# Patient Record
Sex: Female | Born: 1970 | Race: Black or African American | Hispanic: No | Marital: Single | Smoking: Never smoker
Health system: Southern US, Community
[De-identification: ages and names within clinical notes are randomized; demographics above are authoritative.]

## PROBLEM LIST (undated history)

## (undated) ENCOUNTER — Inpatient Hospital Stay: Admission: EM | Payer: Self-pay | Source: Home / Self Care

## (undated) DIAGNOSIS — K219 Gastro-esophageal reflux disease without esophagitis: Secondary | ICD-10-CM

## (undated) DIAGNOSIS — K8020 Calculus of gallbladder without cholecystitis without obstruction: Secondary | ICD-10-CM

## (undated) DIAGNOSIS — F419 Anxiety disorder, unspecified: Secondary | ICD-10-CM

## (undated) DIAGNOSIS — E119 Type 2 diabetes mellitus without complications: Secondary | ICD-10-CM

## (undated) DIAGNOSIS — R42 Dizziness and giddiness: Secondary | ICD-10-CM

## (undated) DIAGNOSIS — I10 Essential (primary) hypertension: Secondary | ICD-10-CM

## (undated) DIAGNOSIS — D649 Anemia, unspecified: Secondary | ICD-10-CM

## (undated) DIAGNOSIS — Z9289 Personal history of other medical treatment: Secondary | ICD-10-CM

## (undated) DIAGNOSIS — T8859XA Other complications of anesthesia, initial encounter: Secondary | ICD-10-CM

## (undated) DIAGNOSIS — K529 Noninfective gastroenteritis and colitis, unspecified: Secondary | ICD-10-CM

## (undated) DIAGNOSIS — E785 Hyperlipidemia, unspecified: Secondary | ICD-10-CM

## (undated) DIAGNOSIS — E079 Disorder of thyroid, unspecified: Secondary | ICD-10-CM

## (undated) HISTORY — DX: Type 2 diabetes mellitus without complications: E11.9

## (undated) HISTORY — DX: Noninfective gastroenteritis and colitis, unspecified: K52.9

## (undated) HISTORY — PX: MYOMECTOMY: SHX85

## (undated) HISTORY — DX: Hyperlipidemia, unspecified: E78.5

## (undated) HISTORY — DX: Anxiety disorder, unspecified: F41.9

## (undated) HISTORY — DX: Dizziness and giddiness: R42

## (undated) HISTORY — DX: Gastro-esophageal reflux disease without esophagitis: K21.9

## (undated) HISTORY — DX: Calculus of gallbladder without cholecystitis without obstruction: K80.20

## (undated) HISTORY — DX: Personal history of other medical treatment: Z92.89

---

## 1998-09-19 ENCOUNTER — Ambulatory Visit (HOSPITAL_COMMUNITY): Admission: RE | Admit: 1998-09-19 | Discharge: 1998-09-19 | Payer: Self-pay | Admitting: *Deleted

## 2001-07-18 ENCOUNTER — Other Ambulatory Visit: Admission: RE | Admit: 2001-07-18 | Discharge: 2001-07-18 | Payer: Self-pay | Admitting: Obstetrics and Gynecology

## 2001-08-14 ENCOUNTER — Ambulatory Visit (HOSPITAL_COMMUNITY): Admission: RE | Admit: 2001-08-14 | Discharge: 2001-08-14 | Payer: Self-pay | Admitting: Internal Medicine

## 2001-09-15 ENCOUNTER — Encounter (INDEPENDENT_AMBULATORY_CARE_PROVIDER_SITE_OTHER): Payer: Self-pay | Admitting: Specialist

## 2001-09-15 ENCOUNTER — Inpatient Hospital Stay (HOSPITAL_COMMUNITY): Admission: RE | Admit: 2001-09-15 | Discharge: 2001-09-17 | Payer: Self-pay | Admitting: Obstetrics and Gynecology

## 2001-12-16 ENCOUNTER — Ambulatory Visit (HOSPITAL_COMMUNITY): Admission: RE | Admit: 2001-12-16 | Discharge: 2001-12-16 | Payer: Self-pay | Admitting: Obstetrics and Gynecology

## 2001-12-16 ENCOUNTER — Encounter: Payer: Self-pay | Admitting: Obstetrics and Gynecology

## 2002-07-21 ENCOUNTER — Other Ambulatory Visit: Admission: RE | Admit: 2002-07-21 | Discharge: 2002-07-21 | Payer: Self-pay | Admitting: Obstetrics and Gynecology

## 2003-01-11 ENCOUNTER — Emergency Department (HOSPITAL_COMMUNITY): Admission: EM | Admit: 2003-01-11 | Discharge: 2003-01-11 | Payer: Self-pay | Admitting: Emergency Medicine

## 2003-02-25 ENCOUNTER — Ambulatory Visit (HOSPITAL_COMMUNITY): Admission: RE | Admit: 2003-02-25 | Discharge: 2003-02-25 | Payer: Self-pay | Admitting: Chiropractor

## 2003-02-25 ENCOUNTER — Encounter: Payer: Self-pay | Admitting: Chiropractor

## 2003-07-28 ENCOUNTER — Other Ambulatory Visit: Admission: RE | Admit: 2003-07-28 | Discharge: 2003-07-28 | Payer: Self-pay | Admitting: Obstetrics and Gynecology

## 2004-09-22 ENCOUNTER — Ambulatory Visit (HOSPITAL_COMMUNITY): Admission: RE | Admit: 2004-09-22 | Discharge: 2004-09-22 | Payer: Self-pay | Admitting: Obstetrics and Gynecology

## 2004-10-31 ENCOUNTER — Encounter: Admission: RE | Admit: 2004-10-31 | Discharge: 2004-10-31 | Payer: Self-pay | Admitting: Obstetrics and Gynecology

## 2004-12-05 ENCOUNTER — Inpatient Hospital Stay (HOSPITAL_COMMUNITY): Admission: AD | Admit: 2004-12-05 | Discharge: 2004-12-05 | Payer: Self-pay | Admitting: Obstetrics and Gynecology

## 2004-12-26 ENCOUNTER — Inpatient Hospital Stay (HOSPITAL_COMMUNITY): Admission: AD | Admit: 2004-12-26 | Discharge: 2004-12-26 | Payer: Self-pay | Admitting: Obstetrics and Gynecology

## 2005-01-14 ENCOUNTER — Inpatient Hospital Stay (HOSPITAL_COMMUNITY): Admission: AD | Admit: 2005-01-14 | Discharge: 2005-01-17 | Payer: Self-pay | Admitting: Obstetrics & Gynecology

## 2005-01-14 ENCOUNTER — Encounter (INDEPENDENT_AMBULATORY_CARE_PROVIDER_SITE_OTHER): Payer: Self-pay | Admitting: Specialist

## 2005-01-21 ENCOUNTER — Emergency Department (HOSPITAL_COMMUNITY): Admission: EM | Admit: 2005-01-21 | Discharge: 2005-01-21 | Payer: Self-pay | Admitting: Emergency Medicine

## 2005-01-22 ENCOUNTER — Inpatient Hospital Stay (HOSPITAL_COMMUNITY): Admission: AD | Admit: 2005-01-22 | Discharge: 2005-01-24 | Payer: Self-pay | Admitting: Obstetrics and Gynecology

## 2006-03-20 ENCOUNTER — Emergency Department (HOSPITAL_COMMUNITY): Admission: EM | Admit: 2006-03-20 | Discharge: 2006-03-20 | Payer: Self-pay | Admitting: Emergency Medicine

## 2009-01-26 ENCOUNTER — Encounter: Admission: RE | Admit: 2009-01-26 | Discharge: 2009-01-26 | Payer: Self-pay | Admitting: Sports Medicine

## 2009-02-09 ENCOUNTER — Encounter: Admission: RE | Admit: 2009-02-09 | Discharge: 2009-02-09 | Payer: Self-pay | Admitting: Family Medicine

## 2009-12-14 ENCOUNTER — Ambulatory Visit: Payer: Self-pay | Admitting: Internal Medicine

## 2009-12-14 DIAGNOSIS — K219 Gastro-esophageal reflux disease without esophagitis: Secondary | ICD-10-CM

## 2009-12-14 DIAGNOSIS — I10 Essential (primary) hypertension: Secondary | ICD-10-CM | POA: Insufficient documentation

## 2009-12-14 DIAGNOSIS — E1165 Type 2 diabetes mellitus with hyperglycemia: Secondary | ICD-10-CM | POA: Insufficient documentation

## 2009-12-14 DIAGNOSIS — E039 Hypothyroidism, unspecified: Secondary | ICD-10-CM

## 2009-12-14 DIAGNOSIS — D649 Anemia, unspecified: Secondary | ICD-10-CM

## 2009-12-14 DIAGNOSIS — E119 Type 2 diabetes mellitus without complications: Secondary | ICD-10-CM

## 2009-12-14 LAB — CONVERTED CEMR LAB
BUN: 5 mg/dL — ABNORMAL LOW (ref 6–23)
Basophils Absolute: 0 10*3/uL (ref 0.0–0.1)
Bilirubin, Direct: 0.1 mg/dL (ref 0.0–0.3)
CO2: 30 meq/L (ref 19–32)
Chloride: 105 meq/L (ref 96–112)
Creatinine, Ser: 0.8 mg/dL (ref 0.4–1.2)
Eosinophils Absolute: 0 10*3/uL (ref 0.0–0.7)
Folate: 7 ng/mL
Hemoglobin, Urine: NEGATIVE
Ketones, ur: NEGATIVE mg/dL
MCHC: 31.9 g/dL (ref 30.0–36.0)
MCV: 75 fL — ABNORMAL LOW (ref 78.0–100.0)
Monocytes Absolute: 0.4 10*3/uL (ref 0.1–1.0)
Neutrophils Relative %: 72.9 % (ref 43.0–77.0)
Platelets: 409 10*3/uL — ABNORMAL HIGH (ref 150.0–400.0)
Saturation Ratios: 5.3 % — ABNORMAL LOW (ref 20.0–50.0)
Total Bilirubin: 0.3 mg/dL (ref 0.3–1.2)
Transferrin: 281.6 mg/dL (ref 212.0–360.0)
Urine Glucose: NEGATIVE mg/dL
Urobilinogen, UA: 0.2 (ref 0.0–1.0)
Vitamin B-12: 296 pg/mL (ref 211–911)
WBC: 8.5 10*3/uL (ref 4.5–10.5)

## 2009-12-16 ENCOUNTER — Telehealth: Payer: Self-pay | Admitting: Internal Medicine

## 2010-02-10 IMAGING — MG MM DIAGNOSTIC BILATERAL
7 series · 7 of 7 positions shown · non-contrast
Comparison: None

CLINICAL DATA: The patient feels a lump in the right axilla.  The
patient's physician felt a lump in the left axilla.

DIGITAL DIAGNOSTIC  BILATERAL  MAMMOGRAM  WITH CAD AND BILATERAL
BREAST ULTRASOUND:

[R CC]
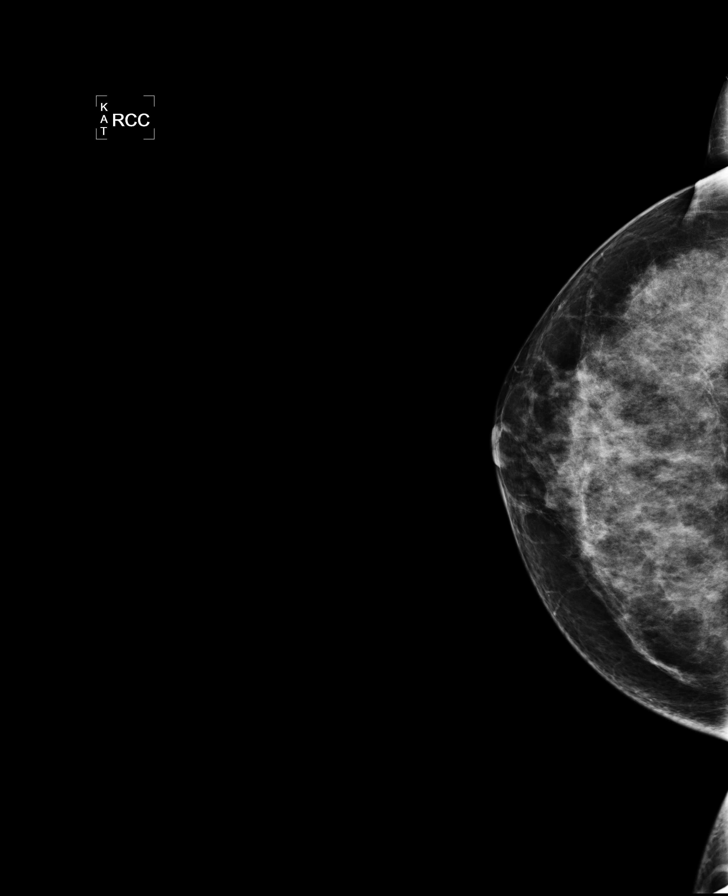

[L CC]
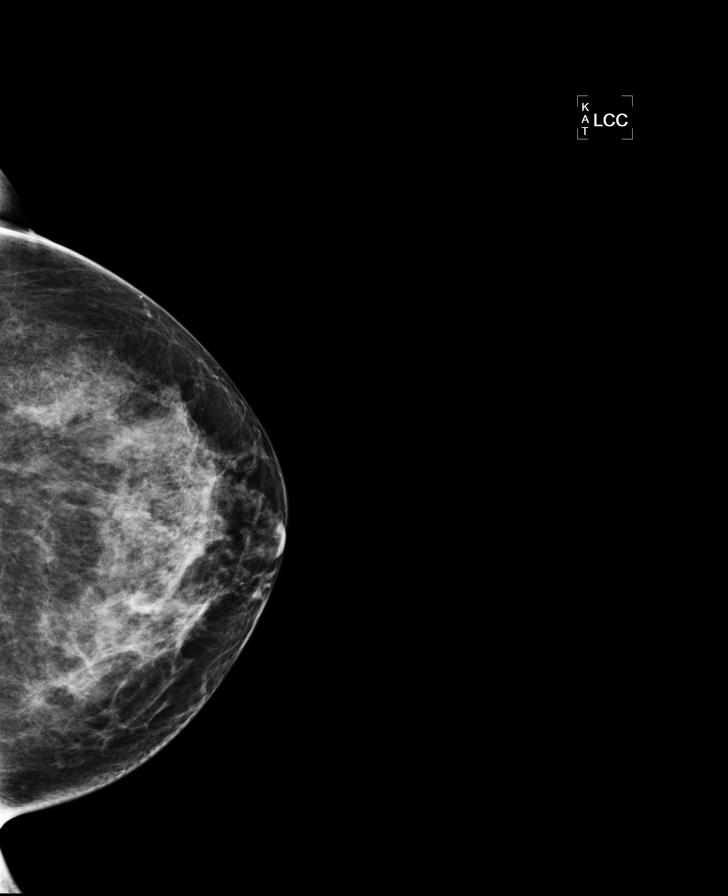

[L MLO (1 of 2)]
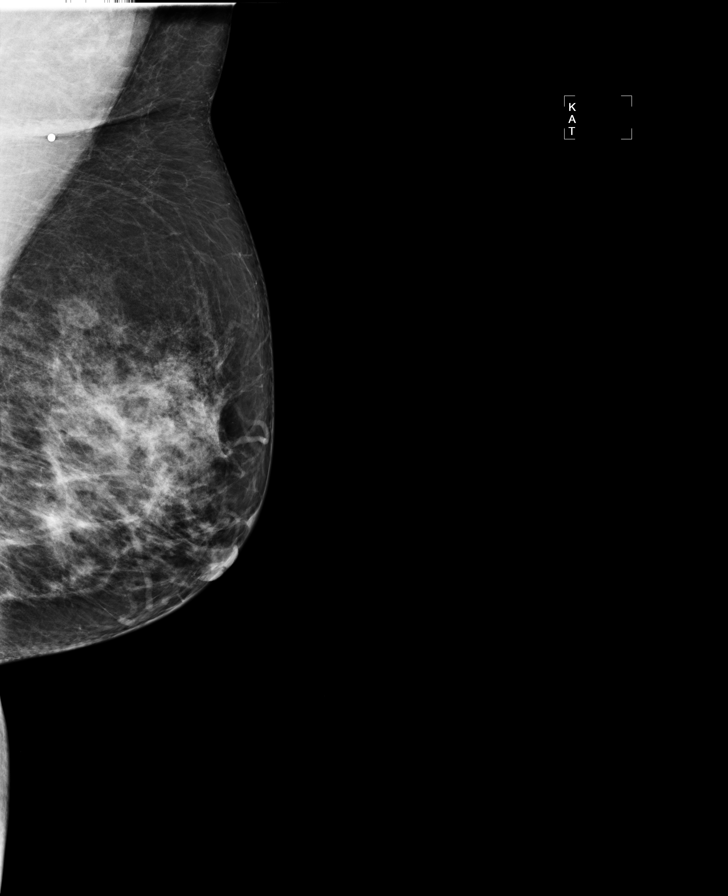

[R MLO]
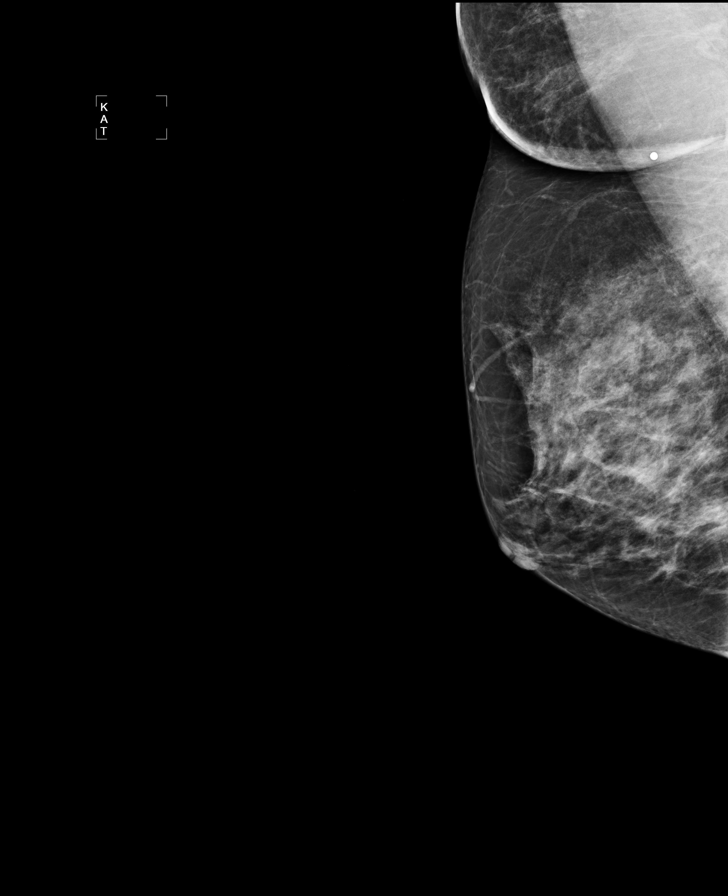

[L TAN]
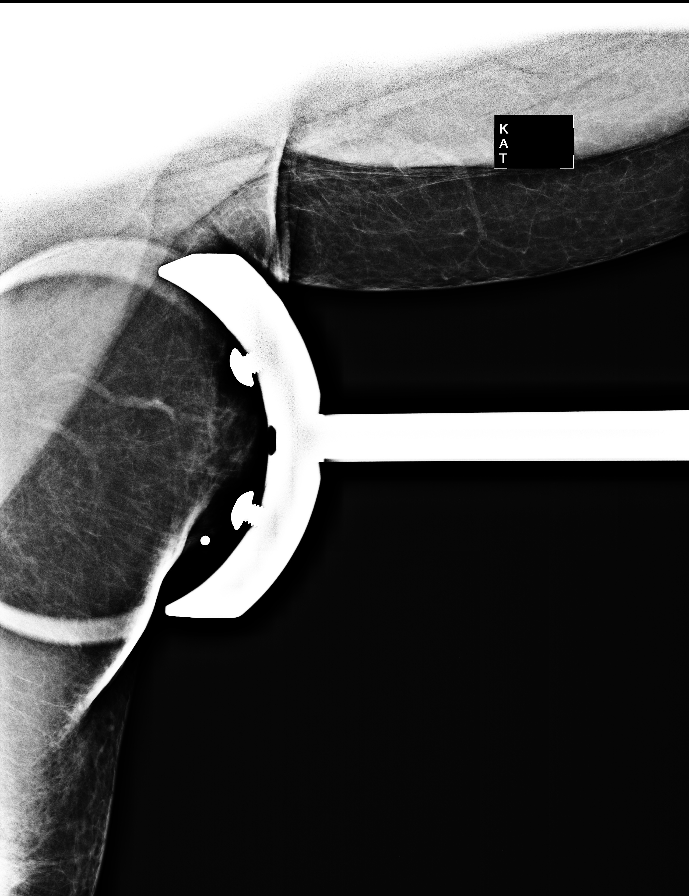

[R TAN]
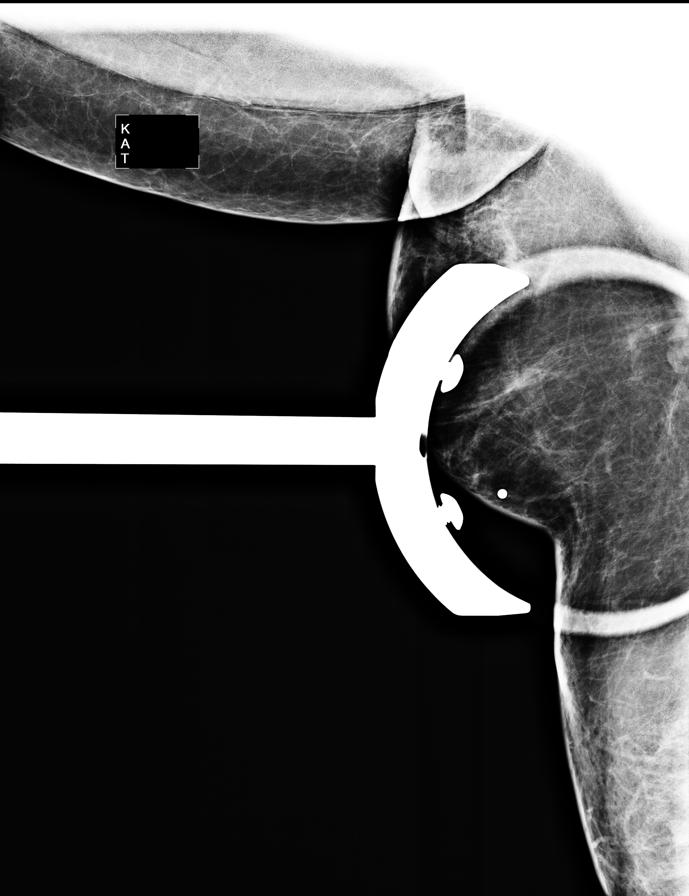

[L MLO (2 of 2)]
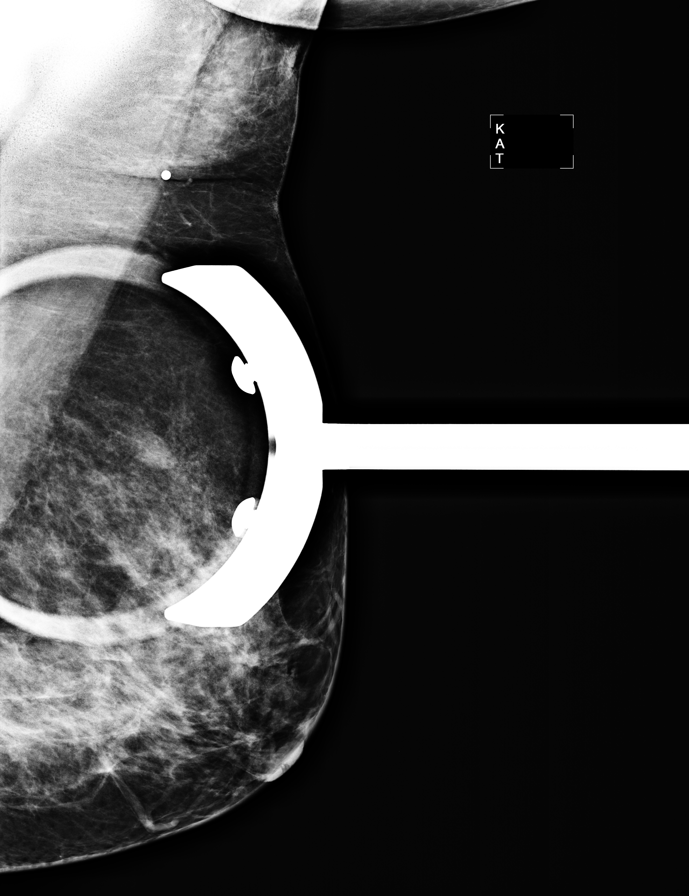

[7 of 7 positions shown; findings below may reference images not displayed]

FINDINGS: There are scattered fibroglandular densities.  There is
an oval nodule in the left upper outer quadrant.  No other
abnormality is noted.  In particular, spot compression views of the
areas of concern in each axilla demonstrates fatty tissue.

On physical exam, no mass is palpated in either axilla or in the
left upper outer quadrant.

Ultrasound is performed, showing fatty tissue in each axilla with
no mass, distortion or shadowing to suggest malignancy.  There is a
small cyst at 2 o'clock 7 cm from the left nipple measuring 1.2 x
0.6 x 1.1 cm.
IMPRESSION: No mammographic or sonographic evidence malignancy.  Yearly
screening mammography should begin at age 40 unless clinically
indicated earlier.

BI-RADS CATEGORY 2:  Benign finding(s).

## 2010-06-13 ENCOUNTER — Ambulatory Visit (HOSPITAL_COMMUNITY): Admission: RE | Admit: 2010-06-13 | Discharge: 2010-06-13 | Payer: Self-pay | Admitting: Diagnostic Radiology

## 2010-11-14 NOTE — Assessment & Plan Note (Signed)
Summary: NEW [employer] PT--PKG/OFF--STC   Vital Signs:  Patient profile:   40 year old female Menstrual status:  regular LMP:     11/30/2009 Height:      66 inches Weight:      190 pounds BMI:     30.78 O2 Sat:      99 % on Room air Temp:     98.7 degrees F oral Pulse rate:   88 / minute Pulse rhythm:   regular Resp:     16 per minute BP sitting:   122 / 88  (left arm) Cuff size:   large  Vitals Entered By: Rock Nephew CMA (December 14, 2009 10:05 AM)  Nutrition Counseling: Patient's BMI is greater than 25 and therefore counseled on weight management options.  O2 Flow:  Room air  Primary Care Provider:  Etta Grandchild MD  CC:  CPX w/no PAP, Hypertension Management, Abdominal Pain, and Preventive Care.  History of Present Illness: New to me she complains of several month hx. of dry mouth, inc. thirst, frequent urination,  blurry vision and numbness in fingers and toes. She ran out of thyroid medication several months ago.  Dyspepsia History:      She has no alarm features of dyspepsia including no history of melena, hematochezia, dysphagia, persistent vomiting, or involuntary weight loss > 5%.  There is a prior history of GERD.  The patient does not have a prior history of documented ulcer disease.  The dominant symptom is heartburn or acid reflux.  An H-2 blocker medication is not currently being taken.    Hypertension History:      She denies headache, chest pain, palpitations, dyspnea with exertion, orthopnea, peripheral edema, visual symptoms, and syncope.        Positive major cardiovascular risk factors include diabetes and hypertension.  Negative major cardiovascular risk factors include female age less than 49 years old, no history of hyperlipidemia, negative family history for ischemic heart disease, and non-tobacco-user status.        Further assessment for target organ damage reveals no history of ASHD, cardiac end-organ damage (CHF/LVH), stroke/TIA, peripheral vascular  disease, renal insufficiency, or hypertensive retinopathy.      Preventive Screening-Counseling & Management  Alcohol-Tobacco     Alcohol drinks/day: <1     Alcohol type: spirits     >5/day in last 3 mos: no     Alcohol Counseling: not indicated; use of alcohol is not excessive or problematic     Feels need to cut down: no     Feels annoyed by complaints: no     Feels guilty re: drinking: no     Needs 'eye opener' in am: no     Smoking Status: never  Caffeine-Diet-Exercise     Does Patient Exercise: yes      Sexual History:  not active.        Drug Use:  no.        Blood Transfusions:  no.    Allergies (verified): 1)  ! Biaxin  Past History:  Past Medical History: Anemia-NOS Diabetes mellitus, type II GERD Hypertension Hypothyroidism Palpitations with negative workup at [facility] in 2010  Past Surgical History: Caesarean section Myomectomy  Family History: Family History of Arthritis Family History Hypertension  Social History: Occupation: Neurosurgeon Single Never Smoked Alcohol use-yes Drug use-no Regular exercise-yes Smoking Status:  never Sexual History:  not active Blood Transfusions:  no Drug Use:  no Does Patient Exercise:  yes  Review  of Systems       The patient complains of weight gain.  The patient denies anorexia, fever, weight loss, chest pain, syncope, dyspnea on exertion, peripheral edema, prolonged cough, headaches, hemoptysis, abdominal pain, melena, hematochezia, severe indigestion/heartburn, hematuria, difficulty walking, depression, and angioedema.   CV:  Denies chest pain or discomfort, fainting, fatigue, near fainting, palpitations, shortness of breath with exertion, and swelling of feet. MS:  Denies joint pain, joint redness, joint swelling, low back pain, mid back pain, muscle aches, cramps, and muscle weakness. Neuro:  Complains of numbness; denies brief paralysis, difficulty with concentration,  disturbances in coordination, falling down, headaches, memory loss, poor balance, seizures, sensation of room spinning, tingling, tremors, and visual disturbances. Endo:  Complains of excessive hunger, excessive thirst, excessive urination, polyuria, and weight change; denies cold intolerance and heat intolerance. Heme:  Denies abnormal bruising, bleeding, enlarge lymph nodes, fevers, pallor, and skin discoloration.  Physical Exam  General:  alert, well-developed, well-nourished, well-hydrated, appropriate dress, normal appearance, healthy-appearing, cooperative to examination, and good hygiene.   Head:  normocephalic, atraumatic, no abnormalities observed, and no abnormalities palpated.   Eyes:  vision grossly intact, pupils equal, pupils round, pupils reactive to light, pupils react to accomodation, no retinal abnormalitiies, and no nystagmus.   Ears:  R ear normal and L ear normal.   Mouth:  Oral mucosa and oropharynx without lesions or exudates.  Teeth in good repair. Neck:  supple, full ROM, no masses, no thyromegaly, no thyroid nodules or tenderness, no JVD, no carotid bruits, no cervical lymphadenopathy, and no neck tenderness.   Lungs:  normal respiratory effort, no intercostal retractions, no accessory muscle use, normal breath sounds, no dullness, no fremitus, no crackles, and no wheezes.   Heart:  normal rate, regular rhythm, no murmur, no gallop, no rub, and no JVD.   Abdomen:  soft, non-tender, normal bowel sounds, no distention, no masses, no guarding, no rigidity, no rebound tenderness, no hepatomegaly, and no splenomegaly.   Msk:  No deformity or scoliosis noted of thoracic or lumbar spine.   Pulses:  R and L carotid,radial,femoral,dorsalis pedis and posterior tibial pulses are full and equal bilaterally Extremities:  No clubbing, cyanosis, edema, or deformity noted with normal full range of motion of all joints.   Neurologic:  No cranial nerve deficits noted. Station and gait are  normal. Plantar reflexes are down-going bilaterally. DTRs are symmetrical throughout. Sensory, motor and coordinative functions appear intact. Skin:  turgor normal, color normal, no rashes, no suspicious lesions, no ecchymoses, no petechiae, no purpura, no ulcerations, no edema, and tattoo(s).   Cervical Nodes:  no anterior cervical adenopathy and no posterior cervical adenopathy.   Axillary Nodes:  no R axillary adenopathy and no L axillary adenopathy.   Inguinal Nodes:  no R inguinal adenopathy and no L inguinal adenopathy.   Psych:  Cognition and judgment appear intact. Alert and cooperative with normal attention span and concentration. No apparent delusions, illusions, hallucinations  Diabetes Management Exam:    Foot Exam (with socks and/or shoes not present):       Sensory-Pinprick/Light touch:          Left medial foot (L-4): normal          Left dorsal foot (L-5): normal          Left lateral foot (S-1): normal          Right medial foot (L-4): normal          Right dorsal foot (L-5): normal  Right lateral foot (S-1): normal       Sensory-Monofilament:          Left foot: normal          Right foot: normal       Inspection:          Left foot: normal          Right foot: normal       Nails:          Left foot: normal          Right foot: normal   Impression & Recommendations:  Problem # 1:  HYPOTHYROIDISM (ICD-244.9) Assessment Unchanged  Orders: Venipuncture (40347) TLB-BMP (Basic Metabolic Panel-BMET) (80048-METABOL) TLB-CBC Platelet - w/Differential (85025-CBCD) TLB-Hepatic/Liver Function Pnl (80076-HEPATIC) TLB-TSH (Thyroid Stimulating Hormone) (84443-TSH) TLB-B12 + Folate Pnl (42595_63875-I43/PIR) TLB-IBC Pnl (Iron/FE;Transferrin) (83550-IBC) TLB-A1C / Hgb A1C (Glycohemoglobin) (83036-A1C) TLB-Udip w/ Micro (81001-URINE)  Problem # 2:  HYPERTENSION (ICD-401.9) Assessment: Improved  Orders: Venipuncture (51884) TLB-BMP (Basic Metabolic Panel-BMET)  (80048-METABOL) TLB-CBC Platelet - w/Differential (85025-CBCD) TLB-Hepatic/Liver Function Pnl (80076-HEPATIC) TLB-TSH (Thyroid Stimulating Hormone) (84443-TSH) TLB-B12 + Folate Pnl (16606_30160-F09/NAT) TLB-IBC Pnl (Iron/FE;Transferrin) (83550-IBC) TLB-A1C / Hgb A1C (Glycohemoglobin) (83036-A1C) TLB-Udip w/ Micro (81001-URINE)  BP today: 122/88  10 Yr Risk Heart Disease: Not enough information  Problem # 3:  DIABETES MELLITUS, TYPE II (ICD-250.00) Assessment: Unchanged  Orders: Venipuncture (55732) TLB-BMP (Basic Metabolic Panel-BMET) (80048-METABOL) TLB-CBC Platelet - w/Differential (85025-CBCD) TLB-Hepatic/Liver Function Pnl (80076-HEPATIC) TLB-TSH (Thyroid Stimulating Hormone) (84443-TSH) TLB-B12 + Folate Pnl (20254_27062-B76/EGB) TLB-IBC Pnl (Iron/FE;Transferrin) (83550-IBC) TLB-A1C / Hgb A1C (Glycohemoglobin) (83036-A1C) TLB-Udip w/ Micro (81001-URINE)  Problem # 4:  ANEMIA-NOS (ICD-285.9) Assessment: Unchanged  Orders: Venipuncture (15176) TLB-BMP (Basic Metabolic Panel-BMET) (80048-METABOL) TLB-CBC Platelet - w/Differential (85025-CBCD) TLB-Hepatic/Liver Function Pnl (80076-HEPATIC) TLB-TSH (Thyroid Stimulating Hormone) (84443-TSH) TLB-B12 + Folate Pnl (16073_71062-I94/WNI) TLB-IBC Pnl (Iron/FE;Transferrin) (83550-IBC) TLB-A1C / Hgb A1C (Glycohemoglobin) (83036-A1C) TLB-Udip w/ Micro (81001-URINE)  Hypertension Assessment/Plan:      The patient's hypertensive risk group is category C: Target organ damage and/or diabetes.  Today's blood pressure is 122/88.  Her blood pressure goal is < 130/80.  Colorectal Screening:  Colonoscopy Results:    Date of Exam: 02/19/2007    Results: Normal  PAP Screening:    Hx Cervical Dysplasia in last 5 yrs? No    3 normal PAP smears in last 5 yrs? Yes    Last PAP smear:  05/08/2009  Osteoporosis Risk Assessment:  Risk Factors for Fracture or Low Bone Density:   Smoking status:       never   Thyroid disease:      yes  Immunization & Chemoprophylaxis:    Tetanus vaccine: Historical  (10/16/1999)  Patient Instructions: 1)  Please schedule a follow-up appointment in 2 weeks. 2)  It is important that you exercise regularly at least 20 minutes 5 times a week. If you develop chest pain, have severe difficulty breathing, or feel very tired , stop exercising immediately and seek medical attention. 3)  You need to lose weight. Consider a lower calorie diet and regular exercise.   Preventive Care Screening  Pap Smear:    Date:  05/08/2009    Results:  normal   Last Tetanus Booster:    Date:  10/16/1999    Results:  Historical     Not Administered:    Influenza Vaccine not given due to: declined CC: CPX w/no PAP, Hypertension Management, Abdominal Pain, Preventive Care Is Patient Diabetic? No Pain Assessment Patient in pain? no  LMP (date): 11/30/2009     Menstrual Status regular Enter LMP: 11/30/2009 Last PAP Result normal    Prevention & Chronic Care Immunizations   Influenza vaccine: Not documented    Tetanus booster: 10/16/1999: Historical    Pneumococcal vaccine: Not documented  Other Screening   Pap smear: normal  (05/08/2009)   Smoking status: never  (12/14/2009)  Diabetes Mellitus   HgbA1C: Not documented    Eye exam: Not documented    Foot exam: yes  (12/14/2009)   High risk foot: Not documented   Foot care education: Not documented    Urine microalbumin/creatinine ratio: Not documented   Hypertension   Last Blood Pressure: 122 / 88  (12/14/2009)   Serum creatinine: Not documented   Serum potassium Not documented  Self-Management Support :    Diabetes self-management support: Not documented    Hypertension self-management support: Not documented

## 2010-11-14 NOTE — Letter (Signed)
Summary: Results Follow-up Letter  [facility]  [address] [place], [place] [address]   Phone: [phone]  Fax: [phone]    12/14/2009  [address] [place], [place]  [address]  Dear Ms. Schram,   The following are the results of your recent test(s):  Test     Result     Blood sugars   normal B12 level     normal Thyroid     normal Iron level     low CBC       anemia Liver/kidney   normal   _________________________________________________________  Please call for an appointment as directed _________________________________________________________ _________________________________________________________ _________________________________________________________  Sincerely,  Sanda Linger MD [facility]

## 2010-11-14 NOTE — Progress Notes (Signed)
Summary: Next apt  Phone Note Call from Patient   Summary of Call: Pt called req lab results. Informed her of results in letter. She wants to know why the Dr needs to see her again in 2 weeks. Pt says she can not afford a copay every 2 wks for f/u. Please advise, when does pt need next office visit?  Initial call taken by: Lamar Sprinkles, CMA,  December 16, 2009 10:35 AM  Follow-up for Phone Call        the f/up was to prescribe iron tabs. she can avoid the visit by seeing a pharmacist for iron recommendations Follow-up by: Etta Grandchild MD,  December 16, 2009 10:39 AM  Additional Follow-up for Phone Call Additional follow up Details #1::        Pt informed  Additional Follow-up by: Lamar Sprinkles, CMA,  December 16, 2009 10:44 AM

## 2011-02-07 ENCOUNTER — Encounter: Payer: BC Managed Care – PPO | Attending: Emergency Medicine

## 2011-02-07 DIAGNOSIS — Z713 Dietary counseling and surveillance: Secondary | ICD-10-CM | POA: Insufficient documentation

## 2011-02-07 DIAGNOSIS — E119 Type 2 diabetes mellitus without complications: Secondary | ICD-10-CM | POA: Insufficient documentation

## 2011-03-02 NOTE — Discharge Summary (Signed)
Janet Marquez, Janet Marquez             ACCOUNT NO.:  [id number]   MEDICAL RECORD NO.:  [id number]          PATIENT TYPE:  INP   LOCATION:  9107                          FACILITY:  [facility]   PHYSICIAN:  Genia Del, M.D.DATE OF BIRTH:  November 01, 1970   DATE OF ADMISSION:  01/14/2005  DATE OF DISCHARGE:  01/17/2005                                 DISCHARGE SUMMARY   ADMISSION DIAGNOSES:  1.  A 38-plus weeks' gestation.  2.  Spontaneous rupture of membranes.  3.  History of myomectomy.   DISCHARGE DIAGNOSES:  1.  A 38-plus weeks' gestation.  2.  Spontaneous rupture of membranes.  3.  History of myomectomy.  4.  Birth of a baby boy by cesarean section.   INTERVENTION:  Urgent primary low transverse cesarean section on January 14, 2005.   HOSPITAL COURSE:  The patient was admitted with spontaneous rupture of  membranes, having had a previous myomectomy.  A cesarean section was  planned.  The patient had an urgent primary low transverse cesarean section  with birth of a baby boy.  No complications.  The postop evolution was  unremarkable.  Her blood pressures remained stable and she remained  afebrile.  Postop hemoglobin was 7.1 with a hematocrit of 21.5.  the patient  was discharged home with iron supplement, Tylox p.r.n. for pain.  Postop  advice was given.  She will follow up at [facility] in 4 weeks.       ML/MEDQ  D:  03/12/2005  T:  03/12/2005  Job:  [id number]

## 2011-03-02 NOTE — Op Note (Signed)
Janet Marquez, Janet Marquez             ACCOUNT NO.:  [id number]   MEDICAL RECORD NO.:  [id number]          PATIENT TYPE:  INP   LOCATION:  9107                          FACILITY:  [facility]   PHYSICIAN:  Genia Del, M.D.DATE OF BIRTH:  [birth year]   DATE OF PROCEDURE:  01/14/2005  DATE OF DISCHARGE:                                 OPERATIVE REPORT   PREOPERATIVE DIAGNOSIS:  38+ weeks gestation, spontaneous rupture of  membranes, history of myomectomy.   POSTOPERATIVE DIAGNOSIS:  38+ weeks gestation, spontaneous rupture of  membranes, history of myomectomy.   PROCEDURE:  Urgent primary low transverse cesarean section.   SURGEON:  Genia Del, M.D.   ASSISTANT:  Maxie Better, M.D.   ANESTHESIA:  Dorinda Hill T. Pamalee Leyden, M.D.   DESCRIPTION OF PROCEDURE:  Under spinal anesthesia, the patient is in 15  degree left decubitus position.  She is prepped with Hibiclens with Betadine  on the abdominal, suprapubic, vulvar, and vaginal areas.  The bladder  catheter was inserted and the patient is draped as usual.  The level of  anesthesia is verified and is adequate.  We infiltrated the subcutaneous  tissue with Marcaine 0.25% plain 20 mL at the level of the previous scar  from the myomectomy.  We then resected the old scar with the scalpel and  opened the adipose tissue and the aponeurosis with the electrocautery  cutting mode.  We then extended that incision on the aponeurosis laterally  with Mayo scissors.  We separated the rectus muscles from the aponeurosis in  the midline superiorly and inferiorly.  We then opened the parietoperitoneum  longitudinally with Metzenbaum scissors.  We then opened the  visceroperitoneum transversely with Metzenbaum scissors over the lower  uterine segment and reclined the bladder downward.  The bladder retractor is  used at that time.  We then opened a hysterotomy over the lower uterine  segment transversely with the scalpel and this is extended on each  side with  the dressing scissors.  We have clear amniotic fluid.  The fetus is in  cephalic presentation, occipitoposterior.  Birth of a baby boy at 76.  The  cord is clamped and cut.  The baby is given to the neonatal team after  suctioning.  Apgars are 9 and 9.  Weight is 10 pounds 13 ounces.  We then  delivered the placenta spontaneously and sent it to pathology after taking  the cord blood.  We did a uterine revision.  The uterus contracts well with  Pitocin IV.  A dose of penicillin-G was given before the surgery because of  Group B Strep positive and rupture of membranes.  We now gave a dose of  Ancef 1 gram IV.  We closed the hysterotomy in first a full locked running  plain of Vicryl 0, a second plain in a mattress fashion is done with Vicryl  0.  Hemostasis is completed with a Vicryl 3-0 over a small vein in the  middle inferior border of the hysterotomy.  We then verified both ovaries  and both tubes which are normal in size and appearance.  The uterus is well  contracted, a little larger than average, but no distinct myoma is felt.  We  then irrigated the abdominopelvic cavity and removed any blood clots.  The  irrigation is suctioned.  Hemostasis is adequate on the aponeurosis and  rectus muscles as well as at the level of the bladder flap.  We closed the  aponeurosis with two half-running sutures of Vicryl 0.  The adipose tissue  is  verified and hemostasis is controlled with the electrocautery.  We then  reapproximated the skin with staples.  A dry dressing is applied.  The count  of sponges and instruments was complete x2.  The estimated blood loss was  800 mL.  No complications occurred and the patient was brought to the  recovery room in good status.      ML/MEDQ  D:  01/14/2005  T:  01/15/2005  Job:  [id number]

## 2011-03-02 NOTE — Op Note (Signed)
[facility]  Patient:    Janet Marquez, Janet Marquez Visit Number: [id number] MRN: [id number]          Service Type: GYN Location: 9300 [id number] Attending Physician:  Maxie Better Dictated by:   Sheria Lang. Cherly Hensen, M.D. Proc. Date: 09/15/01 Admit Date:  09/15/2001                             Operative Report  PREOPERATIVE DIAGNOSIS:       Degenerating uterine fibroid.  POSTOPERATIVE DIAGNOSIS:      Degenerating uterine fibroid.  OPERATION:                    Examination under anesthesia, exploratory                               laparotomy, myomectomy.  SURGEON:                      Sheronette A. Cherly Hensen, M.D.  ASSISTANT:                    Marina Gravel, M.D.  ANESTHESIA:                   General.  DESCRIPTION OF PROCEDURE:     Under adequate general anesthesia, the patient was placed in the supine position.  Examination under anesthesia revealed a uterus that was about 14 weeks size with a prominent anterior fibroid.  The patient was then thoroughly prepped including her vagina.  A bivalve speculum was placed in the vagina.  A single tooth tenaculum was placed on the anterior lip of the cervix.  An acorn cannula was introduced into the cervical os and attached to the tenaculum.  Through the acorn, a dilute solution of Indigo Carmine was attached for injection during this surgery.  The bivalve speculum was removed.  An indwelling Foley catheter had been sterilely placed. Antiembolic stockings were in place.  Antibiotic prophylaxis had been given. The patient was then sterilely draped in the usual fashion.  A marking pen was used to then outline the Pfannenstiel skin incision.  Quarter percent Marcaine was injected along this demarcated area, and a Pfannenstiel skin incision was then made, carried down to the rectus fascia using Bovie cautery.  The rectus fascia was incised in the midline and extended transversely.  The rectus fascia was then  bluntly and with cautery, dissected off the rectus muscles in a superior and inferior fashion.  The rectus muscle was split in the midline. Preperitoneal fat was then noted.  Subsequently, the parietal peritoneum was entered, and extended superiorly and inferiorly.  Exploration of the upper abdomen was notable for a normal liver edge, normal palpable kidney.  Normal appendix.  In the pelvis, there was a large, about 8 cm anterior fibroid noted.  Upon palpation of the ovaries, they were both noted to be normal.  The tubes appeared normal.  There was no evidence of endometriosis in the anterior or posterior cul-de-sac.  The bowels were then packed upwardly.  The uterus was exteriorized.  A dilute solution of Pitressin was injected vertically along the fibroid anteriorly.  Using Bovie cautery, a vertical incision was then made overlying the fibroid.  The fibroid was then enucleated from its base using Metzenbaum scissors.  At that point, the Indigo Carmine was injected. However, no evidence of  _________ of the tube or discoloration of the defect from which the fibroid had been removed.  There was however suggestion of the fibroid having a submucosal component.  The palpation of the uterus was then notable for a posterior fundal fibroid about 2.5 cm, but that fibroid could not be reached through the anterior incision.  Therefore, the anterior incision on the uterus was then closed with figure-of-eight 0 Vicryl sutures until the serosal surface was reached, at which time, 2-0 Vicryl was then used to close the incision in a baseball stitch.  Dilute Pitressin was then injected posteriorly overlying the fibroid.  An incision was then made.  The fibroid was enucleated from its base.  A figure-of-eight suture was used to close the dead space posteriorly, and 2-0 Monocryl suture was then used to close the serosa.  Good hemostasis was then noted.  The abdomen was then irrigated and suctioned of  debris.  The packings were removed.  Interceed was placed overlying the incision.  The parietal peritoneum was not closed.  The fascia was closed with 0 Vicryl x 2.  The subcutaneous area was irrigated.  Small bleeders cauterized.  The skin was approximated using Ethicon staples.  The specimen was myoma x 2 sent to pathology.  Estimated blood loss was 50 cc. Intraoperative fluid was 2600 cc of crystalloid.  Urine output was 100 cc, very cloudy urine with particulate matter.  A urine culture sample was sent. Sponge and instrument counts x 2 was correct.  Complications none.  The patient tolerated the procedure well and was transferred to the recovery room in stable condition.  It was noted as with the induction of anesthesia, the patient also had tachycardia.  Preanesthesia, her pulse had been 110, and this will be followed and evaluated postoperatively as well. Dictated by:   Sheria Lang. Cherly Hensen, M.D. Attending Physician:  Maxie Better DD:  09/15/01 TD:  09/16/01 Job: [id number]

## 2011-03-02 NOTE — H&P (Signed)
[facility]  Patient:    Janet Marquez, Janet Marquez Visit Number: [id number] MRN: [id number]          Service Type: Attending:  Sheronette A. Cherly Hensen, M.D. Dictated by:   Sheria Lang. Cherly Hensen, M.D. Adm. Date:  09/15/01                           History and Physical  DATE OF BIRTH:                July 07, 1971  CHIEF COMPLAINT:              Degenerating fibroids.  HISTORY OF PRESENT ILLNESS:   This is a 40 year old, gravida 0, single black female whose last menstrual period is August 31, 2001, who is now being admitted for myomectomy secondary to degenerating fibroids.  The patient underwent an ultrasound on July 18, 2001 secondary to metrorrhagia.  At that time, there was a finding of a 7.8 cm anterior fibroid and questions of a thickened endometrium with possible endometrial polyps.  There was a small left ovarian cyst and a right ovary that was normal.  Sonohysterogram done on August 08, 2001 showed a thickened anterior and posterior wall without any concrete evidence of endometrial polyp.  The patient was first seen on July 07, 2001.  At that time, she was complaining of irregular cycles. The patient reported regular cycles until 1999 to around year 2000.  Then, her cycles began to occur every two weeks.  She was placed on birth control pills for six months and then her cycles became regular after that until June of 2002, at which time, she started to have two menstrual cycles per month.  She also noted that each of these vaginal bleeding had lasted seven days.  There were associated cramps and some dime-sized clots.  The patient has not used any contraception for greater than a year, with the same partner for three years, who also has a child outside of the relationship.  She has no history of thyroid disease and denied any history of pelvic inflammatory disease or sexually transmitted diseases.  The patient subsequently presented on  August 05, 2001 with right lower quadrant pain, which was described as severe, constant, radiating to her lower back.  At the time that she presented, her exam was more consistent with a degenerating fibroid where her pain was localized over the fibroid itself.  The patient was treated with analgesics. She continues to have some degree of right lower quadrant pain overlying the fibroid.  She now presents for surgical management.  ALLERGIES:                    No known drug allergies.  MEDICATIONS:                  None.  PAST MEDICAL HISTORY:         None.  SURGICAL HISTORY:             Wisdom tooth extraction.  FAMILY HISTORY:               Maternal grandfather with diabetes.  Aunt with postmenopausal breast cancer.  Mother has hypertension.  SOCIAL HISTORY:               Single.  She is an eligibility case worker and her significant other is in the [employer].  REVIEW OF SYSTEMS:  Negative, except for history of present illness.  PHYSICAL EXAMINATION:  GENERAL:                      She is a well-developed, well-nourished black female in no acute distress.  VITAL SIGNS:                  Blood pressure 118/84, weight of 184 pounds.  SKIN:                         No lesions.  HEENT:                        Anicteric sclerae, pink conjunctivae. Oropharynx negative.  HEART:                        Regular rate and rhythm, without murmur.  LUNGS:                        Clear to auscultation.  No axillary or supraclavicular nodes palpable.  No inguinal nodes palpable.  BREASTS:                      Soft, nontender.  No palpable mass.  Upward nipples without discharge.  ABDOMEN:                      Soft, nondistended.  Tender to deep palpation in right lower quadrant.  BACK:                         No CVA tenderness.  PELVIC:                       Vulva showed no lesions. Vagina had some white discharge for which wet prep was performed.  Cervix was closed.  Uterus  was anteverted with a palpable 6 cm anterior fundal mass, consistent with a fibroid.  Adnexa was without any palpable mass.  RECTAL:                       Not indicated.  LABORATORY/ACCESSORY DATA:    CBC done on August 05, 2001 showed a hemoglobin of 10.7, hematocrit of 33.1.  Thyroid test on July 18, 2001 showed was normal.  GC and Chlamydia cultures were negative on July 18, 2001.  Pap was normal on July 18, 2001.  Wet prep showed yeast and clue cells.  IMPRESSION:                   Degenerating fibroid.  PLAN:                         1. Admission.                               2. Myomectomy.                               3. Possible chromotubation.                               4. Antibiotics prophylaxis.  5. Anti-embolic stockings.  Risks of the procedure have been explained to the patient, including, but not limited to infection, bleeding which may require blood transfusion, acute reaction.  Hepatitis transmission, HIV transmission all discussed.  Possible need for cesarean section in the future.  Up to 30% chance of her needing a myomectomy or hysterectomy in the future.  Internal scar tissue which may result in secondary infertility, as well as bowel obstruction, possible pelvic pain.  Injuries to surrounding organ structure, including bowel, bladder, intestines, ureter.  The patient was treated for bacterial vaginosis, using Cleocin vaginal ovules and Diflucan for the Candida vaginitis.  Postoperative care and criteria for discharge were all discussed.  All questions answered. Dictated by:   Sheria Lang. Cherly Hensen, M.D. Attending:  Sheronette A. Cherly Hensen, M.D. DD:  09/14/01 TD:  09/14/01 Job: [id number]

## 2011-03-02 NOTE — Discharge Summary (Signed)
[facility]  Patient:    JUDI, JAFFE Visit Number: [id number] MRN: [id number]          Service Type: GYN Location: 9300 [id number] Attending Physician:  Maxie Better Dictated by:   Sheria Lang. Cherly Hensen, M.D. Admit Date:  09/15/2001 Discharge Date: 09/17/2001                             Discharge Summary  ADMISSION DIAGNOSES: 1. Degenerating fibroid. 2. Menorrhagia.  DISCHARGE DIAGNOSES: 1. Degenerating fibroid. 2. Menorrhagia.  PROCEDURE:  Examination under anesthesia, exploratory laparotomy, and myomectomy.  HOSPITAL COURSE:  The patient was admitted as a 40 year old G0, single black female with a large fibroid uterus and abdominal pain related to her fibroids for surgical management.  The patient was taken to the operating room where she underwent an examination under anesthesia, exploratory laparotomy, and myomectomy.  Her procedure was unremarkable.  During her surgery, the patient was noted to have sinus tachycardia.  Evaluation, including thyroid studies postoperatively were normal.  The patient had a normal TSH on July 18, 2001. Urine culture had been sent due to a cloudy urine noted postoperatively, however, the urine culture was subsequently negative.  Her CBC on postoperative day #1, showed a hemoglobin of 9.0, hematocrit of 27.1, white count of 13.  The patient remained afebrile throughout her course.  She tolerated a regular diet, and she was deemed well to be discharged home on postoperative day #2, having passed flatus.  Her incision was without any erythema, induration, or exudate.  It did have staples.  DISPOSITION:  Home.  DISCHARGE INSTRUCTIONS: 1. Call for temperature greater than or equal to 100.4. 2. Nothing per vagina for 4 to 6 weeks. 3. No heavy lifting or driving for two weeks. 4. Call if increased incisional pain, redness, or drainage from the incision    site.  FOLLOWUP:  Staple removal in the  office on 12/6 or 12/9, depending on whether or not there is snow.  DISCHARGE MEDICATIONS: 1. Tylox one or two tablets q.3-4h. p.r.n. pain #25. 2. Motrin 800 mg one p.o. q.6h. p.r.n. pain #30 with two refills. 3. Take an iron supplementation one p.o. q.d.  CONDITION ON DISCHARGE:  Stable. Dictated by:   Sheria Lang. Cherly Hensen, M.D. Attending Physician:  Maxie Better DD:  09/17/01 TD:  09/17/01 Job: [id number]

## 2013-01-07 ENCOUNTER — Other Ambulatory Visit: Payer: Self-pay

## 2013-01-07 DIAGNOSIS — Z1231 Encounter for screening mammogram for malignant neoplasm of breast: Secondary | ICD-10-CM

## 2013-02-02 ENCOUNTER — Ambulatory Visit
Admission: RE | Admit: 2013-02-02 | Discharge: 2013-02-02 | Disposition: A | Discharge status: Home / Self Care | Payer: BC Managed Care – PPO | Source: Ambulatory Visit

## 2013-02-02 DIAGNOSIS — Z1231 Encounter for screening mammogram for malignant neoplasm of breast: Secondary | ICD-10-CM

## 2013-02-03 ENCOUNTER — Other Ambulatory Visit: Payer: Self-pay | Admitting: Internal Medicine

## 2013-02-03 DIAGNOSIS — R928 Other abnormal and inconclusive findings on diagnostic imaging of breast: Secondary | ICD-10-CM

## 2013-02-19 ENCOUNTER — Ambulatory Visit
Admission: RE | Admit: 2013-02-19 | Discharge: 2013-02-19 | Disposition: A | Discharge status: Home / Self Care | Payer: BC Managed Care – PPO | Source: Ambulatory Visit | Attending: Internal Medicine | Admitting: Internal Medicine

## 2013-02-19 DIAGNOSIS — R928 Other abnormal and inconclusive findings on diagnostic imaging of breast: Secondary | ICD-10-CM

## 2013-09-30 ENCOUNTER — Other Ambulatory Visit: Payer: Self-pay | Admitting: Internal Medicine

## 2013-09-30 DIAGNOSIS — N6320 Unspecified lump in the left breast, unspecified quadrant: Secondary | ICD-10-CM

## 2013-10-09 ENCOUNTER — Ambulatory Visit
Admission: RE | Admit: 2013-10-09 | Discharge: 2013-10-09 | Disposition: A | Discharge status: Home / Self Care | Payer: BC Managed Care – PPO | Source: Ambulatory Visit | Attending: Internal Medicine | Admitting: Internal Medicine

## 2013-10-09 DIAGNOSIS — N6320 Unspecified lump in the left breast, unspecified quadrant: Secondary | ICD-10-CM

## 2014-05-11 ENCOUNTER — Other Ambulatory Visit: Payer: Self-pay | Admitting: Internal Medicine

## 2014-05-11 DIAGNOSIS — N6320 Unspecified lump in the left breast, unspecified quadrant: Secondary | ICD-10-CM

## 2014-05-21 ENCOUNTER — Ambulatory Visit
Admission: RE | Admit: 2014-05-21 | Discharge: 2014-05-21 | Disposition: A | Discharge status: Home / Self Care | Payer: BC Managed Care – PPO | Source: Ambulatory Visit | Attending: Internal Medicine | Admitting: Internal Medicine

## 2014-05-21 ENCOUNTER — Encounter (INDEPENDENT_AMBULATORY_CARE_PROVIDER_SITE_OTHER): Payer: Self-pay

## 2014-05-21 DIAGNOSIS — N6320 Unspecified lump in the left breast, unspecified quadrant: Secondary | ICD-10-CM

## 2014-08-07 ENCOUNTER — Telehealth: Payer: Self-pay

## 2014-08-07 ENCOUNTER — Ambulatory Visit (INDEPENDENT_AMBULATORY_CARE_PROVIDER_SITE_OTHER): Payer: BC Managed Care – PPO | Admitting: Family Medicine

## 2014-08-07 VITALS — BP 132/86 | HR 107 | Temp 98.7°F | Resp 18 | Ht 67.0 in | Wt 199.8 lb

## 2014-08-07 DIAGNOSIS — R05 Cough: Secondary | ICD-10-CM

## 2014-08-07 DIAGNOSIS — J0101 Acute recurrent maxillary sinusitis: Secondary | ICD-10-CM

## 2014-08-07 DIAGNOSIS — R058 Other specified cough: Secondary | ICD-10-CM

## 2014-08-07 MED ORDER — RANITIDINE HCL 150 MG PO CAPS: 150.0000 mg | ORAL_CAPSULE | Freq: Every evening | ORAL | Status: DC

## 2014-08-07 MED ORDER — MOMETASONE FUROATE 50 MCG/ACT NA SUSP: 2.0000 | Freq: Every day | NASAL | Status: DC

## 2014-08-07 MED ORDER — LEVOFLOXACIN 500 MG PO TABS: 500.0000 mg | ORAL_TABLET | Freq: Every day | ORAL | Status: DC

## 2014-08-07 NOTE — Telephone Encounter (Signed)
Patient states that her Nasonax is $230.00. Cannot afford. Please advise. [place] [facility] [place]  [phone]

## 2014-08-07 NOTE — Progress Notes (Signed)
This chart was scribed for Tami Lin, MD by Erling Conte, Medical Scribe. This patient was seen in Room 6 and the patient's care was started at 10:01 AM.   Patient ID: Janet Marquez MRN: [id number], DOB: 02-04-1971, 43 y.o. Date of Encounter: 08/07/2014, 9:58 AM  Primary Physician: Scarlette Calico, MD  Chief Complaint:  Chief Complaint  Patient presents with   Headache    facial pressure, x 2-3 weeks   Cough    chest pain with cough, yellow gray cloudy phlegm   Nasal Congestion   Otalgia    more on right side    HPI: 42 y.o. year old female presents with 3 week history of nasal congestion. She has an associated post nasal drip, HA, sinus pressure, otalgia, rhinorrhea, and cough productive of yellow, gray, cloudy mucus. Afebrile. No chills. Nasal congestion thick and green/yellow. Sinus pressure is the worst symptom. Cough is productive secondary to post nasal drip and is greater at night. Ears feel full, right side greater than left. Has tried OTC cold preps without much success.Has tried Flonase in the past with moderate relief. No GI complaints. She has a h/o seasonal allergies. No h/o asthma. She has frequent sinus infections. She denies any sore throat, rash, nausea, or diarrhea.   No recent antibiotics, recent travels, vomiting, or sick contacts   No leg trauma, sedentary periods, h/o cancer, or tobacco use.  Pt works for [employer]  No past medical history on file.   Home Meds: Prior to Admission medications   Not on File    Allergies:  Allergies  Allergen Reactions   Clarithromycin     REACTION: Vomiting Upset GI    History   Social History   Marital Status: Single    Spouse Name: N/A    Number of Children: N/A   Years of Education: N/A   Occupational History   Not on file.   Social History Main Topics   Smoking status: Not on file   Smokeless tobacco: Not on file   Alcohol Use: Not on file   Drug Use: Not on file    Sexual Activity: Not on file   Other Topics Concern   Not on file   Social History Narrative   No narrative on file     Review of Systems: Constitutional: negative for chills, fever, night sweats or weight changes Cardiovascular: negative for chest pain or palpitations HENT: Positive for congestion, sinus pressure, post nasal drip, otalgia (R > L). Negative for sore throat Respiratory: positive for cough, negative for hemoptysis, wheezing, or shortness of breath Abdominal: negative for abdominal pain, nausea, vomiting or diarrhea Dermatological: negative for rash Neurologic: Positive for HA (due to sinus pressure)   Physical Exam: Blood pressure 132/86, pulse 107, temperature 98.7 F (37.1 C), temperature source Oral, resp. rate 18, height 5\' 7"  (1.702 m), weight 199 lb 12.8 oz (90.629 kg), SpO2 100.00%., Body mass index is 31.29 kg/(m^2). General: Well developed, well nourished, in no acute distress. Head: Normocephalic, atraumatic, eyes without discharge, sclera non-icteric, nares are congested. Swollen nasal passages with mucoperulent discharge. Bilateral auditory canals clear, TM's are without perforation, pearly grey with reflective cone of light bilaterally. Serous effusion bilaterally behind TM's. Maxillary sinus TTP. Oral cavity moist, dentition normal. Posterior pharynx with post nasal drip and mild erythema. No peritonsillar abscess or tonsillar exudate.  Neck: Supple. No thyromegaly. Full ROM. No lymphadenopathy. Lungs: Clear bilaterally to auscultation without wheezes, rales, or rhonchi. Breathing is unlabored.  Heart: RRR with  S1 S2. No murmurs, rubs, or gallops appreciated. Msk:  Strength and tone normal for age. Extremities: No clubbing or cyanosis. No edema. Neuro: Alert and oriented X 3. Moves all extremities spontaneously. CNII-XII grossly in tact. Psych:  Responds to questions appropriately with a normal affect.   Labs:   ASSESSMENT AND PLAN:  43 y.o. year old  female with sinusitis -Acute recurrent maxillary sinusitis - Plan: mometasone (NASONEX) 50 MCG/ACT nasal spray, levofloxacin (LEVAQUIN) 500 MG tablet  Nocturnal cough - Plan: ranitidine (ZANTAC) 150 MG capsule    -Tylenol/Motrin prn -Rest/fluids -RTC precautions -RTC 3-5 days if no improvement  Signed, Robyn Haber, MD 08/07/2014 9:58 AM  I personally performed the services described in this documentation, which was scribed in my presence. The recorded information has been reviewed and is accurate.

## 2014-08-07 NOTE — Patient Instructions (Signed)
Take probiotic with the Levaquin Call if nocturnal cough persists   Sinusitis Sinusitis is redness, soreness, and inflammation of the paranasal sinuses. Paranasal sinuses are air pockets within the bones of your face (beneath the eyes, the middle of the forehead, or above the eyes). In healthy paranasal sinuses, mucus is able to drain out, and air is able to circulate through them by way of your nose. However, when your paranasal sinuses are inflamed, mucus and air can become trapped. This can allow bacteria and other germs to grow and cause infection. Sinusitis can develop quickly and last only a short time (acute) or continue over a long period (chronic). Sinusitis that lasts for more than 12 weeks is considered chronic.  CAUSES  Causes of sinusitis include:  Allergies.  Structural abnormalities, such as displacement of the cartilage that separates your nostrils (deviated septum), which can decrease the air flow through your nose and sinuses and affect sinus drainage.  Functional abnormalities, such as when the small hairs (cilia) that line your sinuses and help remove mucus do not work properly or are not present. SIGNS AND SYMPTOMS  Symptoms of acute and chronic sinusitis are the same. The primary symptoms are pain and pressure around the affected sinuses. Other symptoms include:  Upper toothache.  Earache.  Headache.  Bad breath.  Decreased sense of smell and taste.  A cough, which worsens when you are lying flat.  Fatigue.  Fever.  Thick drainage from your nose, which often is green and may contain pus (purulent).  Swelling and warmth over the affected sinuses. DIAGNOSIS  Your health care provider will perform a physical exam. During the exam, your health care provider may:  Look in your nose for signs of abnormal growths in your nostrils (nasal polyps).  Tap over the affected sinus to check for signs of infection.  View the inside of your sinuses (endoscopy) using  an imaging device that has a light attached (endoscope). If your health care provider suspects that you have chronic sinusitis, one or more of the following tests may be recommended:  Allergy tests.  Nasal culture. A sample of mucus is taken from your nose, sent to a lab, and screened for bacteria.  Nasal cytology. A sample of mucus is taken from your nose and examined by your health care provider to determine if your sinusitis is related to an allergy. TREATMENT  Most cases of acute sinusitis are related to a viral infection and will resolve on their own within 10 days. Sometimes medicines are prescribed to help relieve symptoms (pain medicine, decongestants, nasal steroid sprays, or saline sprays).  However, for sinusitis related to a bacterial infection, your health care provider will prescribe antibiotic medicines. These are medicines that will help kill the bacteria causing the infection.  Rarely, sinusitis is caused by a fungal infection. In theses cases, your health care provider will prescribe antifungal medicine. For some cases of chronic sinusitis, surgery is needed. Generally, these are cases in which sinusitis recurs more than 3 times per year, despite other treatments. HOME CARE INSTRUCTIONS   Drink plenty of water. Water helps thin the mucus so your sinuses can drain more easily.  Use a humidifier.  Inhale steam 3 to 4 times a day (for example, sit in the bathroom with the shower running).  Apply a warm, moist washcloth to your face 3 to 4 times a day, or as directed by your health care provider.  Use saline nasal sprays to help moisten and clean your sinuses.  Take medicines only as directed by your health care provider.  If you were prescribed either an antibiotic or antifungal medicine, finish it all even if you start to feel better. SEEK IMMEDIATE MEDICAL CARE IF:  You have increasing pain or severe headaches.  You have nausea, vomiting, or drowsiness.  You have  swelling around your face.  You have vision problems.  You have a stiff neck.  You have difficulty breathing. MAKE SURE YOU:   Understand these instructions.  Will watch your condition.  Will get help right away if you are not doing well or get worse. Document Released: 10/01/2005 Document Revised: 02/15/2014 Document Reviewed: 10/16/2011 [facility] Patient Information 2015 [place], [place]. This information is not intended to replace advice given to you by your health care provider. Make sure you discuss any questions you have with your health care provider.

## 2014-08-08 MED ORDER — BECLOMETHASONE DIPROPIONATE 80 MCG/ACT NA AERS: 1.0000 | INHALATION_SPRAY | Freq: Every day | NASAL | Status: DC

## 2014-08-08 NOTE — Telephone Encounter (Signed)
Pt called stating she saw dr L yesterday and the nasonex rx is too expensive. Asks for alternative sent to [facility] [address]  Pt best: [phone]  Bf

## 2014-08-08 NOTE — Telephone Encounter (Signed)
I am calling in different nasal spray.  Come by to pick up the coupon.

## 2014-08-09 NOTE — Telephone Encounter (Signed)
Spoke to pt- advised her to go to the website to print off coupon since she is not able to come into the office to pick up coupon.

## 2014-08-09 NOTE — Telephone Encounter (Signed)
Called patient to notify and to also see if she would like to come by and p/u coupon. No answer and mail box full. Will try again later

## 2014-11-22 ENCOUNTER — Ambulatory Visit: Payer: Self-pay

## 2014-11-22 ENCOUNTER — Observation Stay (HOSPITAL_COMMUNITY)
Admission: EM | Admit: 2014-11-22 | Discharge: 2014-11-23 | Disposition: A | Discharge status: Home / Self Care | Payer: BLUE CROSS/BLUE SHIELD | Attending: Internal Medicine | Admitting: Internal Medicine

## 2014-11-22 ENCOUNTER — Ambulatory Visit (INDEPENDENT_AMBULATORY_CARE_PROVIDER_SITE_OTHER): Payer: BLUE CROSS/BLUE SHIELD | Admitting: Emergency Medicine

## 2014-11-22 ENCOUNTER — Emergency Department (HOSPITAL_COMMUNITY): Payer: BLUE CROSS/BLUE SHIELD

## 2014-11-22 ENCOUNTER — Encounter (HOSPITAL_COMMUNITY): Payer: Self-pay

## 2014-11-22 VITALS — BP 124/76 | HR 139 | Temp 98.3°F | Resp 17 | Ht 66.5 in | Wt 197.0 lb

## 2014-11-22 DIAGNOSIS — D649 Anemia, unspecified: Secondary | ICD-10-CM

## 2014-11-22 DIAGNOSIS — R079 Chest pain, unspecified: Principal | ICD-10-CM | POA: Diagnosis present

## 2014-11-22 DIAGNOSIS — R000 Tachycardia, unspecified: Secondary | ICD-10-CM | POA: Diagnosis not present

## 2014-11-22 DIAGNOSIS — E079 Disorder of thyroid, unspecified: Secondary | ICD-10-CM | POA: Insufficient documentation

## 2014-11-22 DIAGNOSIS — R55 Syncope and collapse: Secondary | ICD-10-CM

## 2014-11-22 DIAGNOSIS — E039 Hypothyroidism, unspecified: Secondary | ICD-10-CM | POA: Diagnosis present

## 2014-11-22 DIAGNOSIS — I10 Essential (primary) hypertension: Secondary | ICD-10-CM | POA: Diagnosis not present

## 2014-11-22 HISTORY — DX: Essential (primary) hypertension: I10

## 2014-11-22 HISTORY — DX: Disorder of thyroid, unspecified: E07.9

## 2014-11-22 LAB — CBC WITH DIFFERENTIAL/PLATELET
Basophils Absolute: 0 K/uL (ref 0.0–0.1)
Basophils Relative: 0 % (ref 0–1)
Eosinophils Absolute: 0 K/uL (ref 0.0–0.7)
Eosinophils Relative: 0 % (ref 0–5)
HCT: 28.5 % — ABNORMAL LOW (ref 36.0–46.0)
Hemoglobin: 8.6 g/dL — ABNORMAL LOW (ref 12.0–15.0)
Lymphocytes Relative: 23 % (ref 12–46)
Lymphs Abs: 2.2 K/uL (ref 0.7–4.0)
MCH: 19.3 pg — ABNORMAL LOW (ref 26.0–34.0)
MCHC: 30.2 g/dL (ref 30.0–36.0)
MCV: 63.9 fL — ABNORMAL LOW (ref 78.0–100.0)
Monocytes Absolute: 0.6 K/uL (ref 0.1–1.0)
Monocytes Relative: 6 % (ref 3–12)
Neutro Abs: 6.7 K/uL (ref 1.7–7.7)
Neutrophils Relative %: 71 % (ref 43–77)
Platelets: 501 K/uL — ABNORMAL HIGH (ref 150–400)
RBC: 4.46 MIL/uL (ref 3.87–5.11)
RDW: 16.7 % — ABNORMAL HIGH (ref 11.5–15.5)
WBC: 9.5 K/uL (ref 4.0–10.5)

## 2014-11-22 LAB — D-DIMER, QUANTITATIVE: D-Dimer, Quant: <0.27 ug{FEU}/mL (ref 0.00–0.48)

## 2014-11-22 LAB — COMPREHENSIVE METABOLIC PANEL WITH GFR
ALT: 12 U/L (ref 0–35)
AST: 18 U/L (ref 0–37)
Albumin: 3.9 g/dL (ref 3.5–5.2)
Alkaline Phosphatase: 80 U/L (ref 39–117)
Anion gap: 8 (ref 5–15)
BUN: 8 mg/dL (ref 6–23)
CO2: 28 mmol/L (ref 19–32)
Calcium: 9.1 mg/dL (ref 8.4–10.5)
Chloride: 101 mmol/L (ref 96–112)
Creatinine, Ser: 0.96 mg/dL (ref 0.50–1.10)
GFR calc Af Amer: 82 mL/min — ABNORMAL LOW (ref >90)
GFR calc non Af Amer: 71 mL/min — ABNORMAL LOW (ref >90)
Glucose, Bld: 105 mg/dL — ABNORMAL HIGH (ref 70–99)
Potassium: 3.5 mmol/L (ref 3.5–5.1)
Sodium: 137 mmol/L (ref 135–145)
Total Bilirubin: 0.6 mg/dL (ref 0.3–1.2)
Total Protein: 7.5 g/dL (ref 6.0–8.3)

## 2014-11-22 LAB — TROPONIN I: Troponin I: <0.03 ng/mL (ref <0.031)

## 2014-11-22 LAB — PREGNANCY, URINE: PREG TEST UR: NEGATIVE

## 2014-11-22 LAB — RETICULOCYTES
RBC.: 4.33 MIL/uL (ref 3.87–5.11)
RETIC COUNT ABSOLUTE: 56.3 10*3/uL (ref 19.0–186.0)
Retic Ct Pct: 1.3 % (ref 0.4–3.1)

## 2014-11-22 LAB — GLUCOSE, POCT (MANUAL RESULT ENTRY): POC Glucose: 131 mg/dL — AB (ref 70–99)

## 2014-11-22 MED ORDER — MORPHINE SULFATE 2 MG/ML IJ SOLN
2.0000 mg | INTRAMUSCULAR | Status: DC | PRN
  Administered 2014-11-22: 2 mg via INTRAVENOUS
  Filled 2014-11-22: qty 1

## 2014-11-22 MED ORDER — INFLUENZA VAC SPLIT QUAD 0.5 ML IM SUSY
0.5000 mL | PREFILLED_SYRINGE | INTRAMUSCULAR | Status: AC
  Administered 2014-11-23: 0.5 mL via INTRAMUSCULAR
  Filled 2014-11-22: qty 0.5

## 2014-11-22 MED ORDER — SODIUM CHLORIDE 0.9 % IV SOLN
INTRAVENOUS | Status: DC
  Administered 2014-11-22: 10:00:00 via INTRAVENOUS

## 2014-11-22 MED ORDER — ONDANSETRON HCL 4 MG/2ML IJ SOLN: 4.0000 mg | Freq: Four times a day (QID) | INTRAMUSCULAR | Status: DC | PRN

## 2014-11-22 MED ORDER — LORAZEPAM 2 MG/ML IJ SOLN
0.5000 mg | Freq: Once | INTRAMUSCULAR | Status: AC
  Administered 2014-11-22: 0.5 mg via INTRAVENOUS
  Filled 2014-11-22: qty 1

## 2014-11-22 MED ORDER — ACETAMINOPHEN 325 MG PO TABS: 650.0000 mg | ORAL_TABLET | ORAL | Status: DC | PRN

## 2014-11-22 MED ORDER — GI COCKTAIL ~~LOC~~: 30.0000 mL | Freq: Four times a day (QID) | ORAL | Status: DC | PRN

## 2014-11-22 MED ORDER — ASPIRIN 81 MG PO CHEW
324.0000 mg | CHEWABLE_TABLET | Freq: Once | ORAL | Status: AC
  Administered 2014-11-22: 324 mg via ORAL

## 2014-11-22 MED ORDER — ASPIRIN EC 325 MG PO TBEC
325.0000 mg | DELAYED_RELEASE_TABLET | Freq: Every day | ORAL | Status: DC
  Administered 2014-11-23: 325 mg via ORAL
  Filled 2014-11-22: qty 1

## 2014-11-22 MED ORDER — ENOXAPARIN SODIUM 40 MG/0.4ML ~~LOC~~ SOLN
40.0000 mg | SUBCUTANEOUS | Status: DC
Start: 2014-11-22 — End: 2014-11-23
  Administered 2014-11-22: 40 mg via SUBCUTANEOUS
  Filled 2014-11-22: qty 0.4

## 2014-11-22 MED ORDER — SODIUM CHLORIDE 0.9 % IV SOLN
INTRAVENOUS | Status: DC
  Administered 2014-11-22: 17:00:00 via INTRAVENOUS

## 2014-11-22 NOTE — ED Notes (Signed)
[facility]- Pt here from urgent care. Transported here after reporting there for chest pain. Pt reports intermittent chest pain with dizziness and nausea. She denies CP on arrival. Pt had 324 of ASA at urgent care. IV in place. ST at urgent care at 116.

## 2014-11-22 NOTE — Progress Notes (Deleted)
   Subjective:    Patient ID: Janet Marquez, female    DOB: 02/23/1971, 44 y.o.   MRN: [id number]  Chest Pain       Review of Systems     Objective:   Physical Exam        Assessment & Plan:

## 2014-11-22 NOTE — H&P (Signed)
PATIENT DETAILS Name: Janet Marquez Age: 44 y.o. Sex: female Date of Birth: [birth year] Admit Date: 11/22/2014 [id number] Ronnald Ramp, MD  CHIEF COMPLAINT:  Chest pain  HPI: Janet Marquez is a 44 y.o. female with a Past Medical History of hypertension noncompliant with atenolol, hypothyroidism, type 2 diabetes currently on diet and exercise regimen who presents today with the above noted complaint. Per patient, when she woke up this morning she started having retrosternal discomfort associated with some nausea, and lightheadedness. She describes chest pain has mostly discomfort, 3/10 at its worse without any radiation. She went and saw her primary care practitioner at the local urgent care and was referred to the emergency room for further evaluation. Initial EKG/enzymes were negative, I was asked to admit this patient for further evaluation and treatment. During my evaluation, patient felt comfortable, with no ongoing chest pain.   ALLERGIES:   Allergies  Allergen Reactions  . Clarithromycin     REACTION: Vomiting Upset GI    PAST MEDICAL HISTORY: Past Medical History  Diagnosis Date  . Hypertension   . Thyroid disease     PAST SURGICAL HISTORY: No past surgical history on file.  MEDICATIONS AT HOME: Prior to Admission medications   Medication Sig Start Date End Date Taking? Authorizing Provider  acetaminophen (TYLENOL) 325 MG tablet Take 650 mg by mouth every 6 (six) hours as needed.   Yes Historical Provider, MD  carboxymethylcellulose 1 % ophthalmic solution 1 drop 3 (three) times daily.   Yes Historical Provider, MD  diphenhydrAMINE (BENADRYL) 25 MG tablet Take 50 mg by mouth every 6 (six) hours as needed.   Yes Historical Provider, MD    FAMILY HISTORY: Maternal grandfather-had CAD  SOCIAL HISTORY:  reports that she has never smoked. She has never used smokeless tobacco. She reports that she does not drink alcohol. Her drug history is not on file.  REVIEW OF  SYSTEMS:  Constitutional:   No  weight loss, night sweats,  Fevers, chills, fatigue.  HEENT:    No headaches, Difficulty swallowing,Tooth/dental problems,Sore throat  Cardio-vascular: No chest pain,  Orthopnea, PND, swelling in lower extremities, anasarca  GI:  No heartburn, indigestion, abdominal pain, nausea, vomiting, diarrhea  Resp: No shortness of breath with exertion or at rest.  No excess mucus, no productive cough, No non-productive cough,  No coughing up of blood.  Skin:  no rash or lesions.  GU:  no dysuria, change in color of urine, no urgency or frequency.  No flank pain.  Musculoskeletal: No joint pain or swelling.  No decreased range of motion.  No back pain.  Psych: No change in mood or affect. No depression or anxiety.  No memory loss.   PHYSICAL EXAM: Blood pressure 130/76, pulse 110, temperature 98.6 F (37 C), temperature source Oral, resp. rate 11, last menstrual period 11/21/2014, SpO2 100 %.  General appearance :Awake, alert, not in any distress. Speech Clear. Not toxic Looking HEENT: Atraumatic and Normocephalic, pupils equally reactive to light and accomodation Neck: supple, no JVD. No cervical lymphadenopathy.  Chest:Good air entry bilaterally, no added sounds  CVS: S1 S2 regular, no murmurs.  Abdomen: Bowel sounds present, Non tender and not distended with no gaurding, rigidity or rebound. Extremities: B/L Lower Ext shows no edema, both legs are warm to touch Neurology: Awake alert, and oriented X 3, CN II-XII intact, Non focal Skin:No Rash Wounds:N/A  LABS ON ADMISSION:   Recent Labs  11/22/14 1143  NA 137  K  3.5  CL 101  CO2 28  GLUCOSE 105*  BUN 8  CREATININE 0.96  CALCIUM 9.1    Recent Labs  11/22/14 1143  AST 18  ALT 12  ALKPHOS 80  BILITOT 0.6  PROT 7.5  ALBUMIN 3.9   No results for input(s): LIPASE, AMYLASE in the last 72 hours.  Recent Labs  11/22/14 1143  WBC 9.5  NEUTROABS 6.7  HGB 8.6*  HCT 28.5*  MCV  63.9*  PLT 501*    Recent Labs  11/22/14 1143  TROPONINI <0.03    Recent Labs  11/22/14 1143  DDIMER <0.27   Invalid input(s): POCBNP   RADIOLOGIC STUDIES ON ADMISSION: Dg Chest 2 View  11/22/2014   CLINICAL DATA:  Intermittent chest pain.  EXAM: CHEST  2 VIEW  COMPARISON:  None.  FINDINGS: Normal heart size and mediastinal contours. No acute infiltrate or edema. No effusion or pneumothorax. No acute osseous findings.  IMPRESSION: Negative chest.   Electronically Signed   By: Jorje Guild M.D.   On: 11/22/2014 10:54     EKG: Independently reviewed. normal sinus rhythm  ASSESSMENT AND PLAN: Present on Admission:  . Chest pain: With mostly atypical features. We'll admit to telemetry, cycle enzymes and start an aspirin. We'll keep nothing by mouth post midnight, will need cardiology evaluation in a.m. to see if patient needs a stress test while inpatient. We'll monitor closely.   Marland Kitchen Hypothyroidism: Awaiting medical reconciliation by pharmacy-start levothyroxine when dosing confirmed.   . Essential hypertension: Noncompliant to atenolol for the past 2-3 weeks-she claims she ran out. Currently blood pressure controlled without the use of any antihypertensives, we'll continue to monitor for now off medications, may need to resume atenolol on discharge.   . Anemia: Gives a significant history of menorrhagia-has a known history of fibroids.Previously she was on iron which she stopped taken. Check anemia panel, suspect she will need GYN referral for further evaluation.   Further plan will depend as patient's clinical course evolves and further radiologic and laboratory data become available. Patient will be monitored closely.  Above noted plan was discussed with patient, she was in in agreement.   DVT Prophylaxis: Prophylactic Lovenox   Code Status: Full Code  Disposition Plan: Home in 1-2 days   Total time spent for admission equals 45 minutes.  [facility] Pager [phone]  If 7PM-7AM, please contact night-coverage [email or web address] Password [facility] 11/22/2014, 4:47 PM

## 2014-11-22 NOTE — ED Provider Notes (Signed)
CSN: [id number]     Arrival date & time 11/22/14  0919 History   First MD Initiated Contact with Patient 11/22/14 [id number]     Chief Complaint  Patient presents with  . Chest Pain     (Consider location/radiation/quality/duration/timing/severity/associated sxs/prior Treatment) HPI Comments: Patient here complaining of chest pain which began this morning which is been intermittent in nature. Since last for minutes, located in the middle portion of her chest and characterized as a dull sensation. Went to urgent care and was sent here for further evaluation of aspirin. Denies any prior history of angina but has had similar symptoms in the past. No recent travel history or leg pain or swelling. No cough or congestion. Nothing makes her symptoms better or worse. Does have a family history of MI.  Patient is a 44 y.o. female presenting with chest pain. The history is provided by the patient.  Chest Pain   Past Medical History  Diagnosis Date  . Hypertension   . Thyroid disease    No past surgical history on file. No family history on file. History  Substance Use Topics  . Smoking status: Never Smoker   . Smokeless tobacco: Never Used  . Alcohol Use: No     Comment: socially   OB History    No data available     Review of Systems  Cardiovascular: Positive for chest pain.  All other systems reviewed and are negative.     Allergies  Clarithromycin  Home Medications   Prior to Admission medications   Not on File   BP 128/93 mmHg  Pulse 106  Temp(Src) 98.6 F (37 C) (Oral)  Resp 17  SpO2 99%  LMP 11/21/2014 Physical Exam  Constitutional: She is oriented to person, place, and time. She appears well-developed and well-nourished.  Non-toxic appearance. No distress.  HENT:  Head: Normocephalic and atraumatic.  Eyes: Conjunctivae, EOM and lids are normal. Pupils are equal, round, and reactive to light.  Neck: Normal range of motion. Neck supple. No tracheal deviation present.  No thyroid mass present.  Cardiovascular: Regular rhythm and normal heart sounds.  Tachycardia present.  Exam reveals no gallop.   No murmur heard. Pulmonary/Chest: Effort normal and breath sounds normal. No stridor. No respiratory distress. She has no decreased breath sounds. She has no wheezes. She has no rhonchi. She has no rales.  Abdominal: Soft. Normal appearance and bowel sounds are normal. She exhibits no distension. There is no tenderness. There is no rebound and no CVA tenderness.  Musculoskeletal: Normal range of motion. She exhibits no edema or tenderness.  Neurological: She is alert and oriented to person, place, and time. She has normal strength. No cranial nerve deficit or sensory deficit. GCS eye subscore is 4. GCS verbal subscore is 5. GCS motor subscore is 6.  Skin: Skin is warm and dry. No abrasion and no rash noted.  Psychiatric: She has a normal mood and affect. Her speech is normal and behavior is normal.  Nursing note and vitals reviewed.   ED Course  Procedures (including critical care time) Labs Review Labs Reviewed  CBC WITH DIFFERENTIAL/PLATELET  TROPONIN I  COMPREHENSIVE METABOLIC PANEL  D-DIMER, QUANTITATIVE    Imaging Review No results found.   EKG Interpretation   Date/Time:  Monday November 22 2014 09:24:03 EST Ventricular Rate:  100 PR Interval:  148 QRS Duration: 77 QT Interval:  355 QTC Calculation: 458 R Axis:   41 Text Interpretation:  Sinus tachycardia No significant change since last  tracing Confirmed by Zenia Resides  MD, Amorette Charrette ([id number]) on 11/22/2014 9:39:24 AM      MDM   Final diagnoses:  Chest pain      Pt to be admitted for evaluation of chest pain  Leota Jacobsen, MD 11/26/14 [phone]

## 2014-11-22 NOTE — Progress Notes (Signed)
   Subjective:    Patient ID: Janet Marquez, female    DOB: 14-Oct-1971, 44 y.o.   MRN: [id number]  HPI patient states she was in her usual state of health until this morning one getting her son ready for school she felt lightheaded as though she might faint. She also has had a 3/10 level of chest discomfort. Her chest discomfort as pressure in nature. It may radiate some to the back but none to the neck or arm. She has had chronic shortness of breath. She did not have any vomiting but felt very nauseated with the episode.    Review of Systems     Objective:   Physical Exam  Constitutional: She appears well-developed and well-nourished.  HENT:  Head: Normocephalic.  Eyes: Pupils are equal, round, and reactive to light.  Cardiovascular:  Patient has a fast regular rhythm. No audible murmur .  Pulmonary/Chest: Effort normal and breath sounds normal.  Abdominal: Soft.   EKG computer reading is atrial flutter however I feel this is a sinus tachycardia with minimal ST-T changes.  Results for orders placed or performed in visit on 11/22/14  POCT glucose (manual entry)  Result Value Ref Range   POC Glucose 131 (A) 70 - 99 mg/dl       Assessment & Plan:    Patient presents with chest discomfort, palpations, nausea associated with near syncope this morning. 4 baby ASA given. Pt send to the hospital for further evaluation. EKG shows sinus tachycardia without definite acute changes. Of note she does have a history of anemia. She also has a history of hypothyroidism not currently taking her medications. She did not have a CBC prior to transport. She is currently on her menstrual cycle.

## 2014-11-23 DIAGNOSIS — R072 Precordial pain: Secondary | ICD-10-CM

## 2014-11-23 DIAGNOSIS — I471 Supraventricular tachycardia: Secondary | ICD-10-CM

## 2014-11-23 DIAGNOSIS — D509 Iron deficiency anemia, unspecified: Secondary | ICD-10-CM

## 2014-11-23 DIAGNOSIS — R000 Tachycardia, unspecified: Secondary | ICD-10-CM | POA: Diagnosis present

## 2014-11-23 DIAGNOSIS — I5032 Chronic diastolic (congestive) heart failure: Secondary | ICD-10-CM

## 2014-11-23 LAB — IRON AND TIBC
IRON: 26 ug/dL — AB (ref 42–145)
SATURATION RATIOS: 7 % — AB (ref 20–55)
TIBC: 382 ug/dL (ref 250–470)
UIBC: 356 ug/dL (ref 125–400)

## 2014-11-23 LAB — VITAMIN B12: Vitamin B-12: 599 pg/mL (ref 211–911)

## 2014-11-23 LAB — FERRITIN: Ferritin: 11 ng/mL (ref 10–291)

## 2014-11-23 LAB — TROPONIN I: Troponin I: <0.03 ng/mL (ref <0.031)

## 2014-11-23 LAB — FOLATE

## 2014-11-23 LAB — TSH: TSH: 2.439 u[IU]/mL (ref 0.350–4.500)

## 2014-11-23 MED ORDER — FERROUS FUMARATE 324 MG PO TABS: 1.0000 | ORAL_TABLET | Freq: Three times a day (TID) | ORAL | Status: DC

## 2014-11-23 MED ORDER — POLYETHYLENE GLYCOL 3350 17 GM/SCOOP PO POWD
1.0000 | Freq: Every day | ORAL | Status: DC
Start: 2014-11-23 — End: 2015-01-21

## 2014-11-23 MED ORDER — ATENOLOL 25 MG PO TABS: 12.5000 mg | ORAL_TABLET | Freq: Every day | ORAL | Status: DC

## 2014-11-23 NOTE — Discharge Summary (Signed)
Physician Discharge Summary  Janet Marquez NOM:[id number] DOB: July 20, 1971 DOA: 11/22/2014  PCP: Scarlette Calico, MD  Admit date: 11/22/2014 Discharge date: 11/23/2014  Time spent: >30 minutes  Recommendations for Outpatient Follow-up:  Reassess CBC to follow Hgb trend Check BMET to follow electrolytes and renal function Reassess BP and adjust antihypertensive agents as needed Repeat thyroid profile test in 3-4 weeks to determine needs for treatment.  Discharge Diagnoses:  Principal Problem:   Chest pain Active Problems:   Hypothyroidism   Anemia   Essential hypertension   Sinus tachycardia   Discharge Condition:stable and improved. Discharge home. Will follow with cardiology for outpatient stress test and with PCP in 10 days.  Diet recommendation: heart healthy diet  Filed Weights   11/22/14 2003 11/23/14 0400  Weight: 89.858 kg (198 lb 1.6 oz) 89.9 kg (198 lb 3.1 oz)    History of present illness:  44 y.o. female with a Past Medical History of hypertension noncompliant with atenolol, hypothyroidism, type 2 diabetes currently on diet and exercise regimen who presents today with the above noted complaint. Per patient, when she woke up this morning she started having retrosternal discomfort associated with some nausea, and lightheadedness. She describes chest pain has mostly discomfort, 3/10 at its worse without any radiation. She went and saw her primary care practitioner at the local urgent care and was referred to the emergency room for further evaluation. Initial EKG/enzymes were negative  Hospital Course:  1-Atypical CP: neg troponin, no EKG or telemetry abnormalities -2-D echo w/o wall motion abnormalities and preserved EF -per cardiology rec's, will discharge on her prior atenolol dose and she will follow with cardiology service for outpatient stress test. -advise to follow heart healthy diet  2-chronic diastolic heart failure -discharge on low dose atenolol -advise to  follow low sodium diet -EF preserved (60-65%)  3-iron deficiency anemia: due to heavy menses -will discharge on iron supplementation -follow CBC as an outpatient  4-hx of hypothyroidism -TSH WNL -will need follow up of thyroid profile 9T3, T4 and TSH in 3-4 weeks to determine needs of treatment)  5-essential HTN: will discharge on atenolol and advise to follow low sodium diet   Procedures:  2-D echo - Left ventricle: The cavity size was normal. Wall thickness was at the upper limits of normal. Systolic function was normal. The estimated ejection fraction was in the range of 60% to 65%. Wall motion was normal; there were no regional wall motion abnormalities. Doppler parameters are consistent with abnormal left ventricular relaxation (grade 1 diastolic dysfunction). - Mitral valve: Mildly thickened leaflets . There was trivial regurgitation. - Left atrium: The atrium was at the upper limits of normal in size. - Right atrium: Central venous pressure (est): 3 mm Hg. - Tricuspid valve: There was trivial regurgitation. - Pulmonary arteries: Systolic pressure could not be accurately estimated. - Pericardium, extracardiac: There was no pericardial effusion.  Impressions:  - Upper normal LV wall thickness with LVEF 96-28%, grade 1 diastolic dysfunction. Upper normal left atrial size. Trivial mitral and tricuspid regurgitation. Unable to assess PASP.  Consultations:  Cardiology   Discharge Exam: Filed Vitals:   11/23/14 1133  BP: 119/88  Pulse: 101  Temp: 98.4 F (36.9 C)  Resp:     General: denies CP, no SOB, palpitations or diaphoresis  Cardiovascular: no rubs or gallops; sinus tachycardia, no murmurs  Respiratory: CTA bilaterally Abd: soft, NT, ND, positive BS Extremities: no edema, no cyanosis or clubbing   Discharge Instructions   Discharge Instructions  Diet - low sodium heart healthy    Complete by:  As directed      Discharge  instructions    Complete by:  As directed   Increase fiber in your diet Take medications as prescribed Arrange visit with PCP in 10 days for hospital follow up Please follow with cardiology as an outpatient for stress test as instructed Keep yourself well hydrated Please arrange follow up with gynecologist for further evaluation/treatment of fibroids          Current Discharge Medication List    START taking these medications   Details  atenolol (TENORMIN) 25 MG tablet Take 0.5 tablets (12.5 mg total) by mouth daily. Qty: 30 tablet, Refills: 1    Ferrous Fumarate 324 MG TABS Take 1 tablet (106 mg of iron total) by mouth 3 (three) times daily. Qty: 90 each, Refills: 2    polyethylene glycol powder (MIRALAX) powder Take 255 g by mouth daily. Hold for diarrhea. Initiation of this medication is to prevent constipation from iron Qty: 850 g, Refills: 1      CONTINUE these medications which have NOT CHANGED   Details  acetaminophen (TYLENOL) 325 MG tablet Take 650 mg by mouth every 6 (six) hours as needed.    carboxymethylcellulose 1 % ophthalmic solution 1 drop 3 (three) times daily.    diphenhydrAMINE (BENADRYL) 25 MG tablet Take 50 mg by mouth every 6 (six) hours as needed.       Allergies  Allergen Reactions  . Clarithromycin     REACTION: Vomiting Upset GI   Follow-up Information    Follow up with Dola Argyle, MD.   Specialty:  Cardiology   Contact information:   [address] [place] [address] [phone]       Follow up with [facility]. Schedule an appointment as soon as possible for a visit in 10 days.   Contact information:   [place] [phone]       The results of significant diagnostics from this hospitalization (including imaging, microbiology, ancillary and laboratory) are listed below for reference.    Significant Diagnostic Studies: Dg Chest 2  View  11/22/2014   CLINICAL DATA:  Intermittent chest pain.  EXAM: CHEST  2 VIEW  COMPARISON:  None.  FINDINGS: Normal heart size and mediastinal contours. No acute infiltrate or edema. No effusion or pneumothorax. No acute osseous findings.  IMPRESSION: Negative chest.   Electronically Signed   By: Jorje Guild M.D.   On: 11/22/2014 10:54   Labs: Basic Metabolic Panel:  Recent Labs Lab 11/22/14 1143  NA 137  K 3.5  CL 101  CO2 28  GLUCOSE 105*  BUN 8  CREATININE 0.96  CALCIUM 9.1   Liver Function Tests:  Recent Labs Lab 11/22/14 1143  AST 18  ALT 12  ALKPHOS 80  BILITOT 0.6  PROT 7.5  ALBUMIN 3.9   CBC:  Recent Labs Lab 11/22/14 1143  WBC 9.5  NEUTROABS 6.7  HGB 8.6*  HCT 28.5*  MCV 63.9*  PLT 501*   Cardiac Enzymes:  Recent Labs Lab 11/22/14 1143 11/22/14 1853 11/22/14 2204 11/23/14 0111  TROPONINI <0.03 <0.03 <0.03 <0.03    Signed:  Barton Dubois  [facility] 11/23/2014, 1:17 PM

## 2014-11-23 NOTE — Discharge Instructions (Signed)

## 2014-11-23 NOTE — Progress Notes (Signed)
UR completed 

## 2014-11-23 NOTE — Progress Notes (Signed)
  Echocardiogram 2D Echocardiogram has been performed.  Janet Marquez 11/23/2014, 10:50 AM

## 2014-11-23 NOTE — Consult Note (Addendum)
CARDIOLOGY CONSULT NOTE   Patient ID: Janet Marquez MRN: [id number] DOB/AGE: [birth year] 44 y.o.  Admit Date: 11/22/2014  Primary Physician: Janet Calico, MD  Primary Cardiologist    Janet Marquez   Clinical Summary Janet Marquez is a 44 y.o.female. There is no prior documented cardiac history. She felt poorly yesterday with nausea and some chest tightness. She presented to the hospital and was admitted for further evaluation. Her periods have been heavy. Her hemoglobin is 8.6. She has resting sinus tachycardia at this time. There is a question of a thyroid abnormality by history. She is not on thyroid medication. So far I do not see a TSH. Patient does have risk factors for coronary disease including hypertension and diabetes.  EKGs have been normal. Troponins have been normal.   Allergies  Allergen Reactions  . Clarithromycin     REACTION: Vomiting Upset GI    Medications Scheduled Medications: . aspirin EC  325 mg Oral Daily  . enoxaparin (LOVENOX) injection  40 mg Subcutaneous Q24H  . Influenza vac split quadrivalent PF  0.5 mL Intramuscular Tomorrow-1000     Infusions: . sodium chloride 10 mL/hr at 11/22/14 1717     PRN Medications:  acetaminophen, gi cocktail, morphine injection, ondansetron (ZOFRAN) IV   Past Medical History  Diagnosis Date  . Hypertension   . Thyroid disease     History reviewed. No pertinent past surgical history.  Family history: 1 of the patient's grandparents had coronary disease.  Social History Janet Marquez reports that she has never smoked. She has never used smokeless tobacco. Janet Marquez reports that she does not drink alcohol.  Review of Systems Refer to the history of present illness. Patient denies fever, chills, headache, sweats, rash, change in vision, change in hearing, cough, urinary symptoms. All other systems are reviewed and are negative other than the history of present illness.  Physical Examination Blood  pressure 112/78, pulse 97, temperature 98.2 F (36.8 C), temperature source Oral, resp. rate 18, height 5\' 7"  (1.702 m), weight 198 lb 3.1 oz (89.9 kg), last menstrual period 11/21/2014, SpO2 100 %.  Patient is oriented to person time and place. Affect is normal. Head is atraumatic. Sclera and conjunctiva are normal. There is no jugular venous distention. Lungs are clear. Respiratory effort is not labored. Cardiac exam reveals S1 and S2. There is resting sinus tachycardia. Abdomen is soft. There is no peripheral edema. There are no musculoskeletal deformities. There are no skin rashes. Neurologic is grossly intact.    Intake/Output Summary (Last 24 hours) at 11/23/14 0828 Last data filed at 11/23/14 0507  Gross per 24 hour  Intake      0 ml  Output    450 ml  Net   -450 ml    Prior Cardiac Testing/Procedures  Lab Results  Basic Metabolic Panel:  Recent Labs Lab 11/22/14 1143  NA 137  K 3.5  CL 101  CO2 28  GLUCOSE 105*  BUN 8  CREATININE 0.96  CALCIUM 9.1    Liver Function Tests:  Recent Labs Lab 11/22/14 1143  AST 18  ALT 12  ALKPHOS 80  BILITOT 0.6  PROT 7.5  ALBUMIN 3.9    CBC:  Recent Labs Lab 11/22/14 1143  WBC 9.5  NEUTROABS 6.7  HGB 8.6*  HCT 28.5*  MCV 63.9*  PLT 501*    Cardiac Enzymes:  Recent Labs Lab 11/22/14 1143 11/22/14 1853 11/22/14 2204 11/23/14 0111  TROPONINI <0.03 <0.03 <0.03 <0.03  BNP: Invalid input(s): POCBNP   Radiology: Dg Chest 2 View  11/22/2014   CLINICAL DATA:  Intermittent chest pain.  EXAM: CHEST  2 VIEW  COMPARISON:  None.  FINDINGS: Normal heart size and mediastinal contours. No acute infiltrate or edema. No effusion or pneumothorax. No acute osseous findings.  IMPRESSION: Negative chest.   Electronically Signed   By: Jorje Guild M.D.   On: 11/22/2014 10:54     ECG: I personally reviewed the EKGs. EKGs are normal.  Telemetry: I personally reviewed telemetry today, November 23, 2014. There is sinus  rhythm. There is resting sinus tachycardia with a rate of 110. Rate has been noted as high as 140.   Impression and Recommendations    Chest pain     At this point there is no definite evidence that her chest pain is from cardiac ischemia. Troponins are normal. EKGs are normal. The patient has significant anemia.. Two-dimensional echo has been ordered. I called the echo lab to request that the study be done before 11 AM today. If her left ventricular function is normal, I would suggest no further inpatient ischemic workup. Follow-up outpatient exercise test could be considered. First her anemia needs to be addressed along with sinus tachycardia.    Hypothyroidism     There is mention in the record of hypothyroidism. I will order TSH. Currently the patient is not on thyroid replacement. With sinus tachycardia, further assessment of her thyroid status will be helpful.    Anemia     The patient has significant anemia and a hemoglobin of 8.6. Her period has been prolonged. I am not sure how acute her blood loss is. With her resting sinus tachycardia, her anemia may have to be approached and treated in the hospital. With her significant anemia, consideration could be given to using other forms of DVT prophylaxis instead of Lovenox.      Sinus tachycardia    Etiology is not completely clear. It may be related to her anemia. Her thyroid needs to be assessed. Two-dimensional echo will be helpful to be sure she has good left ventricular function.  Daryel November, MD 11/23/2014, 8:28 AM

## 2014-12-07 ENCOUNTER — Other Ambulatory Visit: Payer: Self-pay | Admitting: Obstetrics and Gynecology

## 2014-12-08 ENCOUNTER — Other Ambulatory Visit (HOSPITAL_COMMUNITY): Payer: Self-pay | Admitting: *Deleted

## 2014-12-09 LAB — CYTOLOGY - PAP

## 2014-12-10 ENCOUNTER — Encounter (HOSPITAL_COMMUNITY)
Admission: RE | Admit: 2014-12-10 | Discharge: 2014-12-10 | Disposition: A | Discharge status: Home / Self Care | Payer: BLUE CROSS/BLUE SHIELD | Source: Ambulatory Visit | Attending: Obstetrics and Gynecology | Admitting: Obstetrics and Gynecology

## 2014-12-10 DIAGNOSIS — D649 Anemia, unspecified: Secondary | ICD-10-CM | POA: Insufficient documentation

## 2014-12-10 MED ORDER — SODIUM CHLORIDE 0.9 % IV SOLN
510.0000 mg | INTRAVENOUS | Status: DC
  Administered 2014-12-10: 510 mg via INTRAVENOUS
  Filled 2014-12-10: qty 17

## 2014-12-10 MED ORDER — SODIUM CHLORIDE 0.9 % IV SOLN: INTRAVENOUS | Status: DC

## 2014-12-10 NOTE — Discharge Instructions (Signed)

## 2014-12-10 NOTE — Progress Notes (Signed)
PT given information on feraheme.

## 2014-12-13 ENCOUNTER — Encounter (HOSPITAL_COMMUNITY)
Admission: RE | Admit: 2014-12-13 | Discharge: 2014-12-13 | Disposition: A | Discharge status: Home / Self Care | Payer: BLUE CROSS/BLUE SHIELD | Source: Ambulatory Visit | Attending: Obstetrics and Gynecology | Admitting: Obstetrics and Gynecology

## 2014-12-13 DIAGNOSIS — D649 Anemia, unspecified: Secondary | ICD-10-CM | POA: Diagnosis not present

## 2014-12-13 MED ORDER — SODIUM CHLORIDE 0.9 % IV SOLN: INTRAVENOUS | Status: DC

## 2014-12-13 MED ORDER — SODIUM CHLORIDE 0.9 % IV SOLN
510.0000 mg | INTRAVENOUS | Status: DC
  Administered 2014-12-13: 510 mg via INTRAVENOUS
  Filled 2014-12-13: qty 17

## 2014-12-17 ENCOUNTER — Encounter: Payer: BLUE CROSS/BLUE SHIELD | Admitting: Physician Assistant

## 2014-12-17 ENCOUNTER — Encounter: Payer: BLUE CROSS/BLUE SHIELD | Admitting: Cardiovascular Disease

## 2014-12-20 ENCOUNTER — Other Ambulatory Visit: Payer: Self-pay | Admitting: Obstetrics and Gynecology

## 2014-12-27 ENCOUNTER — Ambulatory Visit (INDEPENDENT_AMBULATORY_CARE_PROVIDER_SITE_OTHER): Payer: BLUE CROSS/BLUE SHIELD | Admitting: Physician Assistant

## 2014-12-27 ENCOUNTER — Encounter: Payer: Self-pay | Admitting: Physician Assistant

## 2014-12-27 VITALS — BP 138/85 | HR 93 | Ht 67.0 in | Wt 199.0 lb

## 2014-12-27 DIAGNOSIS — E039 Hypothyroidism, unspecified: Secondary | ICD-10-CM

## 2014-12-27 DIAGNOSIS — E119 Type 2 diabetes mellitus without complications: Secondary | ICD-10-CM

## 2014-12-27 DIAGNOSIS — I10 Essential (primary) hypertension: Secondary | ICD-10-CM

## 2014-12-27 DIAGNOSIS — Z01810 Encounter for preprocedural cardiovascular examination: Secondary | ICD-10-CM

## 2014-12-27 DIAGNOSIS — R079 Chest pain, unspecified: Secondary | ICD-10-CM

## 2014-12-27 NOTE — Patient Instructions (Addendum)
Your physician recommends that you continue on your current medications as directed. Please refer to the Current Medication list given to you today.  Your physician has requested that you have an echocardiogram. Echocardiography is a painless test that uses sound waves to create images of your heart. It provides your doctor with information about the size and shape of your heart and how well your heart's chambers and valves are working. This procedure takes approximately one hour. There are no restrictions for this procedure.  Follow up with Dr. Ron Parker as needed.

## 2014-12-27 NOTE — Progress Notes (Signed)
Cardiology Office Note   Date:  12/27/2014   ID:  Janet Marquez, DOB April 18, 1971, MRN [id number]  PCP:  Scarlette Calico, MD  Cardiologist:  Dr. Cleatis Polka     Chief Complaint  Patient presents with  . Chest Pain    Follow up from Hospital admission     History of Present Illness: Janet Marquez is a 44 y.o. female with a hx of HTN, DM2, hypothyroidism.  Admitted 2/8-2/9 with chest pain.  CEs were negative.  She was seen by Dr. Cleatis Polka and Echo demonstrated normal LVF and no RWMA.  OP stress test could be considered as an outpatient.  However, she was noted to be anemic and tachycardic.  Further workup of her anemia was recommended first.  IM felt her anemia was due to menorrhagia.  She was DC'd on Iron supplementation.    She returns for FU.   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 She has followed up with gynecology. She needs a total abdominal hysterectomy. She has received 2 iron infusions. She feels better. Her heart rate in shortness of breath both improved. She has occasional chest discomfort. She cannot tell if this is indigestion or something else. She denies dyspnea. She is NYHA 2. She denies orthopnea, PND or edema. She denies syncope.   Studies/Reports Reviewed Today:  Echo 11/23/14 - EF 60-65%. Wall motion was normal.  Grade 1 diastolic dysfunction  - Mitral valve: Mildly thickened leaflets . There was trivial regurgitation. - Left atrium: The atrium was at the upper limits of normal in size. - Right atrium: Central venous pressure (est): 3 mm Hg. - Tricuspid valve: There was trivial regurgitation. - Pericardium, extracardiac: There was no pericardial effusion.    Past Medical History  Diagnosis Date  . Hypertension   . Thyroid disease     No past surgical history on file.   Current Outpatient Prescriptions  Medication Sig Dispense Refill  . acetaminophen (TYLENOL) 325 MG tablet Take 650 mg by mouth every 6 (six) hours as needed.      Marland Kitchen atenolol (TENORMIN) 25 MG tablet Take 0.5 tablets (12.5 mg total) by mouth daily. 30 tablet 1  . carboxymethylcellulose 1 % ophthalmic solution 1 drop 3 (three) times daily.    . diphenhydrAMINE (BENADRYL) 25 MG tablet Take 50 mg by mouth every 6 (six) hours as needed.    . Ferrous Fumarate 324 MG TABS Take 1 tablet (106 mg of iron total) by mouth 3 (three) times daily. 90 each 2  . polyethylene glycol powder (MIRALAX) powder Take 255 g by mouth daily. Hold for diarrhea. Initiation of this medication is to prevent constipation from iron 850 g 1   No current facility-administered medications for this visit.    Allergies:   Clarithromycin    Social History:  The patient  reports that she has never smoked. She has never used smokeless tobacco. She reports that she does not drink alcohol.   Family History:  The patient's family history includes Heart attack in her maternal grandfather; Hypertension in her maternal grandmother; Stroke in her maternal grandmother.    ROS:   Please see the history of present illness.   Review of Systems  Constitution: Positive for diaphoresis and malaise/fatigue.  Eyes: Positive for visual disturbance.  Musculoskeletal: Positive for back pain and joint pain.  All other systems reviewed and are negative.    PHYSICAL EXAM: VS:  BP 138/85 mmHg  Pulse 93  Ht 5\' 7"  (1.702 m)  Wt 199 lb (90.266 kg)  BMI 31.16 kg/m2    Wt Readings from Last 3 Encounters:  12/13/14 200 lb (90.719 kg)  11/23/14 198 lb 3.1 oz (89.9 kg)  11/22/14 197 lb (89.359 kg)     GEN: Well nourished, well developed, in no acute distress HEENT: normal Neck: no JVD, no carotid bruits, no masses Cardiac:  Normal S1/S2, RRR; no murmur ,  no rubs or gallops, no edema  Respiratory:  clear to auscultation bilaterally, no wheezing, rhonchi or rales. GI: soft, nontender, nondistended, + BS MS: no deformity or atrophy Skin: warm and dry  Neuro:  CNs II-XII intact, Strength and sensation  are intact Psych: Normal affect   EKG:  EKG is ordered today.  It demonstrates:   NSR, HR 93, normal axis, no ST changes   Recent Labs: 11/22/2014: ALT 12; BUN 8; Creatinine 0.96; Hemoglobin 8.6*; Platelets 501*; Potassium 3.5; Sodium 137 11/23/2014: TSH 2.439    Lipid Panel No results found for: CHOL, TRIG, HDL, CHOLHDL, VLDL, LDLCALC, LDLDIRECT    ASSESSMENT AND PLAN:  Chest pain, unspecified chest pain type Atypical.  But, she needs to be cleared for upcoming TAH.  She has HTN and remote FHx of CAD.  I will arrange an ETT-Echo.  If this is unremarkable, no further workup.  Essential hypertension Fair control.  Continue to monitor.  Type 2 diabetes mellitus without complication FU with PCP.  Hypothyroidism, unspecified hypothyroidism type FU with PCP.  Pre-operative cardiovascular examination  As noted, proceed with stress testing.  If low risk, she may proceed with her TAH at acceptable risk.   Current medicines are reviewed at length with the patient today.  The patient does not have concerns regarding medicines.  The following changes have been made:  no change  Labs/ tests ordered today include:   Orders Placed This Encounter  Procedures  . EKG 12-Lead  . Echocardiogram stress test without contrast    Disposition:   FU with Dr. Cleatis Polka prn or sooner if stress test abnormal.    Signed, Richardson Dopp, PA-C, MHS 12/27/2014 8:37 AM    [facility] [place], [place], [place]  [address] Phone: [phone]; Fax: [phone]

## 2014-12-29 ENCOUNTER — Ambulatory Visit (HOSPITAL_COMMUNITY): Payer: BLUE CROSS/BLUE SHIELD | Attending: Cardiovascular Disease

## 2014-12-29 DIAGNOSIS — R079 Chest pain, unspecified: Secondary | ICD-10-CM

## 2014-12-29 NOTE — Progress Notes (Signed)
Stress Echocardiogram performed.  

## 2014-12-31 ENCOUNTER — Encounter: Payer: Self-pay | Admitting: Physician Assistant

## 2014-12-31 ENCOUNTER — Telehealth: Payer: Self-pay | Admitting: *Deleted

## 2014-12-31 NOTE — Telephone Encounter (Signed)
pt notified of normal myoview with verbal understanding

## 2015-01-03 ENCOUNTER — Encounter: Payer: BLUE CROSS/BLUE SHIELD | Admitting: Physician Assistant

## 2015-01-10 ENCOUNTER — Encounter (HOSPITAL_COMMUNITY)
Admission: RE | Admit: 2015-01-10 | Discharge: 2015-01-10 | Disposition: A | Discharge status: Home / Self Care | Payer: BLUE CROSS/BLUE SHIELD | Source: Ambulatory Visit | Attending: Obstetrics and Gynecology | Admitting: Obstetrics and Gynecology

## 2015-01-10 ENCOUNTER — Encounter (HOSPITAL_COMMUNITY): Payer: Self-pay

## 2015-01-10 DIAGNOSIS — Z01812 Encounter for preprocedural laboratory examination: Secondary | ICD-10-CM | POA: Diagnosis present

## 2015-01-10 DIAGNOSIS — D259 Leiomyoma of uterus, unspecified: Secondary | ICD-10-CM | POA: Insufficient documentation

## 2015-01-10 HISTORY — DX: Anemia, unspecified: D64.9

## 2015-01-10 LAB — TYPE AND SCREEN
ABO/RH(D): B POS
ANTIBODY SCREEN: NEGATIVE

## 2015-01-10 LAB — CBC
HEMATOCRIT: 34.6 % — AB (ref 36.0–46.0)
Hemoglobin: 11.1 g/dL — ABNORMAL LOW (ref 12.0–15.0)
MCH: 25 pg — AB (ref 26.0–34.0)
MCHC: 32.1 g/dL (ref 30.0–36.0)
MCV: 77.9 fL — AB (ref 78.0–100.0)
PLATELETS: 375 10*3/uL (ref 150–400)
RBC: 4.44 MIL/uL (ref 3.87–5.11)
RDW: 24.8 % — AB (ref 11.5–15.5)
WBC: 8.8 10*3/uL (ref 4.0–10.5)

## 2015-01-10 LAB — BASIC METABOLIC PANEL
Anion gap: 10 (ref 5–15)
BUN: 11 mg/dL (ref 6–23)
CO2: 23 mmol/L (ref 19–32)
Calcium: 9 mg/dL (ref 8.4–10.5)
Chloride: 106 mmol/L (ref 96–112)
Creatinine, Ser: 0.88 mg/dL (ref 0.50–1.10)
GFR, EST NON AFRICAN AMERICAN: 79 mL/min — AB (ref >90)
Glucose, Bld: 116 mg/dL — ABNORMAL HIGH (ref 70–99)
Potassium: 3.5 mmol/L (ref 3.5–5.1)
SODIUM: 139 mmol/L (ref 135–145)

## 2015-01-10 LAB — ABO/RH: ABO/RH(D): B POS

## 2015-01-10 NOTE — Patient Instructions (Signed)
   Your procedure is scheduled on: April 7 AT 730AM  Enter through the Main Entrance of [facility] at:6AM  Pick up the phone at the desk and dial [phone] and inform us of your arrival.  Please call this number if you have any problems the morning of surgery: [phone]  Remember: DO NOT EAT OR DRINK LIQUIDS AFTER April 6  Take these medicines the morning of surgery with a SIP OF WATER: TAKE ATENOLOL AM OF SURGERY  Do not wear jewelry, make-up, or FINGER nail polish No metal in your hair or on your body. Do not wear lotions, powders, perfumes.  You may wear deodorant.  Do not bring valuables to the hospital. Contacts, dentures or bridgework may not be worn into surgery.  Leave suitcase in the car. After Surgery it may be brought to your room. For patients being admitted to the hospital, checkout time is 11:00am the day of discharge.

## 2015-01-17 NOTE — H&P (Signed)
Janet Marquez is an 44 y.o. female with heavy irregular menses and secondary anemia. She received IV iron infusion x 2. U/S in office C/W uterus 8.8 x 4.9 x 6.0 cm with fibroids 1.5 to 3.0 cm. EM biopsy productive of benign endocervical tissue. S/P abdominal myomectomy and subsequent cesarean section.  Pertinent Gynecological History: Menses: flow is excessive with use of many pads or tampons on heaviest days Bleeding: dysfunctional uterine bleeding Contraception: none DES exposure: denies Blood transfusions: none Sexually transmitted diseases: no past history Previous GYN Procedures: uterine myomectomy  Last mammogram: normal Date: 2015 Last pap: normal Date: 2016 OB History: G1, P1   Menstrual History: Menarche age: unknown  No LMP recorded.    Past Medical History  Diagnosis Date  . Hypertension   . Thyroid disease   . Hx of cardiovascular stress test     ETT-echo 3/16: Normal  . Anemia     Past Surgical History  Procedure Laterality Date  . Myomectomy      2002  . Cesarean section      Family History  Problem Relation Age of Onset  . Hypertension Maternal Grandmother   . Heart attack Maternal Grandfather   . Stroke Maternal Grandmother     Social History:  reports that she has never smoked. She has never used smokeless tobacco. She reports that she does not drink alcohol. Her drug history is not on file.  Allergies:  Allergies  Allergen Reactions  . Clarithromycin Nausea And Vomiting    No prescriptions prior to admission    Review of Systems  Constitutional: Negative for fever.    There were no vitals taken for this visit. Physical Exam  Cardiovascular: Normal rate and regular rhythm.   Respiratory: Effort normal and breath sounds normal.  GI: Soft. Bowel sounds are normal.  Genitourinary:  Uterus irregular Adnexa compromised by uterus    No results found for this or any previous visit (from the past 24 hour(s)).  No results  found.  Assessment/Plan: 44 yo G1P1 with menometrorrhagia and fibroids  LAVH/ bilateral salpingectomy, possible TAH  D/W patient risks including infection, organ damage, bleeding/transfusion-HIV/Hep, DVT/PE, pneumonia, fistula, pelvic pain, abdominal pain, painful intercourse, laparotomy, return to [place] E 01/17/2015, 6:11 PM

## 2015-01-19 MED ORDER — CEFAZOLIN SODIUM-DEXTROSE 2-3 GM-% IV SOLR
2.0000 g | INTRAVENOUS | Status: AC
  Administered 2015-01-20: 2 g via INTRAVENOUS

## 2015-01-20 ENCOUNTER — Observation Stay (HOSPITAL_COMMUNITY)
Admission: RE | Admit: 2015-01-20 | Discharge: 2015-01-21 | Disposition: A | Discharge status: Home / Self Care | Payer: BLUE CROSS/BLUE SHIELD | Source: Ambulatory Visit | Attending: Obstetrics and Gynecology | Admitting: Obstetrics and Gynecology

## 2015-01-20 ENCOUNTER — Ambulatory Visit (HOSPITAL_COMMUNITY): Payer: BLUE CROSS/BLUE SHIELD | Admitting: Anesthesiology

## 2015-01-20 ENCOUNTER — Encounter (HOSPITAL_COMMUNITY)
Admission: RE | Disposition: A | Discharge status: Home / Self Care | Payer: Self-pay | Source: Ambulatory Visit | Attending: Obstetrics and Gynecology

## 2015-01-20 ENCOUNTER — Encounter (HOSPITAL_COMMUNITY): Payer: Self-pay | Admitting: *Deleted

## 2015-01-20 DIAGNOSIS — N809 Endometriosis, unspecified: Secondary | ICD-10-CM | POA: Diagnosis not present

## 2015-01-20 DIAGNOSIS — D259 Leiomyoma of uterus, unspecified: Secondary | ICD-10-CM | POA: Diagnosis not present

## 2015-01-20 DIAGNOSIS — D219 Benign neoplasm of connective and other soft tissue, unspecified: Secondary | ICD-10-CM | POA: Diagnosis present

## 2015-01-20 DIAGNOSIS — I10 Essential (primary) hypertension: Secondary | ICD-10-CM | POA: Insufficient documentation

## 2015-01-20 HISTORY — PX: LAPAROSCOPIC ASSISTED VAGINAL HYSTERECTOMY: SHX5398

## 2015-01-20 HISTORY — PX: BILATERAL SALPINGECTOMY: SHX5743

## 2015-01-20 LAB — TYPE AND SCREEN
ABO/RH(D): B POS
Antibody Screen: NEGATIVE

## 2015-01-20 SURGERY — HYSTERECTOMY, VAGINAL, LAPAROSCOPY-ASSISTED
Anesthesia: General | Site: Abdomen

## 2015-01-20 MED ORDER — HYDROMORPHONE HCL 1 MG/ML IJ SOLN
INTRAMUSCULAR | Status: DC | PRN
  Administered 2015-01-20 (×2): 0.5 mg via INTRAVENOUS

## 2015-01-20 MED ORDER — ATENOLOL 12.5 MG HALF TABLET
12.5000 mg | ORAL_TABLET | Freq: Every day | ORAL | Status: DC
  Administered 2015-01-21: 12.5 mg via ORAL
  Filled 2015-01-20 (×2): qty 1

## 2015-01-20 MED ORDER — OXYCODONE-ACETAMINOPHEN 5-325 MG PO TABS
1.0000 | ORAL_TABLET | ORAL | Status: DC | PRN
  Administered 2015-01-20 – 2015-01-21 (×3): 1 via ORAL
  Filled 2015-01-20 (×3): qty 1

## 2015-01-20 MED ORDER — 0.9 % SODIUM CHLORIDE (POUR BTL) OPTIME
TOPICAL | Status: DC | PRN
  Administered 2015-01-20 (×2): 1000 mL

## 2015-01-20 MED ORDER — DEXAMETHASONE SODIUM PHOSPHATE 10 MG/ML IJ SOLN
INTRAMUSCULAR | Status: AC
  Filled 2015-01-20: qty 1

## 2015-01-20 MED ORDER — DIPHENHYDRAMINE HCL 12.5 MG/5ML PO ELIX
12.5000 mg | ORAL_SOLUTION | Freq: Four times a day (QID) | ORAL | Status: DC | PRN
  Filled 2015-01-20: qty 5

## 2015-01-20 MED ORDER — ONDANSETRON HCL 4 MG PO TABS: 4.0000 mg | ORAL_TABLET | Freq: Four times a day (QID) | ORAL | Status: DC | PRN

## 2015-01-20 MED ORDER — CEFAZOLIN SODIUM-DEXTROSE 2-3 GM-% IV SOLR
INTRAVENOUS | Status: AC
  Filled 2015-01-20: qty 50

## 2015-01-20 MED ORDER — GLYCOPYRROLATE 0.2 MG/ML IJ SOLN
INTRAMUSCULAR | Status: AC
  Filled 2015-01-20: qty 4

## 2015-01-20 MED ORDER — LACTATED RINGERS IV SOLN
INTRAVENOUS | Status: DC
  Administered 2015-01-20 (×2): via INTRAVENOUS

## 2015-01-20 MED ORDER — GLYCERIN-HYPROMELLOSE-PEG 400 0.2-0.2-1 % OP SOLN
1.0000 [drp] | Freq: Three times a day (TID) | OPHTHALMIC | Status: DC
  Administered 2015-01-20 – 2015-01-21 (×2): 1 [drp] via OPHTHALMIC
  Filled 2015-01-20 (×7): qty 15

## 2015-01-20 MED ORDER — BUPIVACAINE HCL (PF) 0.5 % IJ SOLN
INTRAMUSCULAR | Status: AC
  Filled 2015-01-20: qty 30

## 2015-01-20 MED ORDER — ONDANSETRON HCL 4 MG/2ML IJ SOLN
INTRAMUSCULAR | Status: DC | PRN
  Administered 2015-01-20: 4 mg via INTRAVENOUS

## 2015-01-20 MED ORDER — MIDAZOLAM HCL 5 MG/5ML IJ SOLN
INTRAMUSCULAR | Status: DC | PRN
  Administered 2015-01-20: 2 mg via INTRAVENOUS

## 2015-01-20 MED ORDER — BUPIVACAINE HCL (PF) 0.5 % IJ SOLN
INTRAMUSCULAR | Status: DC | PRN
  Administered 2015-01-20: 5 mL
  Administered 2015-01-20: 20 mL

## 2015-01-20 MED ORDER — SODIUM CHLORIDE 0.9 % IJ SOLN: 9.0000 mL | INTRAMUSCULAR | Status: DC | PRN

## 2015-01-20 MED ORDER — HEPARIN SODIUM (PORCINE) 5000 UNIT/ML IJ SOLN
INTRAMUSCULAR | Status: AC
  Filled 2015-01-20: qty 1

## 2015-01-20 MED ORDER — ACETAMINOPHEN 160 MG/5ML PO SOLN
ORAL | Status: AC
  Filled 2015-01-20: qty 40.6

## 2015-01-20 MED ORDER — ROCURONIUM BROMIDE 100 MG/10ML IV SOLN
INTRAVENOUS | Status: DC | PRN
  Administered 2015-01-20: 10 mg via INTRAVENOUS
  Administered 2015-01-20: 40 mg via INTRAVENOUS

## 2015-01-20 MED ORDER — ONDANSETRON HCL 4 MG/2ML IJ SOLN: 4.0000 mg | Freq: Four times a day (QID) | INTRAMUSCULAR | Status: DC | PRN

## 2015-01-20 MED ORDER — ACETAMINOPHEN 160 MG/5ML PO SOLN
975.0000 mg | Freq: Once | ORAL | Status: AC
  Administered 2015-01-20: 975 mg via ORAL

## 2015-01-20 MED ORDER — CARBOXYMETHYLCELLULOSE SODIUM 1 % OP SOLN: 1.0000 [drp] | Freq: Three times a day (TID) | OPHTHALMIC | Status: DC

## 2015-01-20 MED ORDER — IBUPROFEN 600 MG PO TABS: 600.0000 mg | ORAL_TABLET | Freq: Four times a day (QID) | ORAL | Status: DC | PRN

## 2015-01-20 MED ORDER — LACTATED RINGERS IR SOLN
Status: DC | PRN
  Administered 2015-01-20: 3000 mL

## 2015-01-20 MED ORDER — NEOSTIGMINE METHYLSULFATE 10 MG/10ML IV SOLN
INTRAVENOUS | Status: DC | PRN
  Administered 2015-01-20: 2.5 mg via INTRAVENOUS

## 2015-01-20 MED ORDER — DIPHENHYDRAMINE HCL 50 MG/ML IJ SOLN: 12.5000 mg | Freq: Four times a day (QID) | INTRAMUSCULAR | Status: DC | PRN

## 2015-01-20 MED ORDER — FENTANYL CITRATE 0.05 MG/ML IJ SOLN
INTRAMUSCULAR | Status: DC | PRN
  Administered 2015-01-20 (×3): 50 ug via INTRAVENOUS
  Administered 2015-01-20: 100 ug via INTRAVENOUS

## 2015-01-20 MED ORDER — LIDOCAINE HCL (CARDIAC) 20 MG/ML IV SOLN
INTRAVENOUS | Status: AC
  Filled 2015-01-20: qty 5

## 2015-01-20 MED ORDER — LIDOCAINE HCL (CARDIAC) 20 MG/ML IV SOLN
INTRAVENOUS | Status: DC | PRN
  Administered 2015-01-20: 50 mg via INTRAVENOUS

## 2015-01-20 MED ORDER — SENNA 8.6 MG PO TABS
1.0000 | ORAL_TABLET | Freq: Two times a day (BID) | ORAL | Status: DC
  Administered 2015-01-20 – 2015-01-21 (×2): 8.6 mg via ORAL
  Filled 2015-01-20 (×5): qty 1

## 2015-01-20 MED ORDER — DEXAMETHASONE SODIUM PHOSPHATE 10 MG/ML IJ SOLN
INTRAMUSCULAR | Status: DC | PRN
  Administered 2015-01-20: 10 mg via INTRAVENOUS

## 2015-01-20 MED ORDER — HYDROMORPHONE HCL 1 MG/ML IJ SOLN: 0.2500 mg | INTRAMUSCULAR | Status: DC | PRN

## 2015-01-20 MED ORDER — HYDROMORPHONE HCL 1 MG/ML IJ SOLN
INTRAMUSCULAR | Status: AC
  Filled 2015-01-20: qty 1

## 2015-01-20 MED ORDER — SCOPOLAMINE 1 MG/3DAYS TD PT72
MEDICATED_PATCH | TRANSDERMAL | Status: AC
  Administered 2015-01-20: 1.5 mg via TRANSDERMAL
  Filled 2015-01-20: qty 1

## 2015-01-20 MED ORDER — NEOSTIGMINE METHYLSULFATE 10 MG/10ML IV SOLN
INTRAVENOUS | Status: AC
  Filled 2015-01-20: qty 1

## 2015-01-20 MED ORDER — MENTHOL 3 MG MT LOZG
1.0000 | LOZENGE | OROMUCOSAL | Status: DC | PRN
  Administered 2015-01-20: 3 mg via ORAL
  Filled 2015-01-20 (×2): qty 9

## 2015-01-20 MED ORDER — ONDANSETRON HCL 4 MG/2ML IJ SOLN
INTRAMUSCULAR | Status: AC
  Filled 2015-01-20: qty 2

## 2015-01-20 MED ORDER — ZOLPIDEM TARTRATE 5 MG PO TABS: 5.0000 mg | ORAL_TABLET | Freq: Every evening | ORAL | Status: DC | PRN

## 2015-01-20 MED ORDER — FENTANYL CITRATE 0.05 MG/ML IJ SOLN
INTRAMUSCULAR | Status: AC
  Filled 2015-01-20: qty 5

## 2015-01-20 MED ORDER — MIDAZOLAM HCL 2 MG/2ML IJ SOLN
INTRAMUSCULAR | Status: AC
  Filled 2015-01-20: qty 2

## 2015-01-20 MED ORDER — LACTATED RINGERS IV SOLN
INTRAVENOUS | Status: DC
  Administered 2015-01-20 (×3): via INTRAVENOUS

## 2015-01-20 MED ORDER — SCOPOLAMINE 1 MG/3DAYS TD PT72
1.0000 | MEDICATED_PATCH | Freq: Once | TRANSDERMAL | Status: DC
  Administered 2015-01-20: 1.5 mg via TRANSDERMAL

## 2015-01-20 MED ORDER — PROPOFOL 10 MG/ML IV BOLUS
INTRAVENOUS | Status: AC
  Filled 2015-01-20: qty 20

## 2015-01-20 MED ORDER — ALUM & MAG HYDROXIDE-SIMETH 200-200-20 MG/5ML PO SUSP: 30.0000 mL | ORAL | Status: DC | PRN

## 2015-01-20 MED ORDER — HYDROMORPHONE 0.3 MG/ML IV SOLN
INTRAVENOUS | Status: DC
  Administered 2015-01-20 (×2): 0.4 mg via INTRAVENOUS
  Administered 2015-01-20: 0.799 mg via INTRAVENOUS
  Administered 2015-01-20: 0.2 mg via INTRAVENOUS
  Administered 2015-01-20: 11:00:00 via INTRAVENOUS
  Filled 2015-01-20: qty 25

## 2015-01-20 MED ORDER — GLYCOPYRROLATE 0.2 MG/ML IJ SOLN
INTRAMUSCULAR | Status: DC | PRN
  Administered 2015-01-20: .5 mg via INTRAVENOUS

## 2015-01-20 MED ORDER — NALOXONE HCL 0.4 MG/ML IJ SOLN: 0.4000 mg | INTRAMUSCULAR | Status: DC | PRN

## 2015-01-20 MED ORDER — PROPOFOL 10 MG/ML IV BOLUS
INTRAVENOUS | Status: DC | PRN
  Administered 2015-01-20: 150 mg via INTRAVENOUS

## 2015-01-20 SURGICAL SUPPLY — 50 items
CABLE HIGH FREQUENCY MONO STRZ (ELECTRODE) IMPLANT
CATH ROBINSON RED A/P 16FR (CATHETERS) ×4 IMPLANT
CLOSURE WOUND 1/4 X3 (GAUZE/BANDAGES/DRESSINGS)
CLOTH BEACON ORANGE TIMEOUT ST (SAFETY) ×4 IMPLANT
CONT PATH 16OZ SNAP LID 3702 (MISCELLANEOUS) ×4 IMPLANT
COVER BACK TABLE 60X90IN (DRAPES) ×4 IMPLANT
DECANTER SPIKE VIAL GLASS SM (MISCELLANEOUS) IMPLANT
DRSG COVADERM PLUS 2X2 (GAUZE/BANDAGES/DRESSINGS) IMPLANT
DRSG OPSITE POSTOP 3X4 (GAUZE/BANDAGES/DRESSINGS) ×4 IMPLANT
DURAPREP 26ML APPLICATOR (WOUND CARE) ×4 IMPLANT
ELECT LIGASURE LONG (ELECTRODE) ×4 IMPLANT
ELECT REM PT RETURN 9FT ADLT (ELECTROSURGICAL) ×4
ELECTRODE REM PT RTRN 9FT ADLT (ELECTROSURGICAL) ×2 IMPLANT
FILTER SMOKE EVAC LAPAROSHD (FILTER) ×4 IMPLANT
GLOVE BIO SURGEON STRL SZ7 (GLOVE) ×4 IMPLANT
GLOVE BIO SURGEON STRL SZ7.5 (GLOVE) ×8 IMPLANT
GLOVE BIOGEL PI IND STRL 6.5 (GLOVE) ×2 IMPLANT
GLOVE BIOGEL PI IND STRL 7.0 (GLOVE) ×6 IMPLANT
GLOVE BIOGEL PI IND STRL 7.5 (GLOVE) ×2 IMPLANT
GLOVE BIOGEL PI IND STRL 8 (GLOVE) ×2 IMPLANT
GLOVE BIOGEL PI INDICATOR 6.5 (GLOVE) ×2
GLOVE BIOGEL PI INDICATOR 7.0 (GLOVE) ×6
GLOVE BIOGEL PI INDICATOR 7.5 (GLOVE) ×2
GLOVE BIOGEL PI INDICATOR 8 (GLOVE) ×2
GLOVE ECLIPSE 7.0 STRL STRAW (GLOVE) ×4 IMPLANT
LIQUID BAND (GAUZE/BANDAGES/DRESSINGS) ×4 IMPLANT
NEEDLE INSUFFLATION 120MM (ENDOMECHANICALS) ×4 IMPLANT
NS IRRIG 1000ML POUR BTL (IV SOLUTION) ×4 IMPLANT
PACK LAVH (CUSTOM PROCEDURE TRAY) ×4 IMPLANT
PACK ROBOTIC GOWN (GOWN DISPOSABLE) ×4 IMPLANT
PAD POSITIONER PINK NONSTERILE (MISCELLANEOUS) ×4 IMPLANT
PROTECTOR NERVE ULNAR (MISCELLANEOUS) IMPLANT
SCISSORS LAP 5X45 EPIX DISP (ENDOMECHANICALS) IMPLANT
SEALER TISSUE G2 CVD JAW 45CM (ENDOMECHANICALS) ×4 IMPLANT
SET CYSTO W/LG BORE CLAMP LF (SET/KITS/TRAYS/PACK) IMPLANT
SET IRRIG TUBING LAPAROSCOPIC (IRRIGATION / IRRIGATOR) ×4 IMPLANT
STRIP CLOSURE SKIN 1/4X3 (GAUZE/BANDAGES/DRESSINGS) IMPLANT
SUT MNCRL 0 MO-4 VIOLET 18 CR (SUTURE) ×4 IMPLANT
SUT MNCRL 0 VIOLET 6X18 (SUTURE) ×2 IMPLANT
SUT MONOCRYL 0 6X18 (SUTURE) ×2
SUT MONOCRYL 0 MO 4 18  CR/8 (SUTURE) ×4
SUT VIC AB 4-0 PS2 27 (SUTURE) ×4 IMPLANT
SUT VICRYL 0 UR6 27IN ABS (SUTURE) IMPLANT
SYR 30ML LL (SYRINGE) ×4 IMPLANT
TOWEL OR 17X24 6PK STRL BLUE (TOWEL DISPOSABLE) ×8 IMPLANT
TRAY FOLEY CATH SILVER 14FR (SET/KITS/TRAYS/PACK) ×4 IMPLANT
TROCAR XCEL NON-BLD 11X100MML (ENDOMECHANICALS) ×4 IMPLANT
TROCAR XCEL NON-BLD 5MMX100MML (ENDOMECHANICALS) ×4 IMPLANT
WARMER LAPAROSCOPE (MISCELLANEOUS) ×4 IMPLANT
WATER STERILE IRR 1000ML POUR (IV SOLUTION) ×4 IMPLANT

## 2015-01-20 NOTE — Progress Notes (Signed)
Tolerating regular diet at lunch and supper Passing flatus Ambulating Good pain relief  VSS Afeb UO dilute Lungs CTA Cor RRR Abd soft, good BS Ext PAS on  A/P: D/C PCA         IV to saline lock         D/C foley

## 2015-01-20 NOTE — Brief Op Note (Signed)
01/20/2015  9:01 AM  PATIENT:  Janet Marquez  44 y.o. female  PRE-OPERATIVE DIAGNOSIS:  fibroids  POST-OPERATIVE DIAGNOSIS:  Fibroids, endometriosis  PROCEDURE:  Procedure(s): LAPAROSCOPIC ASSISTED VAGINAL HYSTERECTOMY (N/A) LAPAROSCOPIC LEFT SALPINGECTOMY (Left) Ablation of endometriosis SURGEON:  Surgeon(s) and Role:    * Everlene Farrier, MD - Primary    * W Evette Cristal, MD - Assisting  PHYSICIAN ASSISTANT:   ASSISTANTS: Neal   ANESTHESIA:   general  EBL:  Total I/O In: 2000 [I.V.:2000] Out: 450 [Urine:150; Blood:300]  BLOOD ADMINISTERED:none  DRAINS: Urinary Catheter (Foley)   LOCAL MEDICATIONS USED:  MARCAINE    and Amount: 26 ml  SPECIMEN:  Source of Specimen:  uterus and left fallopian tube  DISPOSITION OF SPECIMEN:  PATHOLOGY  COUNTS:  YES  TOURNIQUET:  * No tourniquets in log *  DICTATION: .Other Dictation: Dictation Number [id number]  PLAN OF CARE: Admit for overnight observation  PATIENT DISPOSITION:  PACU - hemodynamically stable.   Delay start of Pharmacological VTE agent (>24hrs) due to surgical blood loss or risk of bleeding: not applicable

## 2015-01-20 NOTE — Progress Notes (Signed)
No changes to H&P per patient history Reviewed with patient prodecure-LAVH/bilat salpingectomy, poss TAH All questions answered, patient states she understands and agrees

## 2015-01-20 NOTE — Addendum Note (Signed)
Addendum  created 01/20/15 1300 by Raenette Rover, CRNA   Modules edited: Notes Section   Notes Section:  File: [id number]

## 2015-01-20 NOTE — Transfer of Care (Signed)
Immediate Anesthesia Transfer of Care Note  Patient: Janet Marquez  Procedure(s) Performed: Procedure(s): LAPAROSCOPIC ASSISTED VAGINAL HYSTERECTOMY (N/A) LAPAROSCOPIC LEFT SALPINGECTOMY (Left)  Patient Location: PACU  Anesthesia Type:General  Level of Consciousness: awake and sedated  Airway & Oxygen Therapy: Patient Spontanous Breathing and Patient connected to nasal cannula oxygen  Post-op Assessment: Report given to RN and Post -op Vital signs reviewed and stable  Post vital signs: stable  Last Vitals:  Filed Vitals:   01/20/15 0554  BP: 133/80  Pulse:   Temp:   Resp:     Complications: No apparent anesthesia complications

## 2015-01-20 NOTE — Anesthesia Postprocedure Evaluation (Signed)
  Anesthesia Post-op Note  Patient: Janet Marquez  Procedure(s) Performed: Procedure(s): LAPAROSCOPIC ASSISTED VAGINAL HYSTERECTOMY (N/A) LAPAROSCOPIC LEFT SALPINGECTOMY (Left)  Patient Location: [facility]  Anesthesia Type:General  Level of Consciousness: awake, alert , oriented and patient cooperative  Airway and Oxygen Therapy: Patient Spontanous Breathing and Patient connected to nasal cannula oxygen  Post-op Pain: none  Post-op Assessment: Post-op Vital signs reviewed, Patient's Cardiovascular Status Stable, Respiratory Function Stable, Patent Airway and No signs of Nausea or vomiting  Post-op Vital Signs: Reviewed and stable  Last Vitals:  Filed Vitals:   01/20/15 1200  BP: 136/78  Pulse: 80  Temp: 36.6 C  Resp: 20    Complications: No apparent anesthesia complications

## 2015-01-20 NOTE — Op Note (Signed)
Janet Marquez, Janet Marquez             ACCOUNT NO.:  [id number]  MEDICAL RECORD NO.:  [id number]  LOCATION:  [facility]                          FACILITY:  [facility]  PHYSICIAN:  Daleen Bo. Gaetano Net, M.D. DATE OF BIRTH:  [birth year]  DATE OF PROCEDURE:  01/20/2015 DATE OF DISCHARGE:                              OPERATIVE REPORT   PREOPERATIVE DIAGNOSIS:  Uterine leiomyomata.  POSTOPERATIVE DIAGNOSES: 1. Uterine leiomyomata. 2. Endometriosis.  PROCEDURE:  Laparoscopically-assisted vaginal hysterectomy.  Left salpingectomy and ablation of endometriosis.  SURGEON:  Daleen Bo. Gaetano Net, M.D.  ASSISTANT:  Evette Cristal, M.D.  ANESTHESIA:  General endotracheal intubation.  ESTIMATED BLOOD LOSS:  300 mL.  SPECIMENS:  Uterus and left fallopian tube to Pathology.  INDICATIONS AND CONSENT:  This patient is a 44 year old patient with previous abdominal uterine myomectomy, cesarean section, now with recurrent symptomatic uterine leiomyomata.  Laparoscopically assisted vaginal hysterectomy, bilateral salpingectomy, possible total abdominal hysterectomy has been discussed preoperatively.  Potential risks and complications have been discussed preoperatively, including but not limited to infection, organ damage, bleeding requiring transfusion of blood products with HIV and hepatitis acquisition, DVT, PE, pneumonia, fistula formation, laparotomy, return to the operating room, pelvic pain, abdominal pain, and painful intercourse.  All questions have been answered and consent is signed on the chart.  The patient states she understands and agrees.  FINDINGS:  Upper abdomen is grossly normal.  In the pelvis, the anterior cul-de-sac is normal.  The uterus is acutely retroflexed into the posterior cul-de-sac.  It is about 10 weeks in size.  Ovaries are normal.  Left fallopian tube is normal.  Right fallopian tube is adherent at its distal portion to the ovary.  Posterior cul-de-sac contains 2-3 mm black implants  consistent with endometriosis.  PROCEDURE IN DETAIL:  The patient was taken to the operating room where she is identified, placed in dorsal supine position; and general anesthesia is induced via endotracheal intubation.  She was placed in a dorsal lithotomy position.  A time-out was undertaken.  She was prepped abdominally and vaginally per [facility] protocol.  Bladder is straight catheterized.  Hulka tenaculum is placed in the uterus as a manipulator and she is draped in a sterile fashion.  The infraumbilical and suprapubic areas were injected in the midline with approximately 6 mL of 0.5% plain Marcaine.  A small infraumbilical incision was made. Disposable Veress needle was placed.  Good syringe and drop test were noted.  A 2 L of gas were then insufflated under low pressure with good tympany in the right upper quadrant.  Veress needle was removed and a 10/11 Xcel bladeless disposable trocar sleeve was placed using direct visualization with diagnostic laparoscope.  Following this, the operative scope was used.  Small suprapubic incision was made in the midline and a 5-mm disposable trocar sleeve was placed under direct visualization without difficulty.  The uterus was elevated with a grasper which elevates the fundus.  Due to the adherent right fallopian tube, it was felt that a salpingectomy in this side could easily lead to damage to the infundibulopelvic ligament and therefore removal of that ovary.  The patient wanted to leave ovaries unless they are abnormal. Therefore, the left mesosalpinx is taken  down with the EnSeal bipolar cautery cutting instrument, coming down across the proximal ligaments down to the level of vesicouterine peritoneum.  On the right side, proximal ligaments were taken down to the level of the vesicouterine peritoneum leaving the right fallopian tube.  The vesicouterine peritoneum was taken down cephalad laterally.  Good hemostasis is  noted. Attention was then turned to the vagina.  Posterior cul-de-sac is sharply entered with scissors.  Cervix is circumscribed with unipolar cautery.  Mucosa is advanced sharply and bluntly.  Progressive bites were taken with the handheld LigaSure bipolar cautery.  The fundus was then delivered posteriorly and the remaining anterior peritoneum is taken down under good visualization without difficulty.  All suture will be 0 Monocryl unless otherwise designated.  Uterosacral ligaments are then plicated to the vaginal cuff and then plicated in the midline with a third suture.  Cuff was closed with figure-of-eights.  Foley catheter is placed in the bladder and clear urine is noted.  Attention was returned to the abdomen.  Bleeders were controlled with bipolar cautery. Copious irrigation is carried out.  Reduced pneumoperitoneum is used to inspect for good hemostasis.  After removing the remainder of the fluid, 20 mL of 0.5% plain Marcaine is instilled into the peritoneal cavity. Suprapubic trocar sleeve was removed.  Pneumoperitoneum was reduced, and the umbilical trocar sleeve was removed.  Skin incisions were closed with interrupted 2-0 Vicryl suture.  Glue was placed on both incisions. All counts were correct.  The patient is awakened and taken to recovery room in stable condition.     Daleen Bo Gaetano Net, M.D.     JET/MEDQ  D:  01/20/2015  T:  01/20/2015  Job:  [id number]

## 2015-01-20 NOTE — Anesthesia Postprocedure Evaluation (Signed)
  Anesthesia Post-op Note  Patient: Janet Marquez  Procedure(s) Performed: Procedure(s): LAPAROSCOPIC ASSISTED VAGINAL HYSTERECTOMY (N/A) LAPAROSCOPIC LEFT SALPINGECTOMY (Left)   Patient is awake and responsive. Pain and nausea are reasonably well controlled. Vital signs are stable and clinically acceptable. Oxygen saturation is clinically acceptable. There are no apparent anesthetic complications at this time. Patient is ready for discharge.

## 2015-01-20 NOTE — Anesthesia Preprocedure Evaluation (Signed)
Anesthesia Evaluation  Patient identified by MRN, date of birth, ID band Patient awake    Reviewed: Allergy & Precautions, H&P , Patient's Chart, lab work & pertinent test results, reviewed documented beta blocker date and time   Airway Mallampati: II TM Distance: >3 FB Neck ROM: full    Dental no notable dental hx.    Pulmonary  breath sounds clear to auscultation  Pulmonary exam normal       Cardiovascular hypertension, Rhythm:regular Rate:Normal     Neuro/Psych    GI/Hepatic   Endo/Other    Renal/GU      Musculoskeletal   Abdominal   Peds  Hematology   Anesthesia Other Findings   Reproductive/Obstetrics                           Anesthesia Physical Anesthesia Plan  ASA: II  Anesthesia Plan: General   Post-op Pain Management:    Induction: Intravenous  Airway Management Planned: Oral ETT  Additional Equipment:   Intra-op Plan:   Post-operative Plan: Extubation in OR  Informed Consent: I have reviewed the patients History and Physical, chart, labs and discussed the procedure including the risks, benefits and alternatives for the proposed anesthesia with the patient or authorized representative who has indicated his/her understanding and acceptance.   Dental Advisory Given and Dental advisory given  Plan Discussed with: CRNA and Surgeon  Anesthesia Plan Comments: (  Discussed general anesthesia, including possible nausea, instrumentation of airway, sore throat,pulmonary aspiration, etc. I asked if the were any outstanding questions, or  concerns before we proceeded. )        Anesthesia Quick Evaluation  

## 2015-01-21 ENCOUNTER — Encounter (HOSPITAL_COMMUNITY): Payer: Self-pay | Admitting: Obstetrics and Gynecology

## 2015-01-21 DIAGNOSIS — D259 Leiomyoma of uterus, unspecified: Secondary | ICD-10-CM | POA: Diagnosis not present

## 2015-01-21 LAB — CBC
HEMATOCRIT: 30 % — AB (ref 36.0–46.0)
Hemoglobin: 9.9 g/dL — ABNORMAL LOW (ref 12.0–15.0)
MCH: 25.9 pg — ABNORMAL LOW (ref 26.0–34.0)
MCHC: 33 g/dL (ref 30.0–36.0)
MCV: 78.5 fL (ref 78.0–100.0)
Platelets: 347 10*3/uL (ref 150–400)
RBC: 3.82 MIL/uL — ABNORMAL LOW (ref 3.87–5.11)
RDW: 23.4 % — ABNORMAL HIGH (ref 11.5–15.5)
WBC: 15.6 10*3/uL — ABNORMAL HIGH (ref 4.0–10.5)

## 2015-01-21 MED ORDER — IBUPROFEN 600 MG PO TABS: 600.0000 mg | ORAL_TABLET | Freq: Four times a day (QID) | ORAL | Status: DC | PRN

## 2015-01-21 MED ORDER — OXYCODONE-ACETAMINOPHEN 5-325 MG PO TABS: 1.0000 | ORAL_TABLET | Freq: Four times a day (QID) | ORAL | Status: DC | PRN

## 2015-01-21 NOTE — Discharge Instructions (Signed)
No vaginal entry, no heavy lifting No operation of automobiles

## 2015-01-21 NOTE — Discharge Summary (Signed)
Physician Discharge Summary  Patient ID: Janet Marquez MRN: [id number] DOB/AGE: 1970-10-21 44 y.o.  Admit date: 01/20/2015 Discharge date: 01/21/2015  Admission Diagnoses:fibroids  Discharge Diagnoses:  Active Problems:   Fibroids   Discharged Condition: good  Hospital Course: admitted for surgery. Good resumption of bowel and bladder function, good pain relief.  Consults: None  Significant Diagnostic Studies: labs:  Results for orders placed or performed during the hospital encounter of 01/20/15 (from the past 24 hour(s))  CBC     Status: Abnormal   Collection Time: 01/21/15  5:26 AM  Result Value Ref Range   WBC 15.6 (H) 4.0 - 10.5 K/uL   RBC 3.82 (L) 3.87 - 5.11 MIL/uL   Hemoglobin 9.9 (L) 12.0 - 15.0 g/dL   HCT 30.0 (L) 36.0 - 46.0 %   MCV 78.5 78.0 - 100.0 fL   MCH 25.9 (L) 26.0 - 34.0 pg   MCHC 33.0 30.0 - 36.0 g/dL   RDW 23.4 (H) 11.5 - 15.5 %   Platelets 347 150 - 400 K/uL    Treatments: surgery: LAVH/right salpingectomy  Discharge Exam: Blood pressure 116/77, pulse 88, temperature 98.9 F (37.2 C), temperature source Oral, resp. rate 18, height 5\' 7"  (1.702 m), weight 202 lb (91.627 kg), SpO2 100 %. General appearance: alert, cooperative and no distress GI: soft, non-tender; bowel sounds normal; no masses,  no organomegaly  Disposition: 01-Home or Self Care     Medication List    STOP taking these medications        diphenhydrAMINE 25 MG tablet  Commonly known as:  BENADRYL     polyethylene glycol powder powder  Commonly known as:  MIRALAX      TAKE these medications        acetaminophen 325 MG tablet  Commonly known as:  TYLENOL  Take 650 mg by mouth every 6 (six) hours as needed for mild pain.     atenolol 25 MG tablet  Commonly known as:  TENORMIN  Take 0.5 tablets (12.5 mg total) by mouth daily.     carboxymethylcellulose 1 % ophthalmic solution  Place 1 drop into both eyes 3 (three) times daily.     Ferrous Fumarate 324 MG Tabs   Take 1 tablet (106 mg of iron total) by mouth 3 (three) times daily.     ibuprofen 600 MG tablet  Commonly known as:  ADVIL,MOTRIN  Take 1 tablet (600 mg total) by mouth every 6 (six) hours as needed (mild pain).     oxyCODONE-acetaminophen 5-325 MG per tablet  Commonly known as:  PERCOCET/ROXICET  Take 1-2 tablets by mouth every 6 (six) hours as needed (moderate to severe pain (when tolerating fluids)).         Signed: Jareli Highland II,Harshan Kearley E 01/21/2015, 2:16 PM

## 2015-01-21 NOTE — Progress Notes (Signed)
Pt ambulated out  Teaching complete 

## 2015-01-21 NOTE — Progress Notes (Signed)
Feels good, tol regular diet, voiding, passing flatus, ambulating  VSS Afeb Abd soft, incisions healing well  Results for orders placed or performed during the hospital encounter of 01/20/15 (from the past 24 hour(s))  CBC     Status: Abnormal   Collection Time: 01/21/15  5:26 AM  Result Value Ref Range   WBC 15.6 (H) 4.0 - 10.5 K/uL   RBC 3.82 (L) 3.87 - 5.11 MIL/uL   Hemoglobin 9.9 (L) 12.0 - 15.0 g/dL   HCT 30.0 (L) 36.0 - 46.0 %   MCV 78.5 78.0 - 100.0 fL   MCH 25.9 (L) 26.0 - 34.0 pg   MCHC 33.0 30.0 - 36.0 g/dL   RDW 23.4 (H) 11.5 - 15.5 %   Platelets 347 150 - 400 K/uL   A/P: D/C home         Instructions reveiwed

## 2015-02-04 NOTE — Op Note (Signed)
NAMEDENETTE, HASS             ACCOUNT NO.:  [id number]  MEDICAL RECORD NO.:  [id number]  LOCATION:                                FACILITY:  [facility]  PHYSICIAN:  Daleen Bo. Gaetano Net, M.D. DATE OF BIRTH:  1970/11/18  DATE OF PROCEDURE:  01/20/2015 DATE OF DISCHARGE:  01/21/2015                              OPERATIVE REPORT   ADDENDUM  At laparoscopy, the endometrial implants in the posterior cul-de-sac were cauterized with bipolar cautery.     Daleen Bo Gaetano Net, M.D.     JET/MEDQ  D:  02/03/2015  T:  02/04/2015  Job:  [id number]

## 2015-03-07 ENCOUNTER — Ambulatory Visit (INDEPENDENT_AMBULATORY_CARE_PROVIDER_SITE_OTHER): Payer: BLUE CROSS/BLUE SHIELD | Admitting: Internal Medicine

## 2015-03-07 VITALS — BP 164/108 | HR 87 | Temp 99.0°F | Resp 16 | Ht 66.25 in | Wt 205.0 lb

## 2015-03-07 DIAGNOSIS — D509 Iron deficiency anemia, unspecified: Secondary | ICD-10-CM

## 2015-03-07 DIAGNOSIS — Z7189 Other specified counseling: Secondary | ICD-10-CM

## 2015-03-07 DIAGNOSIS — I10 Essential (primary) hypertension: Secondary | ICD-10-CM | POA: Diagnosis not present

## 2015-03-07 DIAGNOSIS — J0140 Acute pansinusitis, unspecified: Secondary | ICD-10-CM | POA: Diagnosis not present

## 2015-03-07 LAB — BASIC METABOLIC PANEL
BUN: 5 mg/dL — AB (ref 6–23)
CO2: 25 mEq/L (ref 19–32)
CREATININE: 0.86 mg/dL (ref 0.50–1.10)
Calcium: 9.4 mg/dL (ref 8.4–10.5)
Chloride: 105 mEq/L (ref 96–112)
GLUCOSE: 114 mg/dL — AB (ref 70–99)
POTASSIUM: 4 meq/L (ref 3.5–5.3)
Sodium: 139 mEq/L (ref 135–145)

## 2015-03-07 LAB — POCT CBC
Granulocyte percent: 73.7 %G (ref 37–80)
HEMATOCRIT: 37.8 % (ref 37.7–47.9)
Hemoglobin: 12.4 g/dL (ref 12.2–16.2)
LYMPH, POC: 2.1 (ref 0.6–3.4)
MCH, POC: 26.7 pg — AB (ref 27–31.2)
MCHC: 32.9 g/dL (ref 31.8–35.4)
MCV: 81 fL (ref 80–97)
MID (CBC): 0.5 (ref 0–0.9)
MPV: 6.6 fL (ref 0–99.8)
PLATELET COUNT, POC: 401 10*3/uL (ref 142–424)
POC Granulocyte: 7.1 — AB (ref 2–6.9)
POC LYMPH %: 21.5 % (ref 10–50)
POC MID %: 4.8 %M (ref 0–12)
RBC: 4.67 M/uL (ref 4.04–5.48)
RDW, POC: 14.4 %
WBC: 9.6 10*3/uL (ref 4.6–10.2)

## 2015-03-07 LAB — RETICULOCYTES
ABS Retic: 36.5 10*3/uL (ref 19.0–186.0)
RBC.: 4.56 MIL/uL (ref 3.87–5.11)
RETIC CT PCT: 0.8 % (ref 0.4–2.3)

## 2015-03-07 MED ORDER — AMOXICILLIN 500 MG PO CAPS: 1000.0000 mg | ORAL_CAPSULE | Freq: Two times a day (BID) | ORAL | Status: DC

## 2015-03-07 MED ORDER — CHLORTHALIDONE 25 MG PO TABS: 25.0000 mg | ORAL_TABLET | Freq: Every day | ORAL | Status: DC

## 2015-03-07 NOTE — Patient Instructions (Addendum)
Sinusitis Sinusitis is redness, soreness, and inflammation of the paranasal sinuses. Paranasal sinuses are air pockets within the bones of your face (beneath the eyes, the middle of the forehead, or above the eyes). In healthy paranasal sinuses, mucus is able to drain out, and air is able to circulate through them by way of your nose. However, when your paranasal sinuses are inflamed, mucus and air can become trapped. This can allow bacteria and other germs to grow and cause infection. Sinusitis can develop quickly and last only a short time (acute) or continue over a long period (chronic). Sinusitis that lasts for more than 12 weeks is considered chronic.  CAUSES  Causes of sinusitis include:  Allergies.  Structural abnormalities, such as displacement of the cartilage that separates your nostrils (deviated septum), which can decrease the air flow through your nose and sinuses and affect sinus drainage.  Functional abnormalities, such as when the small hairs (cilia) that line your sinuses and help remove mucus do not work properly or are not present. SIGNS AND SYMPTOMS  Symptoms of acute and chronic sinusitis are the same. The primary symptoms are pain and pressure around the affected sinuses. Other symptoms include:  Upper toothache.  Earache.  Headache.  Bad breath.  Decreased sense of smell and taste.  A cough, which worsens when you are lying flat.  Fatigue.  Fever.  Thick drainage from your nose, which often is green and may contain pus (purulent).  Swelling and warmth over the affected sinuses. DIAGNOSIS  Your health care provider will perform a physical exam. During the exam, your health care provider may:  Look in your nose for signs of abnormal growths in your nostrils (nasal polyps).  Tap over the affected sinus to check for signs of infection.  View the inside of your sinuses (endoscopy) using an imaging device that has a light attached (endoscope). If your health  care provider suspects that you have chronic sinusitis, one or more of the following tests may be recommended:  Allergy tests.  Nasal culture. A sample of mucus is taken from your nose, sent to a lab, and screened for bacteria.  Nasal cytology. A sample of mucus is taken from your nose and examined by your health care provider to determine if your sinusitis is related to an allergy. TREATMENT  Most cases of acute sinusitis are related to a viral infection and will resolve on their own within 10 days. Sometimes medicines are prescribed to help relieve symptoms (pain medicine, decongestants, nasal steroid sprays, or saline sprays).  However, for sinusitis related to a bacterial infection, your health care provider will prescribe antibiotic medicines. These are medicines that will help kill the bacteria causing the infection.  Rarely, sinusitis is caused by a fungal infection. In theses cases, your health care provider will prescribe antifungal medicine. For some cases of chronic sinusitis, surgery is needed. Generally, these are cases in which sinusitis recurs more than 3 times per year, despite other treatments. HOME CARE INSTRUCTIONS   Drink plenty of water. Water helps thin the mucus so your sinuses can drain more easily.  Use a humidifier.  Inhale steam 3 to 4 times a day (for example, sit in the bathroom with the shower running).  Apply a warm, moist washcloth to your face 3 to 4 times a day, or as directed by your health care provider.  Use saline nasal sprays to help moisten and clean your sinuses.  Take medicines only as directed by your health care provider.    If you were prescribed either an antibiotic or antifungal medicine, finish it all even if you start to feel better. SEEK IMMEDIATE MEDICAL CARE IF:  You have increasing pain or severe headaches.  You have nausea, vomiting, or drowsiness.  You have swelling around your face.  You have vision problems.  You have a stiff  neck.  You have difficulty breathing. MAKE SURE YOU:   Understand these instructions.  Will watch your condition.  Will get help right away if you are not doing well or get worse. Document Released: 10/01/2005 Document Revised: 02/15/2014 Document Reviewed: 10/16/2011 [employer] Patient Information 2015 [employer]. This information is not intended to replace advice given to you by your health care provider. Make sure you discuss any questions you have with your health care provider. Hypertension Hypertension, commonly called high blood pressure, is when the force of blood pumping through your arteries is too strong. Your arteries are the blood vessels that carry blood from your heart throughout your body. A blood pressure reading consists of a higher number over a lower number, such as 110/72. The higher number (systolic) is the pressure inside your arteries when your heart pumps. The lower number (diastolic) is the pressure inside your arteries when your heart relaxes. Ideally you want your blood pressure below 120/80. Hypertension forces your heart to work harder to pump blood. Your arteries may become narrow or stiff. Having hypertension puts you at risk for heart disease, stroke, and other problems.  RISK FACTORS Some risk factors for high blood pressure are controllable. Others are not.  Risk factors you cannot control include:   Race. You may be at higher risk if you are African American.  Age. Risk increases with age.  Gender. Men are at higher risk than women before age 45 years. After age 65, women are at higher risk than men. Risk factors you can control include:  Not getting enough exercise or physical activity.  Being overweight.  Getting too much fat, sugar, calories, or salt in your diet.  Drinking too much alcohol. SIGNS AND SYMPTOMS Hypertension does not usually cause signs or symptoms. Extremely high blood pressure (hypertensive crisis) may cause headache,  anxiety, shortness of breath, and nosebleed. DIAGNOSIS  To check if you have hypertension, your health care provider will measure your blood pressure while you are seated, with your arm held at the level of your heart. It should be measured at least twice using the same arm. Certain conditions can cause a difference in blood pressure between your right and left arms. A blood pressure reading that is higher than normal on one occasion does not mean that you need treatment. If one blood pressure reading is high, ask your health care provider about having it checked again. TREATMENT  Treating high blood pressure includes making lifestyle changes and possibly taking medicine. Living a healthy lifestyle can help lower high blood pressure. You may need to change some of your habits. Lifestyle changes may include:  Following the DASH diet. This diet is high in fruits, vegetables, and whole grains. It is low in salt, red meat, and added sugars.  Getting at least 2 hours of brisk physical activity every week.  Losing weight if necessary.  Not smoking.  Limiting alcoholic beverages.  Learning ways to reduce stress. If lifestyle changes are not enough to get your blood pressure under control, your health care provider may prescribe medicine. You may need to take more than one. Work closely with your health care provider to   understand the risks and benefits. HOME CARE INSTRUCTIONS  Have your blood pressure rechecked as directed by your health care provider.   Take medicines only as directed by your health care provider. Follow the directions carefully. Blood pressure medicines must be taken as prescribed. The medicine does not work as well when you skip doses. Skipping doses also puts you at risk for problems.   Do not smoke.   Monitor your blood pressure at home as directed by your health care provider. SEEK MEDICAL CARE IF:   You think you are having a reaction to medicines taken.  You  have recurrent headaches or feel dizzy.  You have swelling in your ankles.  You have trouble with your vision. SEEK IMMEDIATE MEDICAL CARE IF:  You develop a severe headache or confusion.  You have unusual weakness, numbness, or feel faint.  You have severe chest or abdominal pain.  You vomit repeatedly.  You have trouble breathing. MAKE SURE YOU:   Understand these instructions.  Will watch your condition.  Will get help right away if you are not doing well or get worse. Document Released: 10/01/2005 Document Revised: 02/15/2014 Document Reviewed: 07/24/2013 [facility] Patient Information 2015 [place], [place]. This information is not intended to replace advice given to you by your health care provider. Make sure you discuss any questions you have with your health care provider. DASH Eating Plan DASH stands for "Dietary Approaches to Stop Hypertension." The DASH eating plan is a healthy eating plan that has been shown to reduce high blood pressure (hypertension). Additional health benefits may include reducing the risk of type 2 diabetes mellitus, heart disease, and stroke. The DASH eating plan may also help with weight loss. WHAT DO I NEED TO KNOW ABOUT THE DASH EATING PLAN? For the DASH eating plan, you will follow these general guidelines:  Choose foods with a percent daily value for sodium of less than 5% (as listed on the food label).  Use salt-free seasonings or herbs instead of table salt or sea salt.  Check with your health care provider or pharmacist before using salt substitutes.  Eat lower-sodium products, often labeled as "lower sodium" or "no salt added."  Eat fresh foods.  Eat more vegetables, fruits, and low-fat dairy products.  Choose whole grains. Look for the word "whole" as the first word in the ingredient list.  Choose fish and skinless chicken or [place] more often than red meat. Limit fish, poultry, and meat to 6 oz (170 g) each day.  Limit sweets,  desserts, sugars, and sugary drinks.  Choose heart-healthy fats.  Limit cheese to 1 oz (28 g) per day.  Eat more home-cooked food and less restaurant, buffet, and fast food.  Limit fried foods.  Cook foods using methods other than frying.  Limit canned vegetables. If you do use them, rinse them well to decrease the sodium.  When eating at a restaurant, ask that your food be prepared with less salt, or no salt if possible. WHAT FOODS CAN I EAT? Seek help from a dietitian for individual calorie needs. Grains Whole grain or whole wheat bread. Brown rice. Whole grain or whole wheat pasta. Quinoa, bulgur, and whole grain cereals. Low-sodium cereals. Corn or whole wheat flour tortillas. Whole grain cornbread. Whole grain crackers. Low-sodium crackers. Vegetables Fresh or frozen vegetables (raw, steamed, roasted, or grilled). Low-sodium or reduced-sodium tomato and vegetable juices. Low-sodium or reduced-sodium tomato sauce and paste. Low-sodium or reduced-sodium canned vegetables.  Fruits All fresh, canned (in natural juice), or  frozen fruits. Meat and Other Protein Products Ground beef (85% or leaner), grass-fed beef, or beef trimmed of fat. Skinless chicken or [place]. Ground chicken or [place]. Pork trimmed of fat. All fish and seafood. Eggs. Dried beans, peas, or lentils. Unsalted nuts and seeds. Unsalted canned beans. Dairy Low-fat dairy products, such as skim or 1% milk, 2% or reduced-fat cheeses, low-fat ricotta or cottage cheese, or plain low-fat yogurt. Low-sodium or reduced-sodium cheeses. Fats and Oils Tub margarines without trans fats. Light or reduced-fat mayonnaise and salad dressings (reduced sodium). Avocado. Safflower, olive, or canola oils. Natural peanut or almond butter. Other Unsalted popcorn and pretzels. The items listed above may not be a complete list of recommended foods or beverages. Contact your dietitian for more options. WHAT FOODS ARE NOT  RECOMMENDED? Grains White bread. White pasta. White rice. Refined cornbread. Bagels and croissants. Crackers that contain trans fat. Vegetables Creamed or fried vegetables. Vegetables in a cheese sauce. Regular canned vegetables. Regular canned tomato sauce and paste. Regular tomato and vegetable juices. Fruits Dried fruits. Canned fruit in light or heavy syrup. Fruit juice. Meat and Other Protein Products Fatty cuts of meat. Ribs, chicken wings, bacon, sausage, bologna, salami, chitterlings, fatback, hot dogs, bratwurst, and packaged luncheon meats. Salted nuts and seeds. Canned beans with salt. Dairy Whole or 2% milk, cream, half-and-half, and cream cheese. Whole-fat or sweetened yogurt. Full-fat cheeses or blue cheese. Nondairy creamers and whipped toppings. Processed cheese, cheese spreads, or cheese curds. Condiments Onion and garlic salt, seasoned salt, table salt, and sea salt. Canned and packaged gravies. Worcestershire sauce. Tartar sauce. Barbecue sauce. Teriyaki sauce. Soy sauce, including reduced sodium. Steak sauce. Fish sauce. Oyster sauce. Cocktail sauce. Horseradish. Ketchup and mustard. Meat flavorings and tenderizers. Bouillon cubes. Hot sauce. Tabasco sauce. Marinades. Taco seasonings. Relishes. Fats and Oils Butter, stick margarine, lard, shortening, ghee, and bacon fat. Coconut, palm kernel, or palm oils. Regular salad dressings. Other Pickles and olives. Salted popcorn and pretzels. The items listed above may not be a complete list of foods and beverages to avoid. Contact your dietitian for more information. WHERE CAN I FIND MORE INFORMATION? National Heart, Lung, and Blood Institute: travelstabloid.com Document Released: 09/20/2011 Document Revised: 02/15/2014 Document Reviewed: 08/05/2013 [facility] Patient Information 2015 [place], [place]. This information is not intended to replace advice given to you by your health care provider. Make  sure you discuss any questions you have with your health care provider. Iron Deficiency Anemia Anemia is a condition in which there are less red blood cells or hemoglobin in the blood than normal. Hemoglobin is the part of red blood cells that carries oxygen. Iron deficiency anemia is anemia caused by too little iron. It is the most common type of anemia. It may leave you tired and short of breath. CAUSES   Lack of iron in the diet.  Poor absorption of iron, as seen with intestinal disorders.  Intestinal bleeding.  Heavy periods. SIGNS AND SYMPTOMS  Mild anemia may not be noticeable. Symptoms may include:  Fatigue.  Headache.  Pale skin.  Weakness.  Tiredness.  Shortness of breath.  Dizziness.  Cold hands and feet.  Fast or irregular heartbeat. DIAGNOSIS  Diagnosis requires a thorough evaluation and physical exam by your health care provider. Blood tests are generally used to confirm iron deficiency anemia. Additional tests may be done to find the underlying cause of your anemia. These may include:  Testing for blood in the stool (fecal occult blood test).  A procedure to see inside the  colon and rectum (colonoscopy).  A procedure to see inside the esophagus and stomach (endoscopy). TREATMENT  Iron deficiency anemia is treated by correcting the cause of the deficiency. Treatment may involve:  Adding iron-rich foods to your diet.  Taking iron supplements. Pregnant or breastfeeding women need to take extra iron because their normal diet usually does not provide the required amount.  Taking vitamins. Vitamin C improves the absorption of iron. Your health care provider may recommend that you take your iron tablets with a glass of orange juice or vitamin C supplement.  Medicines to make heavy menstrual flow lighter.  Surgery. HOME CARE INSTRUCTIONS   Take iron as directed by your health care provider.  If you cannot tolerate taking iron supplements by mouth, talk to  your health care provider about taking them through a vein (intravenously) or an injection into a muscle.  For the best iron absorption, iron supplements should be taken on an empty stomach. If you cannot tolerate them on an empty stomach, you may need to take them with food.  Do not drink milk or take antacids at the same time as your iron supplements. Milk and antacids may interfere with the absorption of iron.  Iron supplements can cause constipation. Make sure to include fiber in your diet to prevent constipation. A stool softener may also be recommended.  Take vitamins as directed by your health care provider.  Eat a diet rich in iron. Foods high in iron include liver, lean beef, whole-grain bread, eggs, dried fruit, and dark green leafy vegetables. SEEK IMMEDIATE MEDICAL CARE IF:   You faint. If this happens, do not drive. Call your local emergency services (911 in [place] if no other help is available.  You have chest pain.  You feel nauseous or vomit.  You have severe or increased shortness of breath with activity.  You feel weak.  You have a rapid heartbeat.  You have unexplained sweating.  You become light-headed when getting up from a chair or bed. MAKE SURE YOU:   Understand these instructions.  Will watch your condition.  Will get help right away if you are not doing well or get worse. Document Released: 09/28/2000 Document Revised: 10/06/2013 Document Reviewed: 06/08/2013 [facility] Patient Information 2015 [place], [place]. This information is not intended to replace advice given to you by your health care provider. Make sure you discuss any questions you have with your health care provider. Place sinusitis patient instructions here.

## 2015-03-07 NOTE — Progress Notes (Signed)
   Subjective:    Patient ID: Janet Marquez, female    DOB: [birth year], 44 y.o.   MRN: [id number]  HPI 44 Year old female complains of cough and congestion for a few weeks. She has tried mucinex for 10 days but it is not helping. Ears are stuffy but throat is clear. Cough gets worse lying down at night. Thick mucus comes up when coughing.  Patient has a history of hypertension. She would like Atenolol changed because it is not helping. Has new edema, mild. No cp, sob, dizzy, syncope.  Recently had hysterectomy for heavy bleeding.   Review of Systems  Constitutional: Positive for fever, activity change and fatigue.  HENT: Positive for congestion, ear pain, facial swelling, rhinorrhea and sinus pressure.   Eyes: Negative.   Respiratory: Negative.   Cardiovascular: Negative.   Gastrointestinal: Negative.   Endocrine: Negative.   Genitourinary: Negative.   Musculoskeletal: Negative.   Skin: Negative.   Allergic/Immunologic: Negative.   Neurological: Positive for headaches. Negative for syncope.  Hematological: Negative.   Psychiatric/Behavioral: Positive for decreased concentration.       Objective:   Physical Exam  Constitutional: She is oriented to person, place, and time. She appears well-developed and well-nourished.  HENT:  Head: Normocephalic.  Right Ear: External ear normal.  Nose: Mucosal edema and rhinorrhea present. Epistaxis is observed. Right sinus exhibits maxillary sinus tenderness and frontal sinus tenderness. Left sinus exhibits maxillary sinus tenderness and frontal sinus tenderness.  Mouth/Throat: No uvula swelling. Posterior oropharyngeal edema present.  Cardiovascular: Normal rate, regular rhythm and normal heart sounds.   Pulmonary/Chest: Effort normal and breath sounds normal.  Abdominal: Soft.  Musculoskeletal: Normal range of motion.  Neurological: She is alert and oriented to person, place, and time. She exhibits normal muscle tone. Coordination  normal.  Psychiatric: She has a normal mood and affect. Her behavior is normal.   Results for orders placed or performed in visit on 03/07/15  POCT CBC  Result Value Ref Range   WBC 9.6 4.6 - 10.2 K/uL   Lymph, poc 2.1 0.6 - 3.4   POC LYMPH PERCENT 21.5 10 - 50 %L   MID (cbc) 0.5 0 - 0.9   POC MID % 4.8 0 - 12 %M   POC Granulocyte 7.1 (A) 2 - 6.9   Granulocyte percent 73.7 37 - 80 %G   RBC 4.67 4.04 - 5.48 M/uL   Hemoglobin 12.4 12.2 - 16.2 g/dL   HCT, POC 37.8 37.7 - 47.9 %   MCV 81.0 80 - 97 fL   MCH, POC 26.7 (A) 27 - 31.2 pg   MCHC 32.9 31.8 - 35.4 g/dL   RDW, POC 14.4 %   Platelet Count, POC 401 142 - 424 K/uL   MPV 6.6 0 - 99.8 fL          Assessment & Plan:  HTN not controlled/New edema/Med change/chlorthalidone Sinusiitis/HA

## 2015-03-28 ENCOUNTER — Encounter: Payer: Self-pay | Admitting: Family Medicine

## 2015-03-28 ENCOUNTER — Ambulatory Visit (INDEPENDENT_AMBULATORY_CARE_PROVIDER_SITE_OTHER): Payer: BLUE CROSS/BLUE SHIELD | Admitting: Family Medicine

## 2015-03-28 VITALS — BP 143/96 | HR 111 | Temp 98.5°F | Resp 16 | Ht 66.0 in | Wt 199.2 lb

## 2015-03-28 DIAGNOSIS — E669 Obesity, unspecified: Secondary | ICD-10-CM | POA: Diagnosis not present

## 2015-03-28 DIAGNOSIS — I10 Essential (primary) hypertension: Secondary | ICD-10-CM | POA: Diagnosis not present

## 2015-03-28 DIAGNOSIS — E1169 Type 2 diabetes mellitus with other specified complication: Secondary | ICD-10-CM

## 2015-03-28 DIAGNOSIS — E119 Type 2 diabetes mellitus without complications: Secondary | ICD-10-CM | POA: Diagnosis not present

## 2015-03-28 DIAGNOSIS — J0101 Acute recurrent maxillary sinusitis: Secondary | ICD-10-CM | POA: Diagnosis not present

## 2015-03-28 LAB — POCT GLYCOSYLATED HEMOGLOBIN (HGB A1C): HEMOGLOBIN A1C: 6.6

## 2015-03-28 LAB — GLUCOSE, POCT (MANUAL RESULT ENTRY): POC Glucose: 251 mg/dl — AB (ref 70–99)

## 2015-03-28 MED ORDER — AZITHROMYCIN 250 MG PO TABS: ORAL_TABLET | ORAL | Status: DC

## 2015-03-28 MED ORDER — LISINOPRIL 10 MG PO TABS: 10.0000 mg | ORAL_TABLET | Freq: Every day | ORAL | Status: DC

## 2015-03-28 MED ORDER — METFORMIN HCL 500 MG PO TABS: 500.0000 mg | ORAL_TABLET | Freq: Two times a day (BID) | ORAL | Status: DC

## 2015-03-28 NOTE — Progress Notes (Signed)
Subjective:    Patient ID: Janet Marquez, female    DOB: 02-21-1971, 44 y.o.   MRN: [id number]  HPI This is a very pleasant 44 yo female who presents today for follow up of HTN and continued cough and sinus symptoms. She completed a course of amoxicillin prescribed 03/07/15 but continues to have facial pressure and cough with "chunky, creamy" sputum.  She has a history of DM in the past and had normal blood sugar readings after diet and weight loss. She suspects her blood sugar may be elevated and requests that her HgbA1C be checked. She has also had difficulty with her thyroid and reports decreased energy and lack of motivation to exercise. She had lap hysterectomy with left salpingectomy 4/16 and feels that her recovery is going well.   Past Medical History  Diagnosis Date  . Hypertension   . Thyroid disease   . Hx of cardiovascular stress test     ETT-echo 3/16: Normal  . Anemia    Past Surgical History  Procedure Laterality Date  . Myomectomy      2002  . Cesarean section    . Laparoscopic assisted vaginal hysterectomy N/A 01/20/2015    Procedure: LAPAROSCOPIC ASSISTED VAGINAL HYSTERECTOMY;  Surgeon: Everlene Farrier, MD;  Location: [facility];  Service: Gynecology;  Laterality: N/A;  . Bilateral salpingectomy Left 01/20/2015    Procedure: LAPAROSCOPIC LEFT SALPINGECTOMY;  Surgeon: Everlene Farrier, MD;  Location: [facility];  Service: Gynecology;  Laterality: Left;   Family History  Problem Relation Age of Onset  . Hypertension Maternal Grandmother   . Heart attack Maternal Grandfather   . Stroke Maternal Grandmother    History  Substance Use Topics  . Smoking status: Never Smoker   . Smokeless tobacco: Never Used  . Alcohol Use: No     Comment: socially   Medications, allergies, past medical history, surgical history, family history, social history and problem list reviewed and updated.  Review of Systems No chest pain, no SOB, no wheeze, +cough, + sputum, no ear pain, no sore  throat, + facial pain/congestion, little nasal drainage, no watery/itcy eyes.    Objective:   Physical Exam Physical Exam  Constitutional: Oriented to person, place, and time. She appears well-developed and well-nourished.  HENT:  Head: Normocephalic and atraumatic.  Eyes: Conjunctivae are normal.  Neck: Normal range of motion. Neck supple.  Cardiovascular: Normal rate, regular rhythm and normal heart sounds.   Pulmonary/Chest: Effort normal and breath sounds normal.  Musculoskeletal: Normal range of motion.  Neurological: Alert and oriented to person, place, and time.  Skin: Skin is warm and dry.  Psychiatric: Normal mood and affect. Behavior is normal. Judgment and thought content normal.  Vitals reviewed. BP 143/96 mmHg  Pulse 111  Temp(Src) 98.5 F (36.9 C) (Oral)  Resp 16  Ht 5\' 6"  (1.676 m)  Wt 199 lb 3.2 oz (90.357 kg)  BMI 32.17 kg/m2  SpO2 99%  LMP 11/21/2014  Recheck blood pressure 128/88, heart rate 102.  Results for orders placed or performed in visit on 03/28/15  POCT glucose (manual entry)  Result Value Ref Range   POC Glucose 251 (A) 70 - 99 mg/dl  POCT glycosylated hemoglobin (Hb A1C)  Result Value Ref Range   Hemoglobin A1C 6.6    Diabetic Foot Exam - Simple   Simple Foot Form  Diabetic Foot exam was performed with the following findings:  Yes 03/28/2015  9:02 AM  Visual Inspection  Sensation Testing  Pulse Check  Comments  Pain in the left calf when walking and swelling in the left ankle         Assessment & Plan:  1. Essential hypertension - add lisinopril 10 mg po qd  2. Acute recurrent maxillary sinusitis - will provide atypical coverage - azithromycin (ZITHROMAX) 250 MG tablet; Take 2 tablets on first day, then one a day until finished  Dispense: 6 tablet; Refill: 0  3. Diabetes mellitus type 2 in obese - encouraged weight loss and increased activity - HM Diabetes Foot Exam - POCT glucose (manual entry) - POCT glycosylated hemoglobin  (Hb A1C) - metFORMIN (GLUCOPHAGE) 500 MG tablet; Take 1 tablet (500 mg total) by mouth 2 (two) times daily with a meal.  Dispense: 60 tablet; Refill: 3 - follow up in 2 months  Clarene Reamer, FNP-BC  [facility]  03/28/2015 1:09 PM

## 2015-07-10 ENCOUNTER — Ambulatory Visit (INDEPENDENT_AMBULATORY_CARE_PROVIDER_SITE_OTHER): Payer: BLUE CROSS/BLUE SHIELD | Admitting: Family Medicine

## 2015-07-10 ENCOUNTER — Ambulatory Visit (INDEPENDENT_AMBULATORY_CARE_PROVIDER_SITE_OTHER): Payer: BLUE CROSS/BLUE SHIELD

## 2015-07-10 VITALS — BP 118/80 | HR 84 | Temp 98.0°F | Resp 16 | Ht 69.0 in | Wt 186.8 lb

## 2015-07-10 DIAGNOSIS — M542 Cervicalgia: Secondary | ICD-10-CM

## 2015-07-10 DIAGNOSIS — M79601 Pain in right arm: Secondary | ICD-10-CM

## 2015-07-10 DIAGNOSIS — G5601 Carpal tunnel syndrome, right upper limb: Secondary | ICD-10-CM

## 2015-07-10 DIAGNOSIS — Z23 Encounter for immunization: Secondary | ICD-10-CM

## 2015-07-10 DIAGNOSIS — M5412 Radiculopathy, cervical region: Secondary | ICD-10-CM

## 2015-07-10 MED ORDER — CYCLOBENZAPRINE HCL 5 MG PO TABS: ORAL_TABLET | ORAL | Status: DC

## 2015-07-10 MED ORDER — MELOXICAM 7.5 MG PO TABS: 7.5000 mg | ORAL_TABLET | Freq: Every day | ORAL | Status: DC

## 2015-07-10 MED ORDER — PREDNISONE 20 MG PO TABS: ORAL_TABLET | ORAL | Status: DC

## 2015-07-10 NOTE — Patient Instructions (Addendum)
For carpal tunnel syndrome use a knee brace, avoid repetitive typing as possible outside of your typical work duties. See information below on set up of your desk at work. If your symptoms persist over the next 2 weeks, I recommend you follow-up with your orthopedic specialist. Let me know if a referral is needed  I suspect right arm symptoms are coming from your neck, and possible pinched nerve from the previous herniated disc. Try Flexeril up to every 8 hours, meloxicam once per day as needed, but if you're not improving in next 3-4 days with this treatment plan, start the printed prescription for prednisone. If you do start the prednisone, keep a close eye on your blood sugars. Also if you start the prednisone, stop meloxicam. Do not combine meloxicam (or any other over-the-counter anti-inflammatories) and prednisone. If your symptoms have not improved within the next week to 2 weeks, would recommend evaluation by an orthopedic specialist.  Carpal Tunnel Syndrome The carpal tunnel is a narrow area located on the palm side of your wrist. The tunnel is formed by the wrist bones and ligaments. Nerves, blood vessels, and tendons pass through the carpal tunnel. Repeated wrist motion or certain diseases may cause swelling within the tunnel. This swelling pinches the main nerve in the wrist (median nerve) and causes the painful hand and arm condition called carpal tunnel syndrome. CAUSES   Repeated wrist motions.  Wrist injuries.  Certain diseases like arthritis, diabetes, alcoholism, hyperthyroidism, and kidney failure.  Obesity.  Pregnancy. SYMPTOMS   A "pins and needles" feeling in your fingers or hand, especially in your thumb, index and middle fingers.  Tingling or numbness in your fingers or hand.  An aching feeling in your entire arm, especially when your wrist and elbow are bent for long periods of time.  Wrist pain that goes up your arm to your shoulder.  Pain that goes down into your  palm or fingers.  A weak feeling in your hands. DIAGNOSIS  Your health care provider will take your history and perform a physical exam. An electromyography test may be needed. This test measures electrical signals sent out by your nerves into the muscles. The electrical signals are usually slowed by carpal tunnel syndrome. You may also need X-rays. TREATMENT  Carpal tunnel syndrome may clear up by itself. Your health care provider may recommend a wrist splint or medicine such as a nonsteroidal anti-inflammatory medicine. Cortisone injections may help. Sometimes, surgery may be needed to free the pinched nerve.  HOME CARE INSTRUCTIONS   Take all medicine as directed by your health care provider. Only take over-the-counter or prescription medicines for pain, discomfort, or fever as directed by your health care provider.  If you were given a splint to keep your wrist from bending, wear it as directed. It is important to wear the splint at night. Wear the splint for as long as you have pain or numbness in your hand, arm, or wrist. This may take 1 to 2 months.  Rest your wrist from any activity that may be causing your pain. If your symptoms are work-related, you may need to talk to your employer about changing to a job that does not require using your wrist.  Put ice on your wrist after long periods of wrist activity.  Put ice in a plastic bag.  Place a towel between your skin and the bag.  Leave the ice on for 15-20 minutes, 03-04 times a day.  Keep all follow-up visits as directed by  your health care provider. This includes any orthopedic referrals, physical therapy, and rehabilitation. Any delay in getting necessary care could result in a delay or failure of your condition to heal. SEEK IMMEDIATE MEDICAL CARE IF:   You have new, unexplained symptoms.  Your symptoms get worse and are not helped or controlled with medicines. MAKE SURE YOU:   Understand these instructions.  Will watch  your condition.  Will get help right away if you are not doing well or get worse. Document Released: 09/28/2000 Document Revised: 02/15/2014 Document Reviewed: 08/17/2011 [facility] Patient Information 2015 [place], [place]. This information is not intended to replace advice given to you by your health care provider. Make sure you discuss any questions you have with your health care provider.    Cervical Radiculopathy Cervical radiculopathy happens when a nerve in the neck is pinched or bruised by a slipped (herniated) disk or by arthritic changes in the bones of the cervical spine. This can occur due to an injury or as part of the normal aging process. Pressure on the cervical nerves can cause pain or numbness that runs from your neck all the way down into your arm and fingers. CAUSES  There are many possible causes, including:  Injury.  Muscle tightness in the neck from overuse.  Swollen, painful joints (arthritis).  Breakdown or degeneration in the bones and joints of the spine (spondylosis) due to aging.  Bone spurs that may develop near the cervical nerves. SYMPTOMS  Symptoms include pain, weakness, or numbness in the affected arm and hand. Pain can be severe or irritating. Symptoms may be worse when extending or turning the neck. DIAGNOSIS  Your caregiver will ask about your symptoms and do a physical exam. He or she may test your strength and reflexes. X-rays, CT scans, and MRI scans may be needed in cases of injury or if the symptoms do not go away after a period of time. Electromyography (EMG) or nerve conduction testing may be done to study how your nerves and muscles are working. TREATMENT  Your caregiver may recommend certain exercises to help relieve your symptoms. Cervical radiculopathy can, and often does, get better with time and treatment. If your problems continue, treatment options may include:  Wearing a soft collar for short periods of time.  Physical therapy to  strengthen the neck muscles.  Medicines, such as nonsteroidal anti-inflammatory drugs (NSAIDs), oral corticosteroids, or spinal injections.  Surgery. Different types of surgery may be done depending on the cause of your problems. HOME CARE INSTRUCTIONS   Put ice on the affected area.  Put ice in a plastic bag.  Place a towel between your skin and the bag.  Leave the ice on for 15-20 minutes, 03-04 times a day or as directed by your caregiver.  If ice does not help, you can try using heat. Take a warm shower or bath, or use a hot water bottle as directed by your caregiver.  You may try a gentle neck and shoulder massage.  Use a flat pillow when you sleep.  Only take over-the-counter or prescription medicines for pain, discomfort, or fever as directed by your caregiver.  If physical therapy was prescribed, follow your caregiver's directions.  If a soft collar was prescribed, use it as directed. SEEK IMMEDIATE MEDICAL CARE IF:   Your pain gets much worse and cannot be controlled with medicines.  You have weakness or numbness in your hand, arm, face, or leg.  You have a high fever or a stiff, rigid  neck.  You lose bowel or bladder control (incontinence).  You have trouble with walking, balance, or speaking. MAKE SURE YOU:   Understand these instructions.  Will watch your condition.  Will get help right away if you are not doing well or get worse. Document Released: 06/26/2001 Document Revised: 12/24/2011 Document Reviewed: 05/15/2011 [facility] Patient Information 2015 [place], [place]. This information is not intended to replace advice given to you by your health care provider. Make sure you discuss any questions you have with your health care provider.  Back/Neck: How to set up your workstation Chair:  An adjustable chair to fit your height with a slight downward tilt of chair seat is helpful.  Should have good high-back support that assists with keeping the natural curves  of your spine.  A "lumbar roll" or pillow at your low back can help you keep good posture.  Adjustable arm rests may decrease strain in upper body.   Keyboard:  Should be close and at elbow or just below elbow height.  Split keyboards can help decrease strain on wrist.  Wrist supports can provide mini-breaks to your wrists throughout the day.  Monitor/Screen:  Top of screen should be at eye level or slightly lower.  Good lighting is important to prevent eye strain, or headaches from glare.  Head Posture: A copy holder on either side of the monitor will help limit neck strain.  Head should not be forward.  Ears should line up with you shoulders.  Leg Posture:  Knee and hips should be bent half-way (about a 90 degree angle).  Shorter people may need something to prop their feet on, with knees bent, like a phone book.  Other Stuff: Keep the stuff (phones, pens, phonebooks, etc) that you use frequently close to you, so you're not straining to reach them.

## 2015-07-10 NOTE — Progress Notes (Signed)
Subjective:  This chart was scribed for Merri Ray, MD by Moises Blood, Medical Scribe. This patient was seen in room 4 and the patient's care was started 10:02 AM.    Patient ID: Janet Marquez, female    DOB: 1970-11-26, 44 y.o.   MRN: [id number]  HPI Janet Marquez is a 44 y.o. female Pt is here for right neck, shoulder, and arm pain that started 3 weeks ago.  H/o DM type 2 mostly controlled with most recent A1C 6.6 in June  She's tried heat pads, sticky pads, applying ice, and OTC creams. She's also tried tylenol but with no relief. The pain is getting worse, but she's able to deal with it. She has some tingling in the hand. She denies any recent falls, twists or turns.   She's been diagnosed with carpal tunnel in her wrist 2 years ago. It has started to bother her again starting about 3 weeks ago. She has a brace for it.  She had bulging disk in neck and was treated with prednisone.    She also received a flu shot today.   She was previously in [employer], but left after getting hurt.  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   Patient Active Problem List   Diagnosis Date Noted  . Fibroids 01/20/2015  . Sinus tachycardia 11/23/2014  . Chest pain 11/22/2014  . Hypothyroidism 12/14/2009  . DM2 (diabetes mellitus, type 2) 12/14/2009  . Anemia 12/14/2009  . Essential hypertension 12/14/2009  . GERD 12/14/2009   Past Medical History  Diagnosis Date  . Hypertension   . Thyroid disease   . Hx of cardiovascular stress test     ETT-echo 3/16: Normal  . Anemia    Past Surgical History  Procedure Laterality Date  . Myomectomy      2002  . Cesarean section    . Laparoscopic assisted vaginal hysterectomy N/A 01/20/2015    Procedure: LAPAROSCOPIC ASSISTED VAGINAL HYSTERECTOMY;  Surgeon: Everlene Farrier, MD;  Location: [facility];  Service: Gynecology;  Laterality: N/A;  . Bilateral salpingectomy Left 01/20/2015    Procedure: LAPAROSCOPIC LEFT  SALPINGECTOMY;  Surgeon: Everlene Farrier, MD;  Location: [facility];  Service: Gynecology;  Laterality: Left;   Allergies  Allergen Reactions  . Clarithromycin Nausea And Vomiting   Prior to Admission medications   Medication Sig Start Date End Date Taking? Authorizing Provider  carboxymethylcellulose 1 % ophthalmic solution Place 1 drop into both eyes 3 (three) times daily.    Yes Historical Provider, MD  chlorthalidone (HYGROTON) 25 MG tablet Take 1 tablet (25 mg total) by mouth daily. 03/07/15  Yes Orma Flaming, MD  Ferrous Fumarate 324 MG TABS Take 1 tablet (106 mg of iron total) by mouth 3 (three) times daily. 11/23/14  Yes Barton Dubois, MD  lisinopril (PRINIVIL,ZESTRIL) 10 MG tablet Take 1 tablet (10 mg total) by mouth daily. 03/28/15  Yes Elby Beck, FNP  metFORMIN (GLUCOPHAGE) 500 MG tablet Take 1 tablet (500 mg total) by mouth 2 (two) times daily with a meal. 03/28/15  Yes Elby Beck, FNP   Social History   Social History  . Marital Status: Single    Spouse Name: N/A  . Number of Children: N/A  . Years of Education: N/A   Occupational History  . Not on file.   Social History Main Topics  . Smoking status: Never Smoker   . Smokeless tobacco: Never Used  . Alcohol Use: No  Comment: socially  . Drug Use: No  . Sexual Activity: Not on file   Other Topics Concern  . Not on file   Social History Narrative    Review of Systems  Musculoskeletal: Positive for arthralgias (right neck, shoulder, arm) and neck pain (right).  Skin: Negative for rash and wound.       Objective:   Physical Exam  Constitutional: She is oriented to person, place, and time. She appears well-developed and well-nourished. No distress.  HENT:  Head: Normocephalic and atraumatic.  Eyes: EOM are normal. Pupils are equal, round, and reactive to light.  Neck: Neck supple.  Cardiovascular: Normal rate.   Pulmonary/Chest: Effort normal. No respiratory distress.  Musculoskeletal: Normal  range of motion.  full ROM of neck with some reproduction of pain in neck. slight discomfort and tenderness on right lower cervical spine with spasm equal grip strength in upper extremities bilaterally positive right tinel and phalen  Neurological: She is alert and oriented to person, place, and time.  Reflex Scores:      Tricep reflexes are 2+ on the right side and 2+ on the left side.      Bicep reflexes are 2+ on the right side and 2+ on the left side.      Brachioradialis reflexes are 2+ on the right side and 2+ on the left side. Skin: Skin is warm and dry.  Psychiatric: She has a normal mood and affect. Her behavior is normal.  Nursing note and vitals reviewed.   Filed Vitals:   07/10/15 0909  BP: 118/80  Pulse: 84  Temp: 98 F (36.7 C)  TempSrc: Oral  Resp: 16  Height: 5\' 9"  (1.753 m)  Weight: 186 lb 12.8 oz (84.732 kg)  SpO2: 98%   [facility] reading (PRIMARY) by Dr. Carlota Raspberry: c-spine loss of lordosis minimal degenerative changes     Assessment & Plan:   Janet Marquez is a 44 y.o. female Neck pain on right side - Plan: DG Cervical Spine 2 or 3 views, cyclobenzaprine (FLEXERIL) 5 MG tablet, meloxicam (MOBIC) 7.5 MG tablet, predniSONE (DELTASONE) 20 MG tablet Right arm pain - Plan: DG Cervical Spine 2 or 3 views, cyclobenzaprine (FLEXERIL) 5 MG tablet, meloxicam (MOBIC) 7.5 MG tablet, predniSONE (DELTASONE) 20 MG tablet Cervical radiculopathy - Plan: predniSONE (DELTASONE) 20 MG tablet  -Suspected right-sided cervical radiculopathy, with history of herniated disc. Strength and reflexes are intact, can try initial treatment with Flexeril for spasm, and meloxicam for inflammation. If this is not improved in a few days, did prescribe prednisone taper to start in place of meloxicam. Advised to not combine NSAIDs and prednisone.  Side effects discussed of prednisone. RTC precautions, but may need orthopedic eval if symptoms persist.  Right carpal tunnel syndrome  -Intermittent by  history. No weakness or appreciable atrophy on exam. Changed out her spica splint for a standard wrist splint to allow greater movement of thumb, handout given on treatment as well as setting up her workstation for improved biomechanics.  If she needs a letter for her employer stating she does have symptoms of carpal tunnel to change her mouse or other office equipment adjustments, can let me know.   Need for prophylactic vaccination and inoculation against influenza - Plan: Flu Vaccine QUAD 36+ mos IM given   Meds ordered this encounter  Medications  . cyclobenzaprine (FLEXERIL) 5 MG tablet    Sig: 1 pill by mouth up to every 8 hours as needed. Start with one pill by mouth each  bedtime as needed due to sedation    Dispense:  15 tablet    Refill:  0  . meloxicam (MOBIC) 7.5 MG tablet    Sig: Take 1 tablet (7.5 mg total) by mouth daily.    Dispense:  30 tablet    Refill:  0  . predniSONE (DELTASONE) 20 MG tablet    Sig: 3 by mouth for 3 days, then 2 by mouth for 2 days, then 1 by mouth for 2 days, then 1/2 by mouth for 2 days.    Dispense:  16 tablet    Refill:  0   Patient Instructions  For carpal tunnel syndrome use a knee brace, avoid repetitive typing as possible outside of your typical work duties. See information below on set up of your desk at work. If your symptoms persist over the next 2 weeks, I recommend you follow-up with your orthopedic specialist. Let me know if a referral is needed  I suspect right arm symptoms are coming from your neck, and possible pinched nerve from the previous herniated disc. Try Flexeril up to every 8 hours, meloxicam once per day as needed, but if you're not improving in next 3-4 days with this treatment plan, start the printed prescription for prednisone. If you do start the prednisone, keep a close eye on your blood sugars. Also if you start the prednisone, stop meloxicam. Do not combine meloxicam (or any other over-the-counter anti-inflammatories) and  prednisone. If your symptoms have not improved within the next week to 2 weeks, would recommend evaluation by an orthopedic specialist.  Carpal Tunnel Syndrome The carpal tunnel is a narrow area located on the palm side of your wrist. The tunnel is formed by the wrist bones and ligaments. Nerves, blood vessels, and tendons pass through the carpal tunnel. Repeated wrist motion or certain diseases may cause swelling within the tunnel. This swelling pinches the main nerve in the wrist (median nerve) and causes the painful hand and arm condition called carpal tunnel syndrome. CAUSES   Repeated wrist motions.  Wrist injuries.  Certain diseases like arthritis, diabetes, alcoholism, hyperthyroidism, and kidney failure.  Obesity.  Pregnancy. SYMPTOMS   A "pins and needles" feeling in your fingers or hand, especially in your thumb, index and middle fingers.  Tingling or numbness in your fingers or hand.  An aching feeling in your entire arm, especially when your wrist and elbow are bent for long periods of time.  Wrist pain that goes up your arm to your shoulder.  Pain that goes down into your palm or fingers.  A weak feeling in your hands. DIAGNOSIS  Your health care provider will take your history and perform a physical exam. An electromyography test may be needed. This test measures electrical signals sent out by your nerves into the muscles. The electrical signals are usually slowed by carpal tunnel syndrome. You may also need X-rays. TREATMENT  Carpal tunnel syndrome may clear up by itself. Your health care provider may recommend a wrist splint or medicine such as a nonsteroidal anti-inflammatory medicine. Cortisone injections may help. Sometimes, surgery may be needed to free the pinched nerve.  HOME CARE INSTRUCTIONS   Take all medicine as directed by your health care provider. Only take over-the-counter or prescription medicines for pain, discomfort, or fever as directed by your  health care provider.  If you were given a splint to keep your wrist from bending, wear it as directed. It is important to wear the splint at night. Wear the splint  for as long as you have pain or numbness in your hand, arm, or wrist. This may take 1 to 2 months.  Rest your wrist from any activity that may be causing your pain. If your symptoms are work-related, you may need to talk to your employer about changing to a job that does not require using your wrist.  Put ice on your wrist after long periods of wrist activity.  Put ice in a plastic bag.  Place a towel between your skin and the bag.  Leave the ice on for 15-20 minutes, 03-04 times a day.  Keep all follow-up visits as directed by your health care provider. This includes any orthopedic referrals, physical therapy, and rehabilitation. Any delay in getting necessary care could result in a delay or failure of your condition to heal. SEEK IMMEDIATE MEDICAL CARE IF:   You have new, unexplained symptoms.  Your symptoms get worse and are not helped or controlled with medicines. MAKE SURE YOU:   Understand these instructions.  Will watch your condition.  Will get help right away if you are not doing well or get worse. Document Released: 09/28/2000 Document Revised: 02/15/2014 Document Reviewed: 08/17/2011 [facility] Patient Information 2015 [place], [place]. This information is not intended to replace advice given to you by your health care provider. Make sure you discuss any questions you have with your health care provider.    Cervical Radiculopathy Cervical radiculopathy happens when a nerve in the neck is pinched or bruised by a slipped (herniated) disk or by arthritic changes in the bones of the cervical spine. This can occur due to an injury or as part of the normal aging process. Pressure on the cervical nerves can cause pain or numbness that runs from your neck all the way down into your arm and fingers. CAUSES  There are  many possible causes, including:  Injury.  Muscle tightness in the neck from overuse.  Swollen, painful joints (arthritis).  Breakdown or degeneration in the bones and joints of the spine (spondylosis) due to aging.  Bone spurs that may develop near the cervical nerves. SYMPTOMS  Symptoms include pain, weakness, or numbness in the affected arm and hand. Pain can be severe or irritating. Symptoms may be worse when extending or turning the neck. DIAGNOSIS  Your caregiver will ask about your symptoms and do a physical exam. He or she may test your strength and reflexes. X-rays, CT scans, and MRI scans may be needed in cases of injury or if the symptoms do not go away after a period of time. Electromyography (EMG) or nerve conduction testing may be done to study how your nerves and muscles are working. TREATMENT  Your caregiver may recommend certain exercises to help relieve your symptoms. Cervical radiculopathy can, and often does, get better with time and treatment. If your problems continue, treatment options may include:  Wearing a soft collar for short periods of time.  Physical therapy to strengthen the neck muscles.  Medicines, such as nonsteroidal anti-inflammatory drugs (NSAIDs), oral corticosteroids, or spinal injections.  Surgery. Different types of surgery may be done depending on the cause of your problems. HOME CARE INSTRUCTIONS   Put ice on the affected area.  Put ice in a plastic bag.  Place a towel between your skin and the bag.  Leave the ice on for 15-20 minutes, 03-04 times a day or as directed by your caregiver.  If ice does not help, you can try using heat. Take a warm shower or bath,  or use a hot water bottle as directed by your caregiver.  You may try a gentle neck and shoulder massage.  Use a flat pillow when you sleep.  Only take over-the-counter or prescription medicines for pain, discomfort, or fever as directed by your caregiver.  If physical  therapy was prescribed, follow your caregiver's directions.  If a soft collar was prescribed, use it as directed. SEEK IMMEDIATE MEDICAL CARE IF:   Your pain gets much worse and cannot be controlled with medicines.  You have weakness or numbness in your hand, arm, face, or leg.  You have a high fever or a stiff, rigid neck.  You lose bowel or bladder control (incontinence).  You have trouble with walking, balance, or speaking. MAKE SURE YOU:   Understand these instructions.  Will watch your condition.  Will get help right away if you are not doing well or get worse. Document Released: 06/26/2001 Document Revised: 12/24/2011 Document Reviewed: 05/15/2011 [facility] Patient Information 2015 [place], [place]. This information is not intended to replace advice given to you by your health care provider. Make sure you discuss any questions you have with your health care provider.  Back/Neck: How to set up your workstation Chair:  An adjustable chair to fit your height with a slight downward tilt of chair seat is helpful.  Should have good high-back support that assists with keeping the natural curves of your spine.  A "lumbar roll" or pillow at your low back can help you keep good posture.  Adjustable arm rests may decrease strain in upper body.   Keyboard:  Should be close and at elbow or just below elbow height.  Split keyboards can help decrease strain on wrist.  Wrist supports can provide mini-breaks to your wrists throughout the day.  Monitor/Screen:  Top of screen should be at eye level or slightly lower.  Good lighting is important to prevent eye strain, or headaches from glare.  Head Posture: A copy holder on either side of the monitor will help limit neck strain.  Head should not be forward.  Ears should line up with you shoulders.  Leg Posture:  Knee and hips should be bent half-way (about a 90 degree angle).  Shorter people may need something to prop their feet on, with knees  bent, like a phone book.  Other Stuff: Keep the stuff (phones, pens, phonebooks, etc) that you use frequently close to you, so you're not straining to reach them.          By signing my name below, I, Moises Blood, attest that this documentation has been prepared under the direction and in the presence of Merri Ray, MD. Electronically Signed: Moises Blood, [place]. 07/10/2015 , 10:02 AM . I personally performed the services described in this documentation, which was scribed in my presence. The recorded information has been reviewed and considered, and addended by me as needed.

## 2015-07-29 ENCOUNTER — Encounter (HOSPITAL_COMMUNITY): Payer: Self-pay | Admitting: *Deleted

## 2015-07-29 ENCOUNTER — Emergency Department (HOSPITAL_COMMUNITY)
Admission: EM | Admit: 2015-07-29 | Discharge: 2015-07-29 | Disposition: A | Discharge status: Home / Self Care | Payer: BLUE CROSS/BLUE SHIELD | Attending: Emergency Medicine | Admitting: Emergency Medicine

## 2015-07-29 DIAGNOSIS — M5412 Radiculopathy, cervical region: Secondary | ICD-10-CM

## 2015-07-29 DIAGNOSIS — Z791 Long term (current) use of non-steroidal anti-inflammatories (NSAID): Secondary | ICD-10-CM | POA: Insufficient documentation

## 2015-07-29 DIAGNOSIS — M542 Cervicalgia: Secondary | ICD-10-CM | POA: Insufficient documentation

## 2015-07-29 DIAGNOSIS — G5601 Carpal tunnel syndrome, right upper limb: Secondary | ICD-10-CM | POA: Insufficient documentation

## 2015-07-29 DIAGNOSIS — D649 Anemia, unspecified: Secondary | ICD-10-CM | POA: Insufficient documentation

## 2015-07-29 DIAGNOSIS — Z8639 Personal history of other endocrine, nutritional and metabolic disease: Secondary | ICD-10-CM | POA: Insufficient documentation

## 2015-07-29 DIAGNOSIS — I10 Essential (primary) hypertension: Secondary | ICD-10-CM | POA: Insufficient documentation

## 2015-07-29 DIAGNOSIS — Z79899 Other long term (current) drug therapy: Secondary | ICD-10-CM | POA: Insufficient documentation

## 2015-07-29 MED ORDER — DICLOFENAC POTASSIUM 50 MG PO TABS: 50.0000 mg | ORAL_TABLET | Freq: Three times a day (TID) | ORAL | Status: DC

## 2015-07-29 MED ORDER — CYCLOBENZAPRINE HCL 10 MG PO TABS: 10.0000 mg | ORAL_TABLET | Freq: Two times a day (BID) | ORAL | Status: DC | PRN

## 2015-07-29 NOTE — Discharge Instructions (Signed)
-  Use the resource guide to find affordable PCP options and follow up with them - Use diclofenac (anti-inflammatory) and flexeril (muscle relaxer) for pain control - Use ice or heat for topical pain control   Emergency Department Resource Guide 1) Find a Doctor and Pay Out of Pocket Although you won't have to find out who is covered by your insurance plan, it is a good idea to ask around and get recommendations. You will then need to call the office and see if the doctor you have chosen will accept you as a new patient and what types of options they offer for patients who are self-pay. Some doctors offer discounts or will set up payment plans for their patients who do not have insurance, but you will need to ask so you aren't surprised when you get to your appointment.  2) Contact Your Local Health Department Not all health departments have doctors that can see patients for sick visits, but many do, so it is worth a call to see if yours does. If you don't know where your local health department is, you can check in your phone book. The CDC also has a tool to help you locate your state's health department, and many state websites also have listings of all of their local health departments.  3) Find a [facility] If your illness is not likely to be very severe or complicated, you may want to try a walk in clinic. These are popping up all over the country in pharmacies, drugstores, and shopping centers. They're usually staffed by nurse practitioners or physician assistants that have been trained to treat common illnesses and complaints. They're usually fairly quick and inexpensive. However, if you have serious medical issues or chronic medical problems, these are probably not your best option.  No Primary Care Doctor: - Call Health Connect at  [phone] - they can help you locate a primary care doctor that  accepts your insurance, provides certain services, etc. - Physician Referral Service-  [phone]  Chronic Pain Problems: Organization         Address  Phone   Notes  [facility]  [phone] Patients need to be referred by their primary care doctor.   Medication Assistance: Organization         Address  Phone   Notes  [facility] Medication [facility] [address] [place], [place] [address] [phone] --Must be a resident of [facility] -- Must have NO insurance coverage whatsoever (no Medicaid/ Medicare, etc.) -- The pt. MUST have a primary care doctor that directs their care regularly and follows them in the community   Med[facility]ist  [phone]   [employer]  [phone]    Agencies that provide inexpensive medical care: Organization         Address  Phone   Notes  [place]  [phone]   [facility]    [phone]   [facility] [place], [place] [address] [phone]   [place] [address], [place] [phone]   Planned Parenthood    [phone]   [facility]    [phone]   [place] and [place] [address],  Phone:  [phone], Fax:  [phone] Hours of Operation:  9 am - 6 pm, M-F.  Also accepts Medicaid/Medicare and self-pay.  [facility] for [place] [address], [address], [place] Phone: [phone], Fax: [phone]. Hours of Operation:  8:30 am - 5:30 pm, M-F.  Also accepts Medicaid and self-pay.  [facility] [place] [address] [place] Phone: [phone]   [place], [place], [place] [phone], Ext. [phone] Mondays & Thursdays: 7-9 AM.  First 15 patients are seen on a first come, first serve basis.    [place] Providers:  Organization         Address  Phone   Notes  [facility] [address],  [place] [phone] Also accepts self-pay patients.  [facility] [id number] [place], [place]  [phone]   [place], [address], [place] [phone]   [facility] [address] [place] [phone]   Lucianne Lei [address], [place]   [phone] Only accepts [place] Access [place] patients after they have their name applied to their card.   Self-Pay (no insurance) in [facility]:  Organization         Address  Phone   Notes  Sickle Cell Patients, [facility] [place] [phone]   [facility] Urgent Care [place] [phone]   [facility] [place], [place], [place] [phone]   [facility]/Dr. Osei-Bonsu  [address], [place] or [address], [place] [phone] Phone number for both [place] and [place] locations is the same.  [facility] [address] [place] [phone]   [facility] [address], [place] or [address] [phone]   [facility] [address], [place] [phone], phone; [phone], fax Sees patients 1st and 3rd Saturday of every month.  Must not qualify for public or private insurance (i.e. Medicaid, Medicare, [place] Health Choice, Veterans' Benefits)  Household income should be no more than 200% of the poverty level The clinic cannot treat you if you are pregnant or think you are pregnant  Sexually transmitted diseases are not treated at the clinic.    Dental Care: Organization         Address  Phone  Notes  [facility] Department of [facility] [place] [phone] Accepts children up to age 102 who are enrolled in  [place] or [place]; pregnant women with a Medicaid card; and children who have applied for Medicaid or [place] Health Choice, but were declined, whose parents can pay a reduced fee at time of service.  [facility] Department of [facility]  [address], [place] [phone] Accepts children up to age 74 who are enrolled in [place] or [place]; pregnant women with a Medicaid card; and children who have applied for Medicaid or [place] Health Choice, but were declined, whose parents can pay a reduced fee at time of service.  [facility]  [place] [phone] Patients are seen by appointment only. Walk-ins are not accepted. [place] will see patients 40 years of age and older. Monday - Tuesday (8am-5pm) Most Wednesdays (8:30-5pm) $30 per visit, cash  only  [facility] Adult [facility] Access PROGRAM  [address], [facility] [phone] Patients are seen by appointment only. Walk-ins are not accepted. [place] will see patients 14 years of age and older. One Wednesday Evening (Monthly: Volunteer Based).  $30 per visit, cash only  [place]  [phone] for adults; Children under age 48, call Graduate Pediatric Dentistry at [phone]. Children aged 39-14, please call [phone] to request a pediatric application.  Dental services are provided in all areas of dental care including fillings, crowns and bridges, complete and partial dentures, implants, gum treatment, root canals, and extractions. Preventive care is also provided. Treatment is provided to both adults and children. Patients are selected via a lottery and there is often a waiting list.   [facility] [address] [place]  [phone] [email or web address]   [facility] [address] [place], [place] [phone], Ext. [phone] Second and Fourth Thursday of each month, opens at 6:30  AM; Clinic ends at 9 AM.  Patients are seen on a first-come first-served basis, and a limited number are seen during each clinic.   [facility]  [address] Hillard Danker [place], [place] [phone]   Eligibility Requirements You must have lived in [place], [place], or [place] counties for at least the last three months.   You cannot be eligible for state or federal sponsored [employer], including [employer], [place], or [employer].   You generally cannot be eligible for healthcare insurance through your employer.    How to apply: Eligibility screenings are held every Tuesday and Wednesday afternoon from 1:00 pm until 4:00 pm. You do not need an appointment for the interview!  [facility] [address], [place], [place]   [place]  [place] Department  [place]  [phone]    Behavioral Health Resources in the Community: Intensive Outpatient Programs Organization         Address  Phone  Notes  [place]. [address], [place], [place] [phone]   [facility] Outpatient [address] [place], [place]   ADS: Alcohol & Drug Svcs [address], [place], [place]   [place] [address],  [place], [place] or [phone]   Substance Abuse Resources Organization         Address  Phone  Notes  Alcohol and Drug Services  [phone]   [place]  [phone]   The [place]   Chinita Pester  [phone]   Residential & Outpatient Substance Abuse Program  [phone]   Psychological Services Organization         Address  Phone  Notes  [facility] [place]  [place]  [phone]   [place] [address], [place] or  [phone]    Mobile Crisis Teams Organization         Address  Phone  Notes  [facility], Mobile Crisis Care Unit  [phone]   [facility]  [address] [place], [place]   [place] Levels [address] [place] [phone]    Self-Help/Support Groups Organization         Address  Phone  Notes  [facility]ental Health Assoc. of [place] - variety of support groups  [place] Call for more information  Narcotics Anonymous (NA), [facility] [address], [employer] [place]  2 meetings at this location   Special educational needs teacher         Address  Phone  Notes  [facility] [place],    [place]  [phone]   [facility]  [address] [place] [phone], [place], [place]   [place], [place] [phone] Admissions: 8am-3pm M-F  Incentives Substance [place] [address]    [place], [place] [phone]   [facility] [address] [place], [place], [place]   [facility] [address],  [place], [place]   [facility] Programs - Intensive Outpatient [address] Kristeen [address], [place], [place]   [facility] ([place].,  [place], [place] [phone] or [phone]   [facility] (RTS) [address] [place], [place] Accepts Medicaid  [facility] [place] [address],  [place] [phone] Substance Abuse/Addiction Treatment   [facility] Organization         Address  Phone  Notes  [facility]  [phone]   Domenic Schwab, PhD [address] Arlis Port[place], [place]   [phone] or [phone]   [place], [place] [phone]   [facility] [address]  [place], [place] [phone] Insurance/Medicaid/sponsorship through [facility] Families [address], [address] [place]                                    [place], [place] [phone] [place] [address], [place] [phone]    Dr. Adele Schilder  [phone]   [facility] Dept. 1) [address], [place] 2) [place] 3)  [place] [address], [place] [phone]  [phone]   [place] [phone] or [phone] (After Hours)       Carpal Tunnel Syndrome Carpal tunnel syndrome is a condition that causes pain in your hand and arm. The carpal tunnel is a narrow area located on the palm side of your wrist. Repeated wrist motion or certain diseases may cause swelling within the tunnel. This swelling pinches the main nerve in the wrist (median nerve). CAUSES  This condition may be caused by:   Repeated wrist motions.  Wrist injuries.  Arthritis.  A cyst or tumor in the carpal tunnel.  Fluid buildup during pregnancy. Sometimes the cause of this condition is not known.  RISK FACTORS This condition is more likely to develop in:   People who have jobs that cause them to repeatedly move their wrists in the same motion, such as butchers and cashiers.  Women.  People with certain conditions, such as:  Diabetes.  Obesity.  An underactive thyroid (hypothyroidism).  Kidney failure. SYMPTOMS  Symptoms of this condition include:   A tingling feeling in your fingers, especially in your thumb, index, and middle fingers.  Tingling or numbness in your hand.  An aching feeling in your entire arm, especially when your wrist  and elbow are bent for long periods of time.  Wrist pain that goes up your arm to your shoulder.  Pain that goes down into your palm or fingers.  A weak feeling in your hands. You may have  trouble grabbing and holding items. Your symptoms may feel worse during the night.  DIAGNOSIS  This condition is diagnosed with a medical history and physical exam. You may also have tests, including:   An electromyogram (EMG). This test measures electrical signals sent by your nerves into the muscles.  X-rays. TREATMENT  Treatment for this condition includes:  Lifestyle changes. It is important to stop doing or modify the activity that caused your condition.  Physical or occupational therapy.  Medicines for pain and inflammation. This may include medicine that is injected into your wrist.  A wrist splint.  Surgery. HOME CARE INSTRUCTIONS  If You Have a Splint:  Wear it as told by your health care provider. Remove it only as told by your health care provider.  Loosen the splint if your fingers become numb and tingle, or if they turn cold and blue.  Keep the splint clean and dry. General Instructions  Take over-the-counter and prescription medicines only as told by your health care provider.  Rest your wrist from any activity that may be causing your pain. If your condition is work related, talk to your employer about changes that can be made, such as getting a wrist pad to use while typing.  If directed, apply ice to the painful area:  Put ice in a plastic bag.  Place a towel between your skin and the bag.  Leave the ice on for 20 minutes, 2-3 times per day.  Keep all follow-up visits as told by your health care provider. This is important.  Do any exercises as told by your health care provider, physical therapist, or occupational therapist. [place] IF:   You have new symptoms.  Your pain is not controlled with medicines.  Your symptoms get worse.   This information is not intended to replace advice given to you by your health care provider. Make sure you discuss any questions you have with your health care provider.   Document Released: 09/28/2000  Document Revised: 06/22/2015 Document Reviewed: 02/16/2015 Elsevier Interactive Patient Education 2016 Elsevier Inc.  Cervical Radiculopathy Cervical radiculopathy happens when a nerve in the neck (cervical nerve) is pinched or bruised. This condition can develop because of an injury or as part of the normal aging process. Pressure on the cervical nerves can cause pain or numbness that runs from the neck all the way down into the arm and fingers. Usually, this condition gets better with rest. Treatment may be needed if the condition does not improve.  CAUSES This condition may be caused by:  Injury.  Slipped (herniated) disk.  Muscle tightness in the neck because of overuse.  Arthritis.  Breakdown or degeneration in the bones and joints of the spine (spondylosis) due to aging.  Bone spurs that may develop near the cervical nerves. SYMPTOMS Symptoms of this condition include:  Pain that runs from the neck to the arm and hand. The pain can be severe or irritating. It may be worse when the neck is moved.  Numbness or weakness in the affected arm and hand. DIAGNOSIS This condition may be diagnosed based on symptoms, medical history, and a physical exam. You may also have tests, including:  X-rays.  CT scan.  MRI.  Electromyogram (EMG).  Nerve conduction tests.  TREATMENT In many cases, treatment is not needed for this condition. With rest, the condition usually gets better over time. If treatment is needed, options may include:  Wearing a soft neck collar for short periods of time.  Physical therapy to strengthen your neck muscles.  Medicines, such as NSAIDs, oral corticosteroids, or spinal injections.  Surgery. This may be needed if other treatments do not help. Various types of surgery may be done depending on the cause of your problems. HOME CARE INSTRUCTIONS Managing Pain  Take over-the-counter and prescription medicines only as told by your health care  provider.  If directed, apply ice to the affected area.  Put ice in a plastic bag.  Place a towel between your skin and the bag.  Leave the ice on for 20 minutes, 2-3 times per day.  If ice does not help, you can try using heat. Take a warm shower or warm bath, or use a heat pack as told by your health care provider.  Try a gentle neck and shoulder massage to help relieve symptoms. Activity  Rest as needed. Follow instructions from your health care provider about any restrictions on activities.  Do stretching and strengthening exercises as told by your health care provider or physical therapist. General Instructions  If you were given a soft collar, wear it as told by your health care provider.  Use a flat pillow when you sleep.  Keep all follow-up visits as told by your health care provider. This is important. SEEK MEDICAL CARE IF:  Your condition does not improve with treatment. SEEK IMMEDIATE MEDICAL CARE IF:  Your pain gets much worse and cannot be controlled with medicines.  You have weakness or numbness in your hand, arm, face, or leg.  You have a high fever.  You have a stiff, rigid neck.  You lose control of your bowels or your bladder (have incontinence).  You have trouble with walking, balance, or speaking.   This information is not intended to replace advice given to you by your health care provider. Make sure you discuss any questions you have with your health care provider.   Document Released: 06/26/2001 Document Revised: 06/22/2015 Document Reviewed: 11/25/2014 Elsevier Interactive Patient Education [employer].

## 2015-07-29 NOTE — ED Notes (Signed)
Pt reports R arm pain which comes from the R side of her neck to her R shoulder down to her R arm and R hand.  Pt reports this has been going on since beginning of September and has seen her PCP x 2 weeks ago.  Was told that it could be pinched nerve, was given prednisone.  She states that pain got better while taking the prednisone but became bad again after she finished taking them.  She was also instructed to f/u with an orthopedic if the pain recurred after taking the prednisone.  She states she cannot f/u with an orthopedic because she does not have the money.  Pt is teary in triage.  Pt able to move her R arm without any difficulty at this time.

## 2015-07-29 NOTE — ED Provider Notes (Signed)
CSN: [id number]     Arrival date & time 07/29/15  1140 History  By signing my name below, I, Eustaquio Maize, attest that this documentation has been prepared under the direction and in the presence of Lorriann Hansmann, PA-C. Electronically Signed: Eustaquio Maize, ED Scribe. 07/29/2015. 12:22 PM.  Chief Complaint  Patient presents with  . Arm Pain   The history is provided by the patient. No language interpreter was used.     HPI Comments: Janet Marquez is a 44 y.o. female who presents to the Emergency Department complaining of gradual onset, intermittent, sharp stabbing, right neck pain radiating down to right hand x 6 weeks. Denies injury, trauma, or fall. Movement of neck, shoulder and elbow do not exacerbate the pain. 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 The pain is worse at the end of the day after working. Pt states resting helps to relieve the pain. Pt has seen her PCP, Dr. Ronnald Ramp, twice for the same symptoms. Provider did x rays with no acute findings and believed it was a "pinched nerve". She was prescribed Meloxicam, Flexeril, and Prednisone. She was told to take the Prednisone if the Meloxicam was not giving her relief. Pt states that the Meloxicam was not giving her any relief so she began taking the Prednisone. Pt had a 13 day supply and finished approximately 5 days ago. She reports relief with the Prednisone but states her pain returned when she finished taking it. She had mild relief with the Flexeril as well. Pt was told to follow up with a orthopedist if pain persists, but states she is unable to do so because she cannot afford it at the time. She has also tried icy hot and applying heat and ice without relief. Denies weakness, swelling, fever, or any other associated symptoms. Pt has hx of carpal tunnel and wears her brace as often as she can but states she cannot really wear it at work when she  is typing.    Past Medical History  Diagnosis Date  . Hypertension   . Thyroid disease   . Hx of cardiovascular stress test     ETT-echo 3/16: Normal  . Anemia    Past Surgical History  Procedure Laterality Date  . Myomectomy      2002  . Cesarean section    . Laparoscopic assisted vaginal hysterectomy N/A 01/20/2015    Procedure: LAPAROSCOPIC ASSISTED VAGINAL HYSTERECTOMY;  Surgeon: Everlene Farrier, MD;  Location: [facility];  Service: Gynecology;  Laterality: N/A;  . Bilateral salpingectomy Left 01/20/2015    Procedure: LAPAROSCOPIC LEFT SALPINGECTOMY;  Surgeon: Everlene Farrier, MD;  Location: [facility];  Service: Gynecology;  Laterality: Left;   Family History  Problem Relation Age of Onset  . Hypertension Maternal Grandmother   . Heart attack Maternal Grandfather   . Stroke Maternal Grandmother    Social History  Substance Use Topics  . Smoking status: Never Smoker   . Smokeless tobacco: Never Used  . Alcohol Use: No     Comment: socially   OB History    No data available     Review of Systems  Constitutional: Negative for fever.  Musculoskeletal: Positive for arthralgias and neck pain. Negative for joint swelling and neck stiffness.  Skin: Negative for color change, rash and wound.  Neurological: Positive for numbness. Negative for tremors and weakness.   Allergies  Clarithromycin  Home Medications  Prior to Admission medications   Medication Sig Start Date End Date Taking? Authorizing Provider  carboxymethylcellulose 1 % ophthalmic solution Place 1 drop into both eyes 3 (three) times daily.     Historical Provider, MD  chlorthalidone (HYGROTON) 25 MG tablet Take 1 tablet (25 mg total) by mouth daily. 03/07/15   Orma Flaming, MD  cyclobenzaprine (FLEXERIL) 10 MG tablet Take 1 tablet (10 mg total) by mouth 2 (two) times daily as needed for muscle spasms. 07/29/15   Julisa Flippo, PA-C  diclofenac (CATAFLAM) 50 MG tablet Take 1 tablet (50 mg total) by mouth 3 (three) times  daily. 07/29/15   Lahoma Crocker Dmitry Macomber, PA-C  Ferrous Fumarate 324 MG TABS Take 1 tablet (106 mg of iron total) by mouth 3 (three) times daily. 11/23/14   Barton Dubois, MD  lisinopril (PRINIVIL,ZESTRIL) 10 MG tablet Take 1 tablet (10 mg total) by mouth daily. 03/28/15   Elby Beck, FNP  meloxicam (MOBIC) 7.5 MG tablet Take 1 tablet (7.5 mg total) by mouth daily. 07/10/15   Wendie Agreste, MD  metFORMIN (GLUCOPHAGE) 500 MG tablet Take 1 tablet (500 mg total) by mouth 2 (two) times daily with a meal. 03/28/15   Elby Beck, FNP  predniSONE (DELTASONE) 20 MG tablet 3 by mouth for 3 days, then 2 by mouth for 2 days, then 1 by mouth for 2 days, then 1/2 by mouth for 2 days. 07/10/15   Wendie Agreste, MD   Triage Vitals: BP 132/89 mmHg  Pulse 123  Temp(Src) 97.8 F (36.6 C) (Oral)  Resp 16  SpO2 99%  LMP 11/21/2014   Physical Exam  Constitutional: She appears well-developed and well-nourished. No distress.  HENT:  Head: Normocephalic and atraumatic.  Right Ear: External ear normal.  Left Ear: External ear normal.  Eyes: Conjunctivae are normal. Right eye exhibits no discharge. Left eye exhibits no discharge. No scleral icterus.  Neck: Normal range of motion.  Cardiovascular: Normal rate.   Pulmonary/Chest: Effort normal.  Musculoskeletal: Normal range of motion.  FROM of neck, shoulder, elbow and wrist intact and without pain. Mild TTP over trapezius muscle. No spasm noted. No tenderness over arm or hand. Positive right tinnel. Moves all extremities spontaneously and walks with a steady gait.   Neurological: She is alert. Coordination normal.  5/5 shoulder, elbow and grip strength b/l. Sensation to light touch intact throughout. Normal biceps reflex.   Skin: Skin is warm and dry. No rash noted.  No rash or color change over arm.   Psychiatric: She has a normal mood and affect. Her behavior is normal.    ED Course  Procedures (including critical care time)  DIAGNOSTIC  STUDIES: Oxygen Saturation is 99% on RA, normal by my interpretation.    COORDINATION OF CARE: 12:22 PM-Discussed treatment plan which includes rx Cataflam and flexeril with pt at bedside and pt agreed to plan.   Labs Review Labs Reviewed - No data to display  Imaging Review No results found.   EKG Interpretation None      MDM   Final diagnoses:  Neck pain  Carpal tunnel syndrome of right wrist  Cervical radicular pain   Pt presents to the ER with neck pain that is radiating down arm. There has been no recent trauma and imaging is not indicated at this time. Pt has been previously evaluated by their PCP and has taken prednisone taper, meloxicam & Flexeril.  No evidence of cervical nerve root compression on physical exam. DC w  conservative home therapies (ie heat), diclofenac and flexeril. Pt reports she can no longer afford her PCP or orthopedist. Given resource guide and encouraged to find a PCP from the list to follow up with. Return precautions given in discharge paperwork and discussed with pt at bedside. Pt stable for discharge  I personally performed the services described in this documentation, which was scribed in my presence. The recorded information has been reviewed and is accurate.      Lahoma Crocker Echo Propp, PA-C 07/29/15 1305  Gareth Morgan, MD 07/30/15 7471

## 2015-10-20 ENCOUNTER — Other Ambulatory Visit: Payer: Self-pay

## 2015-10-20 DIAGNOSIS — Z1231 Encounter for screening mammogram for malignant neoplasm of breast: Secondary | ICD-10-CM

## 2015-11-08 ENCOUNTER — Ambulatory Visit
Admission: RE | Admit: 2015-11-08 | Discharge: 2015-11-08 | Disposition: A | Discharge status: Home / Self Care | Payer: BLUE CROSS/BLUE SHIELD | Source: Ambulatory Visit

## 2015-11-08 DIAGNOSIS — Z1231 Encounter for screening mammogram for malignant neoplasm of breast: Secondary | ICD-10-CM

## 2016-06-19 ENCOUNTER — Telehealth: Payer: Self-pay | Admitting: Allergy

## 2016-06-19 NOTE — Telephone Encounter (Signed)
Spoke with patient advised we cannot give her anything until seen. Advised if she has antihistamines we cannot test her but if symptoms are bad take medications at appt will be consult and they can discuss giving her something to relief patient verbalizes understanding will keep appt

## 2016-06-19 NOTE — Telephone Encounter (Signed)
This is a New Pt. That has an appt this Thursday, 06/21/16 with Dr. Nelva Bush. She knows she is not to take any antihistamine's before this appt, but she is breaking out with hives and wants advice on if she can take anything.

## 2016-06-21 ENCOUNTER — Ambulatory Visit: Payer: Self-pay | Admitting: Allergy

## 2016-06-27 ENCOUNTER — Other Ambulatory Visit: Payer: Self-pay | Admitting: Orthopaedic Surgery

## 2016-06-27 DIAGNOSIS — M542 Cervicalgia: Secondary | ICD-10-CM

## 2016-07-04 ENCOUNTER — Ambulatory Visit
Admission: RE | Admit: 2016-07-04 | Discharge: 2016-07-04 | Disposition: A | Discharge status: Home / Self Care | Payer: BLUE CROSS/BLUE SHIELD | Source: Ambulatory Visit | Attending: Orthopaedic Surgery | Admitting: Orthopaedic Surgery

## 2016-07-04 DIAGNOSIS — M542 Cervicalgia: Secondary | ICD-10-CM

## 2016-07-10 ENCOUNTER — Encounter (INDEPENDENT_AMBULATORY_CARE_PROVIDER_SITE_OTHER): Payer: Self-pay

## 2016-07-17 ENCOUNTER — Institutional Professional Consult (permissible substitution) (INDEPENDENT_AMBULATORY_CARE_PROVIDER_SITE_OTHER): Payer: BLUE CROSS/BLUE SHIELD | Admitting: Physical Medicine and Rehabilitation

## 2016-07-17 DIAGNOSIS — M5412 Radiculopathy, cervical region: Secondary | ICD-10-CM | POA: Diagnosis not present

## 2016-07-24 ENCOUNTER — Encounter (INDEPENDENT_AMBULATORY_CARE_PROVIDER_SITE_OTHER): Payer: BLUE CROSS/BLUE SHIELD | Admitting: Physical Medicine and Rehabilitation

## 2016-07-24 DIAGNOSIS — M5412 Radiculopathy, cervical region: Secondary | ICD-10-CM | POA: Diagnosis not present

## 2017-01-14 ENCOUNTER — Ambulatory Visit (INDEPENDENT_AMBULATORY_CARE_PROVIDER_SITE_OTHER): Payer: BLUE CROSS/BLUE SHIELD | Admitting: Emergency Medicine

## 2017-01-14 ENCOUNTER — Ambulatory Visit (INDEPENDENT_AMBULATORY_CARE_PROVIDER_SITE_OTHER): Payer: BLUE CROSS/BLUE SHIELD

## 2017-01-14 VITALS — BP 154/86 | HR 135 | Temp 99.1°F | Resp 17 | Ht 67.0 in | Wt 193.0 lb

## 2017-01-14 DIAGNOSIS — M940 Chondrocostal junction syndrome [Tietze]: Secondary | ICD-10-CM | POA: Diagnosis not present

## 2017-01-14 DIAGNOSIS — R0789 Other chest pain: Secondary | ICD-10-CM | POA: Diagnosis not present

## 2017-01-14 DIAGNOSIS — R000 Tachycardia, unspecified: Secondary | ICD-10-CM

## 2017-01-14 MED ORDER — DICLOFENAC SODIUM 75 MG PO TBEC: 75.0000 mg | DELAYED_RELEASE_TABLET | Freq: Two times a day (BID) | ORAL | 0 refills | Status: AC

## 2017-01-14 NOTE — Progress Notes (Addendum)
Janet Marquez 46 y.o.   Chief Complaint  Patient presents with  . Chest Pain    left side     HISTORY OF PRESENT ILLNESS: This is a 46 y.o. female complaining of left sided sharp steady chest pain x 3-4 days without associated symptoms. Has h/o chronic tachycardia with extensive cardiac workup in the past and no abnormal findings.  HPI   Prior to Admission medications   Medication Sig Start Date End Date Taking? Authorizing Provider  carboxymethylcellulose 1 % ophthalmic solution Place 1 drop into both eyes 3 (three) times daily.    Yes Historical Provider, MD  metFORMIN (GLUCOPHAGE) 500 MG tablet Take 1 tablet (500 mg total) by mouth 2 (two) times daily with a meal. 03/28/15  Yes Elby Beck, FNP    Allergies  Allergen Reactions  . Clarithromycin Nausea And Vomiting    Patient Active Problem List   Diagnosis Date Noted  . Fibroids 01/20/2015  . Sinus tachycardia 11/23/2014  . Chest pain 11/22/2014  . Hypothyroidism 12/14/2009  . DM2 (diabetes mellitus, type 2) ([place]) 12/14/2009  . Anemia 12/14/2009  . Essential hypertension 12/14/2009  . GERD 12/14/2009    Past Medical History:  Diagnosis Date  . Anemia   . Hx of cardiovascular stress test    ETT-echo 3/16: Normal  . Hypertension   . Thyroid disease     Past Surgical History:  Procedure Laterality Date  . BILATERAL SALPINGECTOMY Left 01/20/2015   Procedure: LAPAROSCOPIC LEFT SALPINGECTOMY;  Surgeon: Everlene Farrier, MD;  Location: [facility];  Service: Gynecology;  Laterality: Left;  . CESAREAN SECTION    . LAPAROSCOPIC ASSISTED VAGINAL HYSTERECTOMY N/A 01/20/2015   Procedure: LAPAROSCOPIC ASSISTED VAGINAL HYSTERECTOMY;  Surgeon: Everlene Farrier, MD;  Location: [facility];  Service: Gynecology;  Laterality: N/A;  . MYOMECTOMY     2002    Social History   Social History  . Marital status: Single    Spouse name: N/A  . Number of children: N/A  . Years of education: N/A   Occupational History  . Not on file.     Social History Main Topics  . Smoking status: Never Smoker  . Smokeless tobacco: Never Used  . Alcohol use No     Comment: socially  . Drug use: No  . Sexual activity: Not on file   Other Topics Concern  . Not on file   Social History Narrative  . No narrative on file    Family History  Problem Relation Age of Onset  . Hypertension Maternal Grandmother   . Heart attack Maternal Grandfather   . Stroke Maternal Grandmother      Review of Systems  Constitutional: Negative for chills, fever and malaise/fatigue.  HENT: Negative.   Eyes: Negative.   Respiratory: Positive for cough and sputum production. Negative for hemoptysis, shortness of breath and wheezing.   Cardiovascular: Positive for chest pain. Negative for palpitations, orthopnea, leg swelling and PND.  Gastrointestinal: Negative for abdominal pain, diarrhea, nausea and vomiting.  Genitourinary: Negative for dysuria and hematuria.  Musculoskeletal: Negative for back pain, myalgias and neck pain.  Skin: Negative.  Negative for rash.  Neurological: Negative for dizziness, sensory change, focal weakness, weakness and headaches.  Endo/Heme/Allergies: Negative.   Psychiatric/Behavioral: Negative for depression and suicidal ideas. The patient is not nervous/anxious.   All other systems reviewed and are negative.  Vitals:   01/14/17 0842 01/14/17 0956  BP: (!) 152/91 (!) 154/86  Pulse: (!) 135   Resp: 17  Temp: 99.1 F (37.3 C)    EKG: Sinus tachycardia; no acute ischemic changes.  Physical Exam  Constitutional: She is oriented to person, place, and time. She appears well-developed and well-nourished.  HENT:  Head: Normocephalic and atraumatic.  Nose: Nose normal.  Mouth/Throat: Oropharynx is clear and moist. No oropharyngeal exudate.  Eyes: Conjunctivae and EOM are normal. Pupils are equal, round, and reactive to light.  Neck: Normal range of motion. Neck supple. No JVD present. No thyromegaly present.   Cardiovascular: Normal heart sounds.  Tachycardia present.   No murmur heard. Pulmonary/Chest: Effort normal and breath sounds normal. She exhibits tenderness (left lateral tenderness).  Abdominal: Soft. Bowel sounds are normal. She exhibits no distension. There is no tenderness.  Musculoskeletal: Normal range of motion.  Lymphadenopathy:    She has no cervical adenopathy.  Neurological: She is alert and oriented to person, place, and time. No sensory deficit. She exhibits normal muscle tone.  Skin: Skin is warm and dry. Capillary refill takes less than 2 seconds. No rash noted.  Psychiatric: She has a normal mood and affect. Her behavior is normal.  Vitals reviewed.  CXR reviewed by me: NAD  ASSESSMENT & PLAN: [name] was seen today for chest pain.  Diagnoses and all orders for this visit:  Other chest pain -     EKG 12-Lead -     CBC with Differential/Platelet -     Comprehensive metabolic panel -     DG Chest 2 View; Future  Atypical chest pain  Costochondritis -     diclofenac (VOLTAREN) 75 MG EC tablet; Take 1 tablet (75 mg total) by mouth 2 (two) times daily.  Chronic tachycardia    Patient Instructions       IF you received an x-ray today, you will receive an invoice from [facility]. Please contact [facility] at [phone] with questions or concerns regarding your invoice.   IF you received labwork today, you will receive an invoice from [facility]. Please contact [facility] at [phone] with questions or concerns regarding your invoice.   Our billing staff will not be able to assist you with questions regarding bills from these companies.  You will be contacted with the lab results as soon as they are available. The fastest way to get your results is to activate your My Chart account. Instructions are located on the last page of this paperwork. If you have not heard from [place] regarding the results in 2 weeks, please contact this office.       Costochondritis Costochondritis is swelling and irritation (inflammation) of the tissue (cartilage) that connects your ribs to your breastbone (sternum). This causes pain in the front of your chest. Usually, the pain:  Starts gradually.  Is in more than one rib. This condition usually goes away on its own over time. Follow these instructions at home:  Do not do anything that makes your pain worse.  If directed, put ice on the painful area:  Put ice in a plastic bag.  Place a towel between your skin and the bag.  Leave the ice on for 20 minutes, 2-3 times a day.  If directed, put heat on the affected area as often as told by your doctor. Use the heat source that your doctor tells you to use, such as a moist heat pack or a heating pad.  Place a towel between your skin and the heat source.  Leave the heat on for 20-30 minutes.  Take off the heat if your  skin turns bright red. This is very important if you cannot feel pain, heat, or cold. You may have a greater risk of getting burned.  Take over-the-counter and prescription medicines only as told by your doctor.  Return to your normal activities as told by your doctor. Ask your doctor what activities are safe for you.  Keep all follow-up visits as told by your doctor. This is important. Contact a doctor if:  You have chills or a fever.  Your pain does not go away or it gets worse.  You have a cough that does not go away. Get help right away if:  You are short of breath. This information is not intended to replace advice given to you by your health care provider. Make sure you discuss any questions you have with your health care provider. Document Released: 03/19/2008 Document Revised: 04/20/2016 Document Reviewed: 01/25/2016 Elsevier Interactive Patient Education  2017 [employer]vier Inc.      Agustina Caroli, MD [facility]

## 2017-01-14 NOTE — Patient Instructions (Addendum)
     IF you received an x-ray today, you will receive an invoice from [facility]. Please contact [facility] at [phone] with questions or concerns regarding your invoice.   IF you received labwork today, you will receive an invoice from [facility]. Please contact [facility] at [phone] with questions or concerns regarding your invoice.   Our billing staff will not be able to assist you with questions regarding bills from these companies.  You will be contacted with the lab results as soon as they are available. The fastest way to get your results is to activate your My Chart account. Instructions are located on the last page of this paperwork. If you have not heard from [place] regarding the results in 2 weeks, please contact this office.      Costochondritis Costochondritis is swelling and irritation (inflammation) of the tissue (cartilage) that connects your ribs to your breastbone (sternum). This causes pain in the front of your chest. Usually, the pain:  Starts gradually.  Is in more than one rib. This condition usually goes away on its own over time. Follow these instructions at home:  Do not do anything that makes your pain worse.  If directed, put ice on the painful area:  Put ice in a plastic bag.  Place a towel between your skin and the bag.  Leave the ice on for 20 minutes, 2-3 times a day.  If directed, put heat on the affected area as often as told by your doctor. Use the heat source that your doctor tells you to use, such as a moist heat pack or a heating pad.  Place a towel between your skin and the heat source.  Leave the heat on for 20-30 minutes.  Take off the heat if your skin turns bright red. This is very important if you cannot feel pain, heat, or cold. You may have a greater risk of getting burned.  Take over-the-counter and prescription medicines only as told by your doctor.  Return to your normal activities as told by your doctor.  Ask your doctor what activities are safe for you.  Keep all follow-up visits as told by your doctor. This is important. Contact a doctor if:  You have chills or a fever.  Your pain does not go away or it gets worse.  You have a cough that does not go away. Get help right away if:  You are short of breath. This information is not intended to replace advice given to you by your health care provider. Make sure you discuss any questions you have with your health care provider. Document Released: 03/19/2008 Document Revised: 04/20/2016 Document Reviewed: 01/25/2016 Elsevier Interactive Patient Education  2017 [employer].

## 2017-01-15 LAB — CBC WITH DIFFERENTIAL/PLATELET
Basophils Absolute: 0 10*3/uL (ref 0.0–0.2)
Basos: 0 %
EOS (ABSOLUTE): 0.1 10*3/uL (ref 0.0–0.4)
Eos: 0 %
Hematocrit: 35.6 % (ref 34.0–46.6)
Hemoglobin: 11.8 g/dL (ref 11.1–15.9)
Immature Grans (Abs): 0 10*3/uL (ref 0.0–0.1)
Immature Granulocytes: 0 %
LYMPHS ABS: 1.8 10*3/uL (ref 0.7–3.1)
Lymphs: 11 %
MCH: 26.4 pg — AB (ref 26.6–33.0)
MCHC: 33.1 g/dL (ref 31.5–35.7)
MCV: 80 fL (ref 79–97)
Monocytes Absolute: 0.8 10*3/uL (ref 0.1–0.9)
Monocytes: 5 %
NEUTROS ABS: 13.6 10*3/uL — AB (ref 1.4–7.0)
Neutrophils: 84 %
PLATELETS: 395 10*3/uL — AB (ref 150–379)
RBC: 4.47 x10E6/uL (ref 3.77–5.28)
RDW: 14.6 % (ref 12.3–15.4)
WBC: 16.3 10*3/uL — ABNORMAL HIGH (ref 3.4–10.8)

## 2017-01-15 LAB — COMPREHENSIVE METABOLIC PANEL
ALK PHOS: 86 IU/L (ref 39–117)
ALT: 10 IU/L (ref 0–32)
AST: 16 IU/L (ref 0–40)
Albumin/Globulin Ratio: 1.6 (ref 1.2–2.2)
Albumin: 4.5 g/dL (ref 3.5–5.5)
BILIRUBIN TOTAL: 0.5 mg/dL (ref 0.0–1.2)
BUN / CREAT RATIO: 10 (ref 9–23)
BUN: 8 mg/dL (ref 6–24)
CO2: 21 mmol/L (ref 18–29)
Calcium: 9.5 mg/dL (ref 8.7–10.2)
Chloride: 100 mmol/L (ref 96–106)
Creatinine, Ser: 0.82 mg/dL (ref 0.57–1.00)
GFR calc non Af Amer: 86 mL/min/{1.73_m2} (ref >59)
GFR, EST AFRICAN AMERICAN: 99 mL/min/{1.73_m2} (ref >59)
GLUCOSE: 116 mg/dL — AB (ref 65–99)
Globulin, Total: 2.9 g/dL (ref 1.5–4.5)
Potassium: 4 mmol/L (ref 3.5–5.2)
Sodium: 140 mmol/L (ref 134–144)
Total Protein: 7.4 g/dL (ref 6.0–8.5)

## 2017-01-18 NOTE — Progress Notes (Signed)
Thank you :)

## 2017-10-15 HISTORY — PX: LIPOMA EXCISION: SHX5283

## 2018-07-03 ENCOUNTER — Other Ambulatory Visit: Payer: Self-pay | Admitting: Physician Assistant

## 2018-07-03 DIAGNOSIS — M542 Cervicalgia: Secondary | ICD-10-CM

## 2018-07-18 ENCOUNTER — Ambulatory Visit (INDEPENDENT_AMBULATORY_CARE_PROVIDER_SITE_OTHER): Payer: 59 | Admitting: Physical Medicine and Rehabilitation

## 2018-07-18 ENCOUNTER — Telehealth (INDEPENDENT_AMBULATORY_CARE_PROVIDER_SITE_OTHER): Payer: Self-pay | Admitting: Physical Medicine and Rehabilitation

## 2018-07-18 ENCOUNTER — Encounter (INDEPENDENT_AMBULATORY_CARE_PROVIDER_SITE_OTHER): Payer: Self-pay | Admitting: Physical Medicine and Rehabilitation

## 2018-07-18 ENCOUNTER — Encounter

## 2018-07-18 VITALS — BP 155/95 | HR 108 | Ht 66.0 in | Wt 205.0 lb

## 2018-07-18 DIAGNOSIS — F411 Generalized anxiety disorder: Secondary | ICD-10-CM

## 2018-07-18 DIAGNOSIS — M542 Cervicalgia: Secondary | ICD-10-CM

## 2018-07-18 DIAGNOSIS — R202 Paresthesia of skin: Secondary | ICD-10-CM

## 2018-07-18 DIAGNOSIS — M5412 Radiculopathy, cervical region: Secondary | ICD-10-CM

## 2018-07-18 MED ORDER — DIAZEPAM 5 MG PO TABS: ORAL_TABLET | ORAL | 0 refills | Status: DC

## 2018-07-18 NOTE — Telephone Encounter (Signed)
Submitted clinical notes to [facility] website, case is pending.

## 2018-07-18 NOTE — Progress Notes (Signed)
 .  Numeric Pain Rating Scale and Functional Assessment Average Pain 7 Pain Right Now 7 My pain is constant, sharp, dull and tingling Pain is worse with: some activites Pain improves with: rest, heat/ice and medication   In the last MONTH (on 0-10 scale) has pain interfered with the following?  1. General activity like being  able to carry out your everyday physical activities such as walking, climbing stairs, carrying groceries, or moving a chair?  Rating(6)  2. Relation with others like being able to carry out your usual social activities and roles such as  activities at home, at work and in your community. Rating(4)  3. Enjoyment of life such that you have  been bothered by emotional problems such as feeling anxious, depressed or irritable?  Rating(2)

## 2018-07-23 NOTE — Telephone Encounter (Signed)
Per [facility] website prior authorization has been approved. Auth# [id number] Pt is scheduled with driver.

## 2018-07-30 ENCOUNTER — Encounter (INDEPENDENT_AMBULATORY_CARE_PROVIDER_SITE_OTHER): Payer: Self-pay | Admitting: Physical Medicine and Rehabilitation

## 2018-07-30 ENCOUNTER — Ambulatory Visit (INDEPENDENT_AMBULATORY_CARE_PROVIDER_SITE_OTHER): Payer: 59 | Admitting: Physical Medicine and Rehabilitation

## 2018-07-30 ENCOUNTER — Encounter

## 2018-07-30 ENCOUNTER — Ambulatory Visit (INDEPENDENT_AMBULATORY_CARE_PROVIDER_SITE_OTHER): Payer: Self-pay

## 2018-07-30 VITALS — BP 141/101 | HR 85 | Temp 98.5°F

## 2018-07-30 DIAGNOSIS — M5412 Radiculopathy, cervical region: Secondary | ICD-10-CM | POA: Diagnosis not present

## 2018-07-30 MED ORDER — METHYLPREDNISOLONE ACETATE 80 MG/ML IJ SUSP
80.0000 mg | Freq: Once | INTRAMUSCULAR | Status: AC
  Administered 2018-07-30: 80 mg

## 2018-07-30 NOTE — Patient Instructions (Signed)

## 2018-07-30 NOTE — Progress Notes (Signed)
 .  Numeric Pain Rating Scale and Functional Assessment Average Pain 6   In the last MONTH (on 0-10 scale) has pain interfered with the following?  1. General activity like being  able to carry out your everyday physical activities such as walking, climbing stairs, carrying groceries, or moving a chair?  Rating(4)   +Driver, -BT, -Dye Allergies.  

## 2018-07-31 NOTE — Progress Notes (Signed)
Janet Marquez - 47 y.o. female MRN [id number]  Date of birth: [birth year]  Office Visit Note: Visit Date: 07/30/2018 PCP: Patient, No Pcp Per Referred by: No ref. provider found  Subjective: Chief Complaint  Patient presents with  . Neck - Pain  . Right Shoulder - Pain, Numbness, Tingling  . Right Arm - Tingling, Pain, Numbness   HPI:  Janet Marquez is a 47 y.o. female who comes in today For planned right C7-T1 interlaminar epidural steroid injection.  Please see our prior evaluation and management note for further details and justification.  ROS Otherwise per HPI.  Assessment & Plan: Visit Diagnoses:  1. Cervical radiculopathy     Plan: No additional findings.   Meds & Orders:  Meds ordered this encounter  Medications  . methylPREDNISolone acetate (DEPO-MEDROL) injection 80 mg    Orders Placed This Encounter  Procedures  . XR C-ARM NO REPORT  . Epidural Steroid injection    Follow-up: Return if symptoms worsen or fail to improve.   Procedures: No procedures performed  Cervical Epidural Steroid Injection - Interlaminar Approach with Fluoroscopic Guidance  Patient: Janet Marquez      Date of Birth: 1971/02/26 MRN: [id number] PCP: Patient, No Pcp Per      Visit Date: 07/30/2018   Universal Protocol:    Date/Time: 10/17/196:06 AM  Consent Given By: the patient  Position: PRONE  Additional Comments: Vital signs were monitored before and after the procedure. Patient was prepped and draped in the usual sterile fashion. The correct patient, procedure, and site was verified.   Injection Procedure Details:  Procedure Site One Meds Administered:  Meds ordered this encounter  Medications  . methylPREDNISolone acetate (DEPO-MEDROL) injection 80 mg     Laterality: Right  Location/Site: C7-T1  Needle size: 20 G  Needle type: Touhy  Needle Placement: Paramedian epidural space  Findings:  -Comments: Excellent flow of contrast into the epidural  space.  Procedure Details: Using a paramedian approach from the side mentioned above, the region overlying the inferior lamina was localized under fluoroscopic visualization and the soft tissues overlying this structure were infiltrated with 4 ml. of 1% Lidocaine without Epinephrine. A # 20 gauge, Tuohy needle was inserted into the epidural space using a paramedian approach.  The epidural space was localized using loss of resistance along with lateral and contralateral oblique bi-planar fluoroscopic views.  After negative aspirate for air, blood, and CSF, a 2 ml. volume of Isovue-250 was injected into the epidural space and the flow of contrast was observed. Radiographs were obtained for documentation purposes.   The injectate was administered into the level noted above.  Additional Comments:  The patient tolerated the procedure well Dressing: Band-Aid    Post-procedure details: Patient was observed during the procedure. Post-procedure instructions were reviewed.  Patient left the clinic in stable condition.   Clinical History: MRI CERVICAL SPINE WITHOUT CONTRAST  TECHNIQUE: Multiplanar, multisequence MR imaging of the cervical spine was performed. No intravenous contrast was administered.  COMPARISON: Prior radiograph from 06/26/2016.  FINDINGS: Alignment: Straightening of the normal cervical lordosis. No listhesis.  Vertebrae: Vertebral body heights maintained. No evidence for acute or chronic fracture. Benign hemangioma noted within the T2 vertebral body. Signal intensity within the vertebral body bone marrow is normal.  Cord: Signal intensity within the cervical spinal cord is normal.  Posterior Fossa, vertebral arteries, paraspinal tissues: Visualized portions of the brain and posterior fossa are within normal limits. Craniocervical junction normal. Paraspinous and prevertebral soft tissues  demonstrate no acute abnormality. Normal intravascular flow voids  present within the vertebral arteries bilaterally.  Disc levels:  C2-C3: Negative.  C3-C4: Negative.  C4-C5: Minimal annular disc bulge with bilateral uncovertebral hypertrophy. No significant stenosis.  C5-C6: Mild diffuse degenerative disc bulge. Superimposed right uncovertebral hypertrophy. Resultant right foraminal disc osteophyte complex results in moderate right foraminal stenosis (series 7, image 14). No significant left foraminal narrowing. Central canal remains widely patent.  C6-C7: Negative.  C7-T1: Negative.  The visualized portions of the upper thoracic spine are unremarkable. The  IMPRESSION: 1. Right foraminal disc osteophyte complex at C5-6 no with resultant moderate right foraminal narrowing. This could potentially result in right upper extremity radicular symptoms. 2. Minimal disc bulge at C4-5 without stenosis. 3. No other significant degenerative changes within the cervical spine. No canal stenosis.   Electronically Signed By: Jeannine Boga M.D. On: 07/05/2016 06:20  06/05/2013 EMG/NCS Impression: Essentially NORMAL electrodiagnostic study of.  There is no significant electrodiagnostic evidence of nerve entrapment, brachial plexopathy or cervical radiculopathy.    As you know, purely sensory or demyelinating radiculopathies and chemical radiculitis may not be detected with this particular electrodiagnostic study.     Objective:  VS:  HT:    WT:   BMI:     BP:(!) 141/101  HR:85bpm  TEMP:98.5 F (36.9 C)(Oral)  RESP:  Physical Exam  Ortho Exam Imaging: Xr C-arm No Report  Result Date: 07/30/2018 Please see Notes tab for imaging impression.

## 2018-07-31 NOTE — Procedures (Signed)
Cervical Epidural Steroid Injection - Interlaminar Approach with Fluoroscopic Guidance  Patient: Janet Marquez      Date of Birth: [birth year] MRN: [id number] PCP: Patient, No Pcp Per      Visit Date: 07/30/2018   Universal Protocol:    Date/Time: 10/17/196:06 AM  Consent Given By: the patient  Position: PRONE  Additional Comments: Vital signs were monitored before and after the procedure. Patient was prepped and draped in the usual sterile fashion. The correct patient, procedure, and site was verified.   Injection Procedure Details:  Procedure Site One Meds Administered:  Meds ordered this encounter  Medications  . methylPREDNISolone acetate (DEPO-MEDROL) injection 80 mg     Laterality: Right  Location/Site: C7-T1  Needle size: 20 G  Needle type: Touhy  Needle Placement: Paramedian epidural space  Findings:  -Comments: Excellent flow of contrast into the epidural space.  Procedure Details: Using a paramedian approach from the side mentioned above, the region overlying the inferior lamina was localized under fluoroscopic visualization and the soft tissues overlying this structure were infiltrated with 4 ml. of 1% Lidocaine without Epinephrine. A # 20 gauge, Tuohy needle was inserted into the epidural space using a paramedian approach.  The epidural space was localized using loss of resistance along with lateral and contralateral oblique bi-planar fluoroscopic views.  After negative aspirate for air, blood, and CSF, a 2 ml. volume of Isovue-250 was injected into the epidural space and the flow of contrast was observed. Radiographs were obtained for documentation purposes.   The injectate was administered into the level noted above.  Additional Comments:  The patient tolerated the procedure well Dressing: Band-Aid    Post-procedure details: Patient was observed during the procedure. Post-procedure instructions were reviewed.  Patient left the clinic in stable  condition.

## 2018-08-13 ENCOUNTER — Telehealth (INDEPENDENT_AMBULATORY_CARE_PROVIDER_SITE_OTHER): Payer: Self-pay | Admitting: Physical Medicine and Rehabilitation

## 2018-08-13 NOTE — Telephone Encounter (Signed)
Would repeat

## 2018-08-13 NOTE — Telephone Encounter (Signed)
Needs auth for [phone]. Patient is not yet scheduled.

## 2018-08-13 NOTE — Telephone Encounter (Signed)
Thank you for your online Notification/Prior Authorization submission.  The notification/prior authorization case information was transmitted on 08/13/2018 at 3:35 PM CDT. The notification/prior authorization reference number is [id number].

## 2018-08-14 ENCOUNTER — Telehealth (INDEPENDENT_AMBULATORY_CARE_PROVIDER_SITE_OTHER): Payer: Self-pay | Admitting: Radiology

## 2018-08-14 NOTE — Telephone Encounter (Signed)
Called pt and lvm #1 

## 2018-08-14 NOTE — Telephone Encounter (Signed)
Left message checking status on authorization to schedule appointment.  Call # [phone]

## 2018-08-15 NOTE — Telephone Encounter (Signed)
Scheduled for 11/14 at 1500.

## 2018-08-25 ENCOUNTER — Telehealth (INDEPENDENT_AMBULATORY_CARE_PROVIDER_SITE_OTHER): Payer: Self-pay | Admitting: Physical Medicine and Rehabilitation

## 2018-08-27 ENCOUNTER — Other Ambulatory Visit (INDEPENDENT_AMBULATORY_CARE_PROVIDER_SITE_OTHER): Payer: Self-pay | Admitting: Physical Medicine and Rehabilitation

## 2018-08-27 MED ORDER — DIAZEPAM 5 MG PO TABS: ORAL_TABLET | ORAL | 0 refills | Status: DC

## 2018-08-27 NOTE — Telephone Encounter (Signed)
done

## 2018-08-27 NOTE — Progress Notes (Signed)
Pre-procedure diazepam ordered for pre-operative anxiety.  

## 2018-08-27 NOTE — Telephone Encounter (Signed)
Patient notified

## 2018-08-28 ENCOUNTER — Ambulatory Visit (INDEPENDENT_AMBULATORY_CARE_PROVIDER_SITE_OTHER): Payer: Self-pay

## 2018-08-28 ENCOUNTER — Ambulatory Visit (INDEPENDENT_AMBULATORY_CARE_PROVIDER_SITE_OTHER): Payer: 59 | Admitting: Physical Medicine and Rehabilitation

## 2018-08-28 ENCOUNTER — Encounter (INDEPENDENT_AMBULATORY_CARE_PROVIDER_SITE_OTHER): Payer: Self-pay | Admitting: Physical Medicine and Rehabilitation

## 2018-08-28 VITALS — BP 152/103 | HR 115 | Temp 97.9°F

## 2018-08-28 DIAGNOSIS — M5412 Radiculopathy, cervical region: Secondary | ICD-10-CM | POA: Diagnosis not present

## 2018-08-28 DIAGNOSIS — M5010 Cervical disc disorder with radiculopathy, unspecified cervical region: Secondary | ICD-10-CM

## 2018-08-28 MED ORDER — METHYLPREDNISOLONE ACETATE 80 MG/ML IJ SUSP
80.0000 mg | Freq: Once | INTRAMUSCULAR | Status: AC
  Administered 2018-08-28: 80 mg

## 2018-08-28 NOTE — Progress Notes (Signed)
Pt states pain in neck that radiates into the right arm. Pt states pain started back 2 weeks ago. Pt states every day activities makes pain worse, lidocaine patch eases pain.  .Numeric Pain Rating Scale and Functional Assessment Average Pain 7   In the last MONTH (on 0-10 scale) has pain interfered with the following?  1. General activity like being  able to carry out your everyday physical activities such as walking, climbing stairs, carrying groceries, or moving a chair?  Rating(4)   +Driver, -BT, -Dye Allergies.

## 2018-08-28 NOTE — Patient Instructions (Signed)

## 2018-08-29 ENCOUNTER — Other Ambulatory Visit (INDEPENDENT_AMBULATORY_CARE_PROVIDER_SITE_OTHER): Payer: Self-pay | Admitting: Physical Medicine and Rehabilitation

## 2018-08-29 MED ORDER — TRIAZOLAM 0.25 MG PO TABS: ORAL_TABLET | ORAL | 0 refills | Status: DC

## 2018-08-29 NOTE — Progress Notes (Signed)
Pre-procedure halcion ordered for pre-operative anxiety.

## 2018-08-29 NOTE — Progress Notes (Signed)
Patient is a 47 year old female who is coming in today for planned cervical epidural injection.  She has had prior injections beneficial and this can be reviewed in my prior notes.  She obviously suffers from anxiety disorder and she does take Paxil chronically.  We did give her Valium once again prior to the procedure.  Instructions on the Valium says take 1 5 mg tablet and can use the other one just in case on case that one is misplaced.  She did take both tablets.  Unfortunately her anxiety level is such that she cannot really even start the procedure today.  Given the fact that we prepped the room and had the patient on the table ready to begin we completed about 10% of the procedure.  We will try to reschedule her and gave her instructions on breathing exercises for anxiety as well as potentially giving her a different type of medication prior to the injection such as hydroxyzine or a different benzodiazepine.

## 2018-09-02 ENCOUNTER — Ambulatory Visit (INDEPENDENT_AMBULATORY_CARE_PROVIDER_SITE_OTHER): Payer: 59 | Admitting: Physical Medicine and Rehabilitation

## 2018-09-02 ENCOUNTER — Encounter (INDEPENDENT_AMBULATORY_CARE_PROVIDER_SITE_OTHER): Payer: Self-pay | Admitting: Physical Medicine and Rehabilitation

## 2018-09-02 ENCOUNTER — Ambulatory Visit (INDEPENDENT_AMBULATORY_CARE_PROVIDER_SITE_OTHER): Payer: Self-pay

## 2018-09-02 VITALS — BP 132/89 | HR 84 | Temp 97.8°F

## 2018-09-02 DIAGNOSIS — M5010 Cervical disc disorder with radiculopathy, unspecified cervical region: Secondary | ICD-10-CM | POA: Diagnosis not present

## 2018-09-02 DIAGNOSIS — M5412 Radiculopathy, cervical region: Secondary | ICD-10-CM

## 2018-09-02 MED ORDER — METHYLPREDNISOLONE ACETATE 80 MG/ML IJ SUSP
80.0000 mg | Freq: Once | INTRAMUSCULAR | Status: AC
  Administered 2018-09-02: 80 mg

## 2018-09-02 NOTE — Patient Instructions (Signed)

## 2018-09-02 NOTE — Progress Notes (Signed)
  Numeric Pain Rating Scale and Functional Assessment Average Pain 4   In the last MONTH (on 0-10 scale) has pain interfered with the following?  1. General activity like being  able to carry out your everyday physical activities such as walking, climbing stairs, carrying groceries, or moving a chair?  Rating(8)   +Driver, +BT, -Dye Allergies.  

## 2018-09-04 NOTE — Progress Notes (Signed)
Janet Marquez - 47 y.o. female MRN [id number]  Date of birth: 24-Feb-1971  Office Visit Note: Visit Date: 09/02/2018 PCP: Patient, No Pcp Per Referred by: No ref. provider found  Subjective: Chief Complaint  Patient presents with  . Neck - Pain   HPI:  Janet Marquez is a 47 y.o. female who comes in today For planned repeat right C7-T1 interlaminar epidural steroid injection.  Please see our prior evaluation and management note for further details and justification.  Patient was given Halcion this time instead of Valium and the patient is much more relaxed and does feel like she will be able to continue with the injection.  First injection went well with Valium however she did fine.  Patient does suffer from some anxiety and preprocedure anxiety.  ROS Otherwise per HPI.  Assessment & Plan: Visit Diagnoses:  1. Cervical radiculopathy   2. Cervical disc disorder with radiculopathy     Plan: No additional findings.   Meds & Orders:  Meds ordered this encounter  Medications  . methylPREDNISolone acetate (DEPO-MEDROL) injection 80 mg    Orders Placed This Encounter  Procedures  . XR C-ARM NO REPORT  . Epidural Steroid injection    Follow-up: Return if symptoms worsen or fail to improve.   Procedures: No procedures performed  Cervical Epidural Steroid Injection - Interlaminar Approach with Fluoroscopic Guidance  Patient: Janet Marquez      Date of Birth: [birth year] MRN: [id number] PCP: Patient, No Pcp Per      Visit Date: 09/02/2018   Universal Protocol:    Date/Time: 11/21/198:29 AM  Consent Given By: the patient  Position: PRONE  Additional Comments: Vital signs were monitored before and after the procedure. Patient was prepped and draped in the usual sterile fashion. The correct patient, procedure, and site was verified.   Injection Procedure Details:  Procedure Site One Meds Administered:  Meds ordered this encounter  Medications  .  methylPREDNISolone acetate (DEPO-MEDROL) injection 80 mg     Laterality: Right  Location/Site: C7-T1  Needle size: 20 G  Needle type: Touhy  Needle Placement: Paramedian epidural space  Findings:  -Comments: Excellent flow of contrast into the epidural space.  Procedure Details: Using a paramedian approach from the side mentioned above, the region overlying the inferior lamina was localized under fluoroscopic visualization and the soft tissues overlying this structure were infiltrated with 4 ml. of 1% Lidocaine without Epinephrine. A # 20 gauge, Tuohy needle was inserted into the epidural space using a paramedian approach.  The epidural space was localized using loss of resistance along with lateral and contralateral oblique bi-planar fluoroscopic views.  After negative aspirate for air, blood, and CSF, a 2 ml. volume of Isovue-250 was injected into the epidural space and the flow of contrast was observed. Radiographs were obtained for documentation purposes.   The injectate was administered into the level noted above.  Additional Comments:  No complications occurred Dressing: Band-Aid    Post-procedure details: Patient was observed during the procedure. Post-procedure instructions were reviewed.  Patient left the clinic in stable condition.    Clinical History: MRI CERVICAL SPINE WITHOUT CONTRAST  TECHNIQUE: Multiplanar, multisequence MR imaging of the cervical spine was performed. No intravenous contrast was administered.  COMPARISON: Prior radiograph from 06/26/2016.  FINDINGS: Alignment: Straightening of the normal cervical lordosis. No listhesis.  Vertebrae: Vertebral body heights maintained. No evidence for acute or chronic fracture. Benign hemangioma noted within the T2 vertebral body. Signal intensity within the vertebral body  bone marrow is normal.  Cord: Signal intensity within the cervical spinal cord is normal.  Posterior Fossa, vertebral  arteries, paraspinal tissues: Visualized portions of the brain and posterior fossa are within normal limits. Craniocervical junction normal. Paraspinous and prevertebral soft tissues demonstrate no acute abnormality. Normal intravascular flow voids present within the vertebral arteries bilaterally.  Disc levels:  C2-C3: Negative.  C3-C4: Negative.  C4-C5: Minimal annular disc bulge with bilateral uncovertebral hypertrophy. No significant stenosis.  C5-C6: Mild diffuse degenerative disc bulge. Superimposed right uncovertebral hypertrophy. Resultant right foraminal disc osteophyte complex results in moderate right foraminal stenosis (series 7, image 14). No significant left foraminal narrowing. Central canal remains widely patent.  C6-C7: Negative.  C7-T1: Negative.  The visualized portions of the upper thoracic spine are unremarkable. The  IMPRESSION: 1. Right foraminal disc osteophyte complex at C5-6 no with resultant moderate right foraminal narrowing. This could potentially result in right upper extremity radicular symptoms. 2. Minimal disc bulge at C4-5 without stenosis. 3. No other significant degenerative changes within the cervical spine. No canal stenosis.   Electronically Signed By: Jeannine Boga M.D. On: 07/05/2016 06:20  06/05/2013 EMG/NCS Impression: Essentially NORMAL electrodiagnostic study of.  There is no significant electrodiagnostic evidence of nerve entrapment, brachial plexopathy or cervical radiculopathy.    As you know, purely sensory or demyelinating radiculopathies and chemical radiculitis may not be detected with this particular electrodiagnostic study.     Objective:  VS:  HT:    WT:   BMI:     BP:132/89  HR:84bpm  TEMP:97.8 F (36.6 C)(Oral)  RESP:99 % Physical Exam  Ortho Exam Imaging: No results found.

## 2018-09-04 NOTE — Procedures (Signed)
Cervical Epidural Steroid Injection - Interlaminar Approach with Fluoroscopic Guidance  Patient: Janet Marquez      Date of Birth: [birth year] MRN: [id number] PCP: Patient, No Pcp Per      Visit Date: 09/02/2018   Universal Protocol:    Date/Time: 11/21/198:29 AM  Consent Given By: the patient  Position: PRONE  Additional Comments: Vital signs were monitored before and after the procedure. Patient was prepped and draped in the usual sterile fashion. The correct patient, procedure, and site was verified.   Injection Procedure Details:  Procedure Site One Meds Administered:  Meds ordered this encounter  Medications  . methylPREDNISolone acetate (DEPO-MEDROL) injection 80 mg     Laterality: Right  Location/Site: C7-T1  Needle size: 20 G  Needle type: Touhy  Needle Placement: Paramedian epidural space  Findings:  -Comments: Excellent flow of contrast into the epidural space.  Procedure Details: Using a paramedian approach from the side mentioned above, the region overlying the inferior lamina was localized under fluoroscopic visualization and the soft tissues overlying this structure were infiltrated with 4 ml. of 1% Lidocaine without Epinephrine. A # 20 gauge, Tuohy needle was inserted into the epidural space using a paramedian approach.  The epidural space was localized using loss of resistance along with lateral and contralateral oblique bi-planar fluoroscopic views.  After negative aspirate for air, blood, and CSF, a 2 ml. volume of Isovue-250 was injected into the epidural space and the flow of contrast was observed. Radiographs were obtained for documentation purposes.   The injectate was administered into the level noted above.  Additional Comments:  No complications occurred Dressing: Band-Aid    Post-procedure details: Patient was observed during the procedure. Post-procedure instructions were reviewed.  Patient left the clinic in stable condition.

## 2019-01-09 ENCOUNTER — Encounter (INDEPENDENT_AMBULATORY_CARE_PROVIDER_SITE_OTHER): Payer: Self-pay | Admitting: Physical Medicine and Rehabilitation

## 2019-01-09 NOTE — Progress Notes (Signed)
Janet Marquez - 48 y.o. female MRN [id number]  Date of birth: 12-07-1970  Office Visit Note: Visit Date: 07/18/2018 PCP: Patient, No Pcp Per Referred by: No ref. provider found  Subjective: Chief Complaint  Patient presents with   Neck - Pain   Right Shoulder - Pain   Right Arm - Pain   Right Hand - Numbness   HPI: Janet Marquez is a 48 y.o. female who comes in today At the request of Shanon Rosser, PA-C from [facility] urgent care for evaluation management of chronic worsening cervical radiculopathy and neck pain.  Patient is well-known to [place] we have not seen her in 3 years and she had a prior cervical radiculitis that was managed in our office through Dr. Durward Fortes and ultimately an injection in our office that did extremely well.  She comes in today with right-sided pain with numbness and tingling into the index middle and ring finger and somewhat of a C6 distribution or possibly median nerve distribution.  She has had prior electrodiagnostic study remotely that did not show much in the way of median neuropathy at the time.  She has had cervical spine MRI in 2017 showing disc osteophyte complex at C5-6 with right-sided foraminal narrowing that does fit with a C6 radiculitis radiculopathy distribution.  She reports pain started in August 2019 and she is progressively gotten worse despite medication treatment and activity modification.  She had physical therapy in the past but not recently.  She describes this as a constant sharp dull and tingling type pain worse with activity better at rest and with heat and ice and using icy hot.  It does limit her daily activities to a degree.  Her average pain is 7 out of 10.  Her case is complicated by diabetes.  She has not had prior cervical surgery.  She has had no new trauma.  She has not had any red flag complaints of weakness or bowel or bladder changes headache or vision changes.  She denies any left-sided complaints.  Review of Systems    Constitutional: Negative for chills, fever, malaise/fatigue and weight loss.  HENT: Negative for hearing loss and sinus pain.   Eyes: Negative for blurred vision, double vision and photophobia.  Respiratory: Negative for cough and shortness of breath.   Cardiovascular: Negative for chest pain, palpitations and leg swelling.  Gastrointestinal: Negative for abdominal pain, nausea and vomiting.  Genitourinary: Negative for flank pain.  Musculoskeletal: Positive for neck pain. Negative for myalgias.       Right radicular arm pain  Skin: Negative for itching and rash.  Neurological: Positive for tingling. Negative for tremors, focal weakness and weakness.  Endo/Heme/Allergies: Negative.   Psychiatric/Behavioral: Negative for depression.  All other systems reviewed and are negative.  Otherwise per HPI.  Assessment & Plan: Visit Diagnoses:  1. Cervical radiculopathy   2. Paresthesia of skin   3. Cervicalgia   4. Pre-operative anxiety     Plan: Findings:  Chronic history of neck pain and right radicular arm pain in a C6 distribution in the past that was relieved significantly with cervical epidural injection.  MRI findings from the index showed with disc osteophyte complex with foraminal narrowing.  I think what she is having now is exacerbation of the radiculitis radiculopathy at this level.  She will probably have periods of this does flareup from time to time.  She does need an epidural injection to see if that will help diagnostically and therapeutically.  Depending on  relief could consider updating MRI for just not very helpful.  She could consider cervical ACDF which would probably permanently fix the situation but she is really not have any flareups.  Case complicated by diabetes.  She does have some level of anxiety with injection so we will provide Valium preprocedure.  Otherwise we talked about activity modification gentle stretching.  We could regroup with trigger point management which  there are some on exam.    Meds & Orders:  Meds ordered this encounter  Medications   DISCONTD: diazepam (VALIUM) 5 MG tablet    Sig: Take 1 by mouth 1 hour  pre-procedure with very light food. May bring 2nd tablet to appointment.    Dispense:  2 tablet    Refill:  0   No orders of the defined types were placed in this encounter.   Follow-up: Return for Right C7-T1 interlaminar epidural steroid injection with oral sedation..   Procedures: No procedures performed  No notes on file   Clinical History: MRI CERVICAL SPINE WITHOUT CONTRAST  TECHNIQUE: Multiplanar, multisequence MR imaging of the cervical spine was performed. No intravenous contrast was administered.  COMPARISON: Prior radiograph from 06/26/2016.  FINDINGS: Alignment: Straightening of the normal cervical lordosis. No listhesis.  Vertebrae: Vertebral body heights maintained. No evidence for acute or chronic fracture. Benign hemangioma noted within the T2 vertebral body. Signal intensity within the vertebral body bone marrow is normal.  Cord: Signal intensity within the cervical spinal cord is normal.  Posterior Fossa, vertebral arteries, paraspinal tissues: Visualized portions of the brain and posterior fossa are within normal limits. Craniocervical junction normal. Paraspinous and prevertebral soft tissues demonstrate no acute abnormality. Normal intravascular flow voids present within the vertebral arteries bilaterally.  Disc levels:  C2-C3: Negative.  C3-C4: Negative.  C4-C5: Minimal annular disc bulge with bilateral uncovertebral hypertrophy. No significant stenosis.  C5-C6: Mild diffuse degenerative disc bulge. Superimposed right uncovertebral hypertrophy. Resultant right foraminal disc osteophyte complex results in moderate right foraminal stenosis (series 7, image 14). No significant left foraminal narrowing. Central canal remains widely patent.  C6-C7: Negative.  C7-T1:  Negative.  The visualized portions of the upper thoracic spine are unremarkable. The  IMPRESSION: 1. Right foraminal disc osteophyte complex at C5-6 no with resultant moderate right foraminal narrowing. This could potentially result in right upper extremity radicular symptoms. 2. Minimal disc bulge at C4-5 without stenosis. 3. No other significant degenerative changes within the cervical spine. No canal stenosis.   Electronically Signed By: Jeannine Boga M.D. On: 07/05/2016 06:20  06/05/2013 EMG/NCS Impression: Essentially NORMAL electrodiagnostic study of.  There is no significant electrodiagnostic evidence of nerve entrapment, brachial plexopathy or cervical radiculopathy.    As you know, purely sensory or demyelinating radiculopathies and chemical radiculitis may not be detected with this particular electrodiagnostic study.   She reports that she has never smoked. She has never used smokeless tobacco. No results for input(s): HGBA1C, LABURIC in the last 8760 hours.  Objective:  VS:  HT:5\' 6"  (167.6 cm)    WT:205 lb (93 kg)   BMI:33.1     BP:(!) 155/95   HR:(!) 108bpm   TEMP: ( )   RESP:  Physical Exam Vitals signs and nursing note reviewed.  Constitutional:      General: She is not in acute distress.    Appearance: Normal appearance. She is well-developed.  HENT:     Head: Normocephalic and atraumatic.     Nose: Nose normal.     Mouth/Throat:  Mouth: Mucous membranes are moist.     Pharynx: Oropharynx is clear.  Eyes:     Conjunctiva/sclera: Conjunctivae normal.     Pupils: Pupils are equal, round, and reactive to light.  Neck:     Musculoskeletal: Normal range of motion and neck supple. Muscular tenderness present.  Cardiovascular:     Rate and Rhythm: Regular rhythm.  Pulmonary:     Effort: Pulmonary effort is normal. No respiratory distress.  Abdominal:     General: There is no distension.     Palpations: Abdomen is soft.     Tenderness: There is no  guarding.  Musculoskeletal:     Right lower leg: No edema.     Left lower leg: No edema.     Comments: Patient has somewhat forward flexed cervical spine she does have pain at end ranges of rotation but pretty good rotation bilaterally.  She has more pain with forward flexion and negative Spurling's test bilaterally.  She has some reproduction of symptoms with right sided lateral flexion and extension.  She has some trigger points in the trapezius bilaterally and levator scapula that reproduce some of the pain.  She has no real shoulder impingement signs.  She has good strength bilaterally.  She has decreased sensation subjectively or paresthesias in a C6 distribution.  She has a negative Tinel's and negative Phalen's test bilaterally.  She has a negative Hoffmann's test.  She has slightly diminished reflexes at the brachioradialis on the right more than left.  Lymphadenopathy:     Cervical: No cervical adenopathy.  Skin:    General: Skin is warm and dry.     Findings: No erythema or rash.  Neurological:     General: No focal deficit present.     Mental Status: She is alert and oriented to person, place, and time.     Motor: No abnormal muscle tone.     Coordination: Coordination normal.     Gait: Gait normal.  Psychiatric:        Mood and Affect: Mood normal.        Behavior: Behavior normal.        Thought Content: Thought content normal.     Ortho Exam Imaging: No results found.  Past Medical/Family/Surgical/Social History: Medications & Allergies reviewed per EMR, new medications updated. Patient Active Problem List   Diagnosis Date Noted   Costochondritis 01/14/2017   Fibroids 01/20/2015   Sinus tachycardia 11/23/2014   Chest pain 11/22/2014   Hypothyroidism 12/14/2009   DM2 (diabetes mellitus, type 2) ([place]) 12/14/2009   Anemia 12/14/2009   Essential hypertension 12/14/2009   GERD 12/14/2009   Past Medical History:  Diagnosis Date   Anemia    Hx of  cardiovascular stress test    ETT-echo 3/16: Normal   Hypertension    Thyroid disease    Family History  Problem Relation Age of Onset   Hypertension Maternal Grandmother    Stroke Maternal Grandmother    Heart attack Maternal Grandfather    Past Surgical History:  Procedure Laterality Date   BILATERAL SALPINGECTOMY Left 01/20/2015   Procedure: LAPAROSCOPIC LEFT SALPINGECTOMY;  Surgeon: Everlene Farrier, MD;  Location: [facility];  Service: Gynecology;  Laterality: Left;   CESAREAN SECTION     LAPAROSCOPIC ASSISTED VAGINAL HYSTERECTOMY N/A 01/20/2015   Procedure: LAPAROSCOPIC ASSISTED VAGINAL HYSTERECTOMY;  Surgeon: Everlene Farrier, MD;  Location: [facility];  Service: Gynecology;  Laterality: N/A;   MYOMECTOMY     2002   Social History   Occupational  History   Not on file  Tobacco Use   Smoking status: Never Smoker   Smokeless tobacco: Never Used  Substance and Sexual Activity   Alcohol use: No    Alcohol/week: 0.0 standard drinks    Comment: socially   Drug use: No   Sexual activity: Not on file

## 2019-08-31 ENCOUNTER — Other Ambulatory Visit: Payer: Self-pay | Admitting: Physician Assistant

## 2019-08-31 DIAGNOSIS — M542 Cervicalgia: Secondary | ICD-10-CM

## 2019-11-11 DIAGNOSIS — R7989 Other specified abnormal findings of blood chemistry: Secondary | ICD-10-CM | POA: Insufficient documentation

## 2020-06-13 ENCOUNTER — Other Ambulatory Visit: Payer: Self-pay

## 2020-06-13 ENCOUNTER — Encounter (HOSPITAL_COMMUNITY): Payer: Self-pay | Admitting: Emergency Medicine

## 2020-06-13 ENCOUNTER — Emergency Department (HOSPITAL_COMMUNITY)
Admission: EM | Admit: 2020-06-13 | Discharge: 2020-06-13 | Disposition: A | Discharge status: Home / Self Care | Payer: 59 | Attending: Emergency Medicine | Admitting: Emergency Medicine

## 2020-06-13 DIAGNOSIS — F419 Anxiety disorder, unspecified: Secondary | ICD-10-CM | POA: Diagnosis not present

## 2020-06-13 DIAGNOSIS — I10 Essential (primary) hypertension: Secondary | ICD-10-CM | POA: Insufficient documentation

## 2020-06-13 DIAGNOSIS — E119 Type 2 diabetes mellitus without complications: Secondary | ICD-10-CM | POA: Diagnosis not present

## 2020-06-13 DIAGNOSIS — E039 Hypothyroidism, unspecified: Secondary | ICD-10-CM | POA: Insufficient documentation

## 2020-06-13 DIAGNOSIS — Z7984 Long term (current) use of oral hypoglycemic drugs: Secondary | ICD-10-CM | POA: Insufficient documentation

## 2020-06-13 DIAGNOSIS — Z79899 Other long term (current) drug therapy: Secondary | ICD-10-CM | POA: Insufficient documentation

## 2020-06-13 MED ORDER — ALPRAZOLAM 0.25 MG PO TABS
0.2500 mg | ORAL_TABLET | Freq: Once | ORAL | Status: AC
  Administered 2020-06-13: 0.25 mg via ORAL
  Filled 2020-06-13: qty 1

## 2020-06-13 MED ORDER — ALPRAZOLAM 0.25 MG PO TABS: 0.2500 mg | ORAL_TABLET | Freq: Three times a day (TID) | ORAL | 0 refills | Status: DC | PRN

## 2020-06-13 NOTE — ED Notes (Signed)
Dr Zenia Resides able to get prescription sent to [facility] per patient request. Made Patient aware

## 2020-06-13 NOTE — ED Notes (Signed)
Pt requesting her prescription be sent to [facility] on [address] and [address]. Made Dr Zenia Resides aware. Working on getting location changed in the system.

## 2020-06-13 NOTE — ED Triage Notes (Addendum)
Per [facility] pt from home for anxious, crying, lightheaded since Friday.reports her dose of weekly medication was increased two weeks ago and everytime she takes it she doesn't like the way she feels. States vomited Friday and Saturday, Sunday felt better. But today having panic attacks that she hasnt had in 5 months so Called her PCP today to be seen but was not able to get in to be seen which flared up in panic attack.  Vitals: 20R, HR 140 decreased to 108bpm, 158/86.

## 2020-06-13 NOTE — ED Provider Notes (Signed)
Janet Marquez Note   CSN: [id number] Arrival date & time: 06/13/20  5701     History Chief Complaint  Patient presents with  . Anxiety    Janet Marquez is a 49 y.o. female.  49 year old female presents with increased anxiety.  Patient states that she had anxiety attack after receiving an injection several days ago.  Denies any SI or HI.  Has been treated for depression in the past as well but has not been medicated at this time.  Does not take any medication for anxiety currently.  The injection was for her diabetes treatment.  She had emesis and dizziness yesterday but none today.  Can keep down liquids.        Past Medical History:  Diagnosis Date  . Anemia   . Hx of cardiovascular stress test    ETT-echo 3/16: Normal  . Hypertension   . Thyroid disease     Patient Active Problem List   Diagnosis Date Noted  . Costochondritis 01/14/2017  . Fibroids 01/20/2015  . Sinus tachycardia 11/23/2014  . Chest pain 11/22/2014  . Hypothyroidism 12/14/2009  . DM2 (diabetes mellitus, type 2) ([place]) 12/14/2009  . Anemia 12/14/2009  . Essential hypertension 12/14/2009  . GERD 12/14/2009    Past Surgical History:  Procedure Laterality Date  . BILATERAL SALPINGECTOMY Left 01/20/2015   Procedure: LAPAROSCOPIC LEFT SALPINGECTOMY;  Surgeon: Everlene Farrier, MD;  Location: [facility];  Service: Gynecology;  Laterality: Left;  . CESAREAN SECTION    . LAPAROSCOPIC ASSISTED VAGINAL HYSTERECTOMY N/A 01/20/2015   Procedure: LAPAROSCOPIC ASSISTED VAGINAL HYSTERECTOMY;  Surgeon: Everlene Farrier, MD;  Location: [facility];  Service: Gynecology;  Laterality: N/A;  . MYOMECTOMY     2002     OB History   No obstetric history on file.     Family History  Problem Relation Age of Onset  . Hypertension Maternal Grandmother   . Stroke Maternal Grandmother   . Heart attack Maternal Grandfather     Social History   Tobacco Use  . Smoking status: Never  Smoker  . Smokeless tobacco: Never Used  Substance Use Topics  . Alcohol use: No    Alcohol/week: 0.0 standard drinks    Comment: socially  . Drug use: No    Home Medications Prior to Admission medications   Medication Sig Start Date End Date Taking? Authorizing Marquez  carboxymethylcellulose 1 % ophthalmic solution Place 1 drop into both eyes 3 (three) times daily.     Marquez, Historical, MD  metFORMIN (GLUCOPHAGE) 500 MG tablet Take 1 tablet (500 mg total) by mouth 2 (two) times daily with a meal. 03/28/15   Elby Beck, FNP  methocarbamol (ROBAXIN) 750 MG tablet TK 1 T PO TID 06/27/18   Marquez, Historical, MD  metoprolol tartrate (LOPRESSOR) 100 MG tablet TK 1 T PO BID 07/01/18   Marquez, Historical, MD  PARoxetine (PAXIL) 30 MG tablet Take 30 mg by mouth daily. 06/29/18   Marquez, Historical, MD  triazolam (HALCION) 0.25 MG tablet Take 1 tab by mouth 1 hour prior to procedure with very light food, do not drive motor vehicle. 08/29/18   Magnus Sinning, MD  zolpidem (AMBIEN) 5 MG tablet TK 1 T PO QD PRN FOR 30 DAYS 07/01/18   Marquez, Historical, MD    Allergies    Clarithromycin  Review of Systems   Review of Systems  All other systems reviewed and are negative.   Physical Exam Updated Vital Signs BP Marland Kitchen)  140/100 (BP Location: Right Arm)   Pulse (!) 110   Temp 98.8 F (37.1 C) (Oral)   Resp (!) 22   LMP 11/21/2014   SpO2 100%   Physical Exam Vitals and nursing note reviewed.  Constitutional:      General: She is not in acute distress.    Appearance: Normal appearance. She is well-developed. She is not toxic-appearing.  HENT:     Head: Normocephalic and atraumatic.  Eyes:     General: Lids are normal.     Conjunctiva/sclera: Conjunctivae normal.     Pupils: Pupils are equal, round, and reactive to light.  Neck:     Thyroid: No thyroid mass.     Trachea: No tracheal deviation.  Cardiovascular:     Rate and Rhythm: Normal rate and regular rhythm.      Heart sounds: Normal heart sounds. No murmur heard.  No gallop.   Pulmonary:     Effort: Pulmonary effort is normal. No respiratory distress.     Breath sounds: Normal breath sounds. No stridor. No decreased breath sounds, wheezing, rhonchi or rales.  Abdominal:     General: Bowel sounds are normal. There is no distension.     Palpations: Abdomen is soft.     Tenderness: There is no abdominal tenderness. There is no rebound.  Musculoskeletal:        General: No tenderness. Normal range of motion.     Cervical back: Normal range of motion and neck supple.  Skin:    General: Skin is warm and dry.     Findings: No abrasion or rash.  Neurological:     Mental Status: She is alert and oriented to person, place, and time.     GCS: GCS eye subscore is 4. GCS verbal subscore is 5. GCS motor subscore is 6.     Cranial Nerves: No cranial nerve deficit.     Sensory: No sensory deficit.  Psychiatric:        Attention and Perception: Attention normal.        Mood and Affect: Mood is anxious.        Speech: Speech normal.        Behavior: Behavior is withdrawn.        Thought Content: Thought content does not include homicidal or suicidal ideation. Thought content does not include homicidal or suicidal plan.     ED Results / Procedures / Treatments   Labs (all labs ordered are listed, but only abnormal results are displayed) Labs Reviewed - No data to display  EKG None  Radiology No results found.  Procedures Procedures (including critical care time)  Medications Ordered in ED Medications  ALPRAZolam (XANAX) tablet 0.25 mg (has no administration in time range)    ED Course  I have reviewed the triage vital signs and the nursing notes.  Pertinent labs & imaging results that were available during my care of the patient were reviewed by me and considered in my medical decision making (see chart for details).    MDM Rules/Calculators/A&P                          Xanax oral given  and patient will be started on same.  Will be discharged Final Clinical Impression(s) / ED Diagnoses Final diagnoses:  None    Rx / DC Orders ED Discharge Orders    None       [name], MD 06/13/20 1046

## 2020-09-04 ENCOUNTER — Other Ambulatory Visit: Payer: Self-pay

## 2020-09-04 ENCOUNTER — Emergency Department (HOSPITAL_COMMUNITY): Payer: 59

## 2020-09-04 ENCOUNTER — Encounter (HOSPITAL_COMMUNITY): Payer: Self-pay | Admitting: *Deleted

## 2020-09-04 ENCOUNTER — Emergency Department (HOSPITAL_COMMUNITY)
Admission: EM | Admit: 2020-09-04 | Discharge: 2020-09-04 | Disposition: A | Discharge status: Home / Self Care | Payer: 59 | Attending: Emergency Medicine | Admitting: Emergency Medicine

## 2020-09-04 DIAGNOSIS — Z79899 Other long term (current) drug therapy: Secondary | ICD-10-CM | POA: Diagnosis not present

## 2020-09-04 DIAGNOSIS — I10 Essential (primary) hypertension: Secondary | ICD-10-CM | POA: Diagnosis not present

## 2020-09-04 DIAGNOSIS — T383X5A Adverse effect of insulin and oral hypoglycemic [antidiabetic] drugs, initial encounter: Secondary | ICD-10-CM | POA: Insufficient documentation

## 2020-09-04 DIAGNOSIS — R55 Syncope and collapse: Secondary | ICD-10-CM | POA: Insufficient documentation

## 2020-09-04 DIAGNOSIS — E039 Hypothyroidism, unspecified: Secondary | ICD-10-CM | POA: Diagnosis not present

## 2020-09-04 DIAGNOSIS — Y9301 Activity, walking, marching and hiking: Secondary | ICD-10-CM | POA: Diagnosis not present

## 2020-09-04 DIAGNOSIS — T887XXA Unspecified adverse effect of drug or medicament, initial encounter: Secondary | ICD-10-CM | POA: Diagnosis not present

## 2020-09-04 DIAGNOSIS — S0181XA Laceration without foreign body of other part of head, initial encounter: Secondary | ICD-10-CM | POA: Insufficient documentation

## 2020-09-04 DIAGNOSIS — S0990XA Unspecified injury of head, initial encounter: Secondary | ICD-10-CM | POA: Diagnosis present

## 2020-09-04 DIAGNOSIS — Z7984 Long term (current) use of oral hypoglycemic drugs: Secondary | ICD-10-CM | POA: Diagnosis not present

## 2020-09-04 DIAGNOSIS — W01198A Fall on same level from slipping, tripping and stumbling with subsequent striking against other object, initial encounter: Secondary | ICD-10-CM | POA: Insufficient documentation

## 2020-09-04 DIAGNOSIS — E119 Type 2 diabetes mellitus without complications: Secondary | ICD-10-CM | POA: Diagnosis not present

## 2020-09-04 LAB — CBC WITH DIFFERENTIAL/PLATELET
Abs Immature Granulocytes: 0.03 10*3/uL (ref 0.00–0.07)
Basophils Absolute: 0 10*3/uL (ref 0.0–0.1)
Basophils Relative: 0 %
Eosinophils Absolute: 0.1 10*3/uL (ref 0.0–0.5)
Eosinophils Relative: 1 %
HCT: 41.8 % (ref 36.0–46.0)
Hemoglobin: 13.5 g/dL (ref 12.0–15.0)
Immature Granulocytes: 0 %
Lymphocytes Relative: 21 %
Lymphs Abs: 2.3 10*3/uL (ref 0.7–4.0)
MCH: 27.1 pg (ref 26.0–34.0)
MCHC: 32.3 g/dL (ref 30.0–36.0)
MCV: 83.9 fL (ref 80.0–100.0)
Monocytes Absolute: 0.5 10*3/uL (ref 0.1–1.0)
Monocytes Relative: 5 %
Neutro Abs: 8.1 10*3/uL — ABNORMAL HIGH (ref 1.7–7.7)
Neutrophils Relative %: 73 %
Platelets: 389 10*3/uL (ref 150–400)
RBC: 4.98 MIL/uL (ref 3.87–5.11)
RDW: 12.8 % (ref 11.5–15.5)
WBC: 11 10*3/uL — ABNORMAL HIGH (ref 4.0–10.5)
nRBC: 0 % (ref 0.0–0.2)

## 2020-09-04 LAB — COMPREHENSIVE METABOLIC PANEL
ALT: 22 U/L (ref 0–44)
AST: 40 U/L (ref 15–41)
Albumin: 3.9 g/dL (ref 3.5–5.0)
Alkaline Phosphatase: 89 U/L (ref 38–126)
Anion gap: 14 (ref 5–15)
BUN: 8 mg/dL (ref 6–20)
CO2: 20 mmol/L — ABNORMAL LOW (ref 22–32)
Calcium: 9.6 mg/dL (ref 8.9–10.3)
Chloride: 102 mmol/L (ref 98–111)
Creatinine, Ser: 0.94 mg/dL (ref 0.44–1.00)
GFR, Estimated: >60 mL/min (ref >60)
Glucose, Bld: 124 mg/dL — ABNORMAL HIGH (ref 70–99)
Potassium: 5 mmol/L (ref 3.5–5.1)
Sodium: 136 mmol/L (ref 135–145)
Total Bilirubin: 1.1 mg/dL (ref 0.3–1.2)
Total Protein: 6.9 g/dL (ref 6.5–8.1)

## 2020-09-04 LAB — CBG MONITORING, ED: Glucose-Capillary: 125 mg/dL — ABNORMAL HIGH (ref 70–99)

## 2020-09-04 MED ORDER — LIDOCAINE-EPINEPHRINE-TETRACAINE (LET) SOLUTION
3.0000 mL | Freq: Once | NASAL | Status: DC
  Filled 2020-09-04: qty 3

## 2020-09-04 MED ORDER — LIDOCAINE-EPINEPHRINE-TETRACAINE (LET) TOPICAL GEL
3.0000 mL | Freq: Once | TOPICAL | Status: AC
  Administered 2020-09-04: 3 mL via TOPICAL
  Filled 2020-09-04: qty 3

## 2020-09-04 MED ORDER — ONDANSETRON 4 MG PO TBDP
4.0000 mg | ORAL_TABLET | Freq: Once | ORAL | Status: AC
  Administered 2020-09-04: 4 mg via ORAL
  Filled 2020-09-04: qty 1

## 2020-09-04 NOTE — Discharge Instructions (Addendum)
Please stop taking the diabetic medication as above, Drink plenty of fluids Ibuprofen or tylenol for pain The liquid stitch will come off in the next week - don't take it off before 7 days if possible ER for wrist pain, swelling, fever, dizziness or any other severe or worsening symptoms  Keep topical antibiotic ointment on the wound on the left side of your forehead  Follow-up with your doctor within the next 2 or 3 days for medication management

## 2020-09-04 NOTE — ED Notes (Signed)
PT transported to XRAY 

## 2020-09-04 NOTE — ED Notes (Signed)
Patient verbalizes understanding of discharge instructions. Opportunity for questioning and answers were provided. Armband removed by staff, pt discharged from ED and ambulated to lobby to return home with family.  

## 2020-09-04 NOTE — Care Management (Signed)
PCP assigned in patient instructions. Patient will need to call on Monday to make a post ED appointment and establish primary care.

## 2020-09-04 NOTE — ED Triage Notes (Signed)
The pt  Was getting out oof her bed to go to the br and she fell striking   Cart and she has one abrasion over her rt and lt eyes/  This occurred around 0500am  She has started taking a new med for 2-3 days and she has felt a little lightheaded since then   ?? loc

## 2020-09-04 NOTE — ED Provider Notes (Signed)
[facility] EMERGENCY DEPARTMENT Provider Note   CSN: [id number] Arrival date & time: 09/04/20  7989     History Chief Complaint  Patient presents with  . Fall    Janet Marquez is a 49 y.o. female.  HPI   This patient is a very pleasant 49 year old female, she has a known history of diabetes and has been on multiple different medications over time most recently started on a new medication after ozempic gave her side effects, she has been on this new medication this week but has felt some dizziness, last night she had a fall where she was walking, was dizzy, fell to the ground striking her head suffering a laceration to her right forehead just right of midline as well as her left forehead above the left brow, there was some bleeding, she does not remember the fall, when she came around on the ground she was feeling dizzy still.  She did also strike her left knee during this fall.  She does not have a headache, she does not have neck pain, she has no chest pain or shortness of breath, no abdominal pain, no nausea vomiting or diarrhea, no rectal bleeding, no dysuria, no swelling or rashes.  She reports that her symptoms correlate with starting this new medication.  She has not taken her blood sugar at home to see if it is low or high.  She has been able to eat and drink per normal.  She treated this prehospital with placing a Band-Aid over the wound  Past Medical History:  Diagnosis Date  . Anemia   . Hx of cardiovascular stress test    ETT-echo 3/16: Normal  . Hypertension   . Thyroid disease     Patient Active Problem List   Diagnosis Date Noted  . Costochondritis 01/14/2017  . Fibroids 01/20/2015  . Sinus tachycardia 11/23/2014  . Chest pain 11/22/2014  . Hypothyroidism 12/14/2009  . DM2 (diabetes mellitus, type 2) ([place]) 12/14/2009  . Anemia 12/14/2009  . Essential hypertension 12/14/2009  . GERD 12/14/2009    Past Surgical History:  Procedure  Laterality Date  . BILATERAL SALPINGECTOMY Left 01/20/2015   Procedure: LAPAROSCOPIC LEFT SALPINGECTOMY;  Surgeon: Everlene Farrier, MD;  Location: [facility];  Service: Gynecology;  Laterality: Left;  . CESAREAN SECTION    . LAPAROSCOPIC ASSISTED VAGINAL HYSTERECTOMY N/A 01/20/2015   Procedure: LAPAROSCOPIC ASSISTED VAGINAL HYSTERECTOMY;  Surgeon: Everlene Farrier, MD;  Location: [facility];  Service: Gynecology;  Laterality: N/A;  . MYOMECTOMY     2002     OB History   No obstetric history on file.     Family History  Problem Relation Age of Onset  . Hypertension Maternal Grandmother   . Stroke Maternal Grandmother   . Heart attack Maternal Grandfather     Social History   Tobacco Use  . Smoking status: Never Smoker  . Smokeless tobacco: Never Used  Substance Use Topics  . Alcohol use: No    Alcohol/week: 0.0 standard drinks    Comment: socially  . Drug use: No    Home Medications Prior to Admission medications   Medication Sig Start Date End Date Taking? Authorizing Provider  atorvastatin (LIPITOR) 20 MG tablet Take 20 mg by mouth daily. 07/08/20  Yes [provider]  carboxymethylcellulose 1 % ophthalmic solution Place 1 drop into both eyes 3 (three) times daily.    Yes [provider]  hydrochlorothiazide (HYDRODIURIL) 12.5 MG tablet Take 12.5 mg by mouth daily. 09/02/20  Yes [provider]  lisinopril (ZESTRIL) 5 MG tablet Take 5 mg by mouth daily. 08/16/20  Yes [provider]  metFORMIN (GLUCOPHAGE) 500 MG tablet Take 1 tablet (500 mg total) by mouth 2 (two) times daily with a meal. 03/28/15  Yes Elby Beck, FNP  metoprolol tartrate (LOPRESSOR) 100 MG tablet Take 100 mg by mouth 2 (two) times daily.  07/01/18  Yes [provider]  sertraline (ZOLOFT) 50 MG tablet Take 50 mg by mouth daily. 08/16/20  Yes [provider]  ALPRAZolam (XANAX) 0.25 MG tablet Take 1 tablet (0.25 mg total) by mouth 3 (three) times daily as needed  for anxiety. Patient not taking: Reported on 09/04/2020 06/13/20   [name], MD  triazolam (HALCION) 0.25 MG tablet Take 1 tab by mouth 1 hour prior to procedure with very light food, do not drive motor vehicle. Patient not taking: Reported on 09/04/2020 08/29/18   Magnus Sinning, MD    Allergies    Clarithromycin  Review of Systems   Review of Systems  All other systems reviewed and are negative.   Physical Exam Updated Vital Signs BP 114/88   Pulse 85   Temp 98.6 F (37 C)   Resp 20   Ht 1.676 m (5\' 6" )   Wt 93 kg   LMP 11/21/2014   SpO2 99%   BMI 33.09 kg/m   Physical Exam Vitals and nursing note reviewed.  Constitutional:      General: She is not in acute distress.    Appearance: She is well-developed.  HENT:     Head: Normocephalic.     Comments: There is a laceration to the right forehead and a small puncture laceration to the left forehead, minimal if any surrounding swelling, no malocclusion, no hemotympanum, no raccoon eyes or battle sign    Nose: Nose normal.     Mouth/Throat:     Mouth: Mucous membranes are moist.     Pharynx: Oropharynx is clear. No oropharyngeal exudate.  Eyes:     General: No scleral icterus.       Right eye: No discharge.        Left eye: No discharge.     Conjunctiva/sclera: Conjunctivae normal.     Pupils: Pupils are equal, round, and reactive to light.  Neck:     Thyroid: No thyromegaly.     Vascular: No JVD.     Comments: Full range of motion of the neck without any tenderness Cardiovascular:     Rate and Rhythm: Normal rate and regular rhythm.     Heart sounds: Normal heart sounds. No murmur heard.  No friction rub. No gallop.   Pulmonary:     Effort: Pulmonary effort is normal. No respiratory distress.     Breath sounds: Normal breath sounds. No wheezing or rales.  Abdominal:     General: Bowel sounds are normal. There is no distension.     Palpations: Abdomen is soft. There is no mass.     Tenderness: There is  no abdominal tenderness.  Musculoskeletal:        General: Tenderness present. Normal range of motion.     Cervical back: Normal range of motion and neck supple.     Comments: The patient has normal range of motion of all 4 extremities with supple joints and soft compartments diffusely.  The left knee does have some area of contusion over the patella, normal range of motion but with some tenderness, she is able to straight  leg raise  Lymphadenopathy:     Cervical: No cervical adenopathy.  Skin:    General: Skin is warm and dry.     Findings: No erythema or rash.     Comments: Laceration as noted  Neurological:     Mental Status: She is alert.     Coordination: Coordination normal.     Comments: Awake alert and able to ambulate and follow commands without difficulty.  She has normal mental status and normal memory except for the event surrounding the fall  Psychiatric:        Behavior: Behavior normal.     ED Results / Procedures / Treatments   Labs (all labs ordered are listed, but only abnormal results are displayed) Labs Reviewed  COMPREHENSIVE METABOLIC PANEL - Abnormal; Notable for the following components:      Result Value   CO2 20 (*)    Glucose, Bld 124 (*)    All other components within normal limits  CBC WITH DIFFERENTIAL/PLATELET - Abnormal; Notable for the following components:   WBC 11.0 (*)    Neutro Abs 8.1 (*)    All other components within normal limits  CBG MONITORING, ED - Abnormal; Notable for the following components:   Glucose-Capillary 125 (*)    All other components within normal limits    EKG EKG Interpretation  Date/Time:  Sunday September 04 2020 08:01:50 EST Ventricular Rate:  91 PR Interval:    QRS Duration: 88 QT Interval:  364 QTC Calculation: 448 R Axis:   46 Text Interpretation: Sinus rhythm Borderline T wave abnormalities Confirmed by Noemi Chapel [phone]) on 09/04/2020 9:10:18 AM   Radiology CT Head Wo Contrast  Result Date:  09/04/2020 CLINICAL DATA:  49 year old female with syncope fall and head injury today. Initial encounter. EXAM: CT HEAD WITHOUT CONTRAST TECHNIQUE: Contiguous axial images were obtained from the base of the skull through the vertex without intravenous contrast. COMPARISON:  None. FINDINGS: Brain: No evidence of acute infarction, hemorrhage, hydrocephalus, extra-axial collection or mass lesion/mass effect. Vascular: No hyperdense vessel or unexpected calcification. Skull: Normal. Negative for fracture or focal lesion. Sinuses/Orbits: No acute finding. Other: Mild RIGHT forehead soft tissue swelling noted. IMPRESSION: 1. No evidence of acute intracranial abnormality. 2. Mild RIGHT forehead soft tissue swelling without fracture. Electronically Signed   By: Margarette Canada M.D.   On: 09/04/2020 09:05   DG Knee Complete 4 Views Left  Result Date: 09/04/2020 CLINICAL DATA:  Fall this morning with left knee pain. EXAM: LEFT KNEE - COMPLETE 4+ VIEW COMPARISON:  None. FINDINGS: Subtle early degenerative changes. No acute fracture or dislocation. No significant joint effusion. IMPRESSION: No acute findings. Electronically Signed   By: Marin Olp M.D.   On: 09/04/2020 08:34    Procedures .Marland KitchenLaceration Repair  Date/Time: 09/04/2020 9:31 AM Performed by: Noemi Chapel, MD Authorized by: Noemi Chapel, MD   Consent:    Consent obtained:  Verbal   Consent given by:  Patient   Risks discussed:  Infection, pain, need for additional repair, poor cosmetic result and poor wound healing   Alternatives discussed:  No treatment and delayed treatment Anesthesia (see MAR for exact dosages):    Anesthesia method:  Topical application   Topical anesthetic:  LET Laceration details:    Location:  Face   Face location:  Forehead   Length (cm):  2   Depth (mm):  3 Repair type:    Repair type:  Simple Pre-procedure details:    Preparation:  Patient was prepped  and draped in usual sterile fashion and imaging obtained  to evaluate for foreign bodies Exploration:    Hemostasis achieved with:  Direct pressure   Wound exploration: wound explored through full range of motion and entire depth of wound probed and visualized     Wound extent: no fascia violation noted, no foreign bodies/material noted, no muscle damage noted, no nerve damage noted, no tendon damage noted, no underlying fracture noted and no vascular damage noted     Contaminated: no   Treatment:    Area cleansed with:  Betadine   Amount of cleaning:  Standard   Irrigation solution:  Sterile saline   Irrigation method:  Syringe Skin repair:    Repair method:  Tissue adhesive Approximation:    Approximation:  Close Post-procedure details:    Dressing:  Sterile dressing   Patient tolerance of procedure:  Tolerated well, no immediate complications Comments:         (including critical care time)  Medications Ordered in ED Medications  lidocaine-EPINEPHrine-tetracaine (LET) topical gel (3 mLs Topical Given 09/04/20 0823)  ondansetron (ZOFRAN-ODT) disintegrating tablet 4 mg (4 mg Oral Given 09/04/20 9702)    ED Course  I have reviewed the triage vital signs and the nursing notes.  Pertinent labs & imaging results that were available during my care of the patient were reviewed by me and considered in my medical decision making (see chart for details).    MDM Rules/Calculators/A&P                          Due to dizziness with head injury will obtain a CT scan to make sure she is not suffered an intracranial injury, she has no neck pain or neurologic symptoms to suggest the need for cervical spine imaging.  Additionally with starting a new diabetic medication we will check a metabolic panel to make sure she does not have any electrolyte abnormalities glucose abnormalities or renal dysfunction.  Labs show normal lytes, normal Cr, and glucose of 124 CT negative, Plain films of knee negative I personally viewed and interpreted the imaging  and agree with radiologist interpretation, wound fixed, pt stable for d/c - will discontinue the diabetic medicine she has recently started.    Final Clinical Impression(s) / ED Diagnoses Final diagnoses:  Syncope, unspecified syncope type  Medication side effect  Facial laceration, initial encounter    Rx / DC Orders ED Discharge Orders    None       Noemi Chapel, MD 09/04/20 [phone]

## 2020-12-28 ENCOUNTER — Encounter (HOSPITAL_COMMUNITY): Payer: Self-pay

## 2020-12-28 ENCOUNTER — Other Ambulatory Visit: Payer: Self-pay

## 2020-12-28 ENCOUNTER — Emergency Department (HOSPITAL_COMMUNITY)
Admission: EM | Admit: 2020-12-28 | Discharge: 2020-12-28 | Disposition: A | Discharge status: Home / Self Care | Payer: 59 | Attending: Emergency Medicine | Admitting: Emergency Medicine

## 2020-12-28 DIAGNOSIS — J320 Chronic maxillary sinusitis: Secondary | ICD-10-CM | POA: Insufficient documentation

## 2020-12-28 DIAGNOSIS — R079 Chest pain, unspecified: Secondary | ICD-10-CM | POA: Diagnosis not present

## 2020-12-28 DIAGNOSIS — Z79899 Other long term (current) drug therapy: Secondary | ICD-10-CM | POA: Diagnosis not present

## 2020-12-28 DIAGNOSIS — Z7984 Long term (current) use of oral hypoglycemic drugs: Secondary | ICD-10-CM | POA: Insufficient documentation

## 2020-12-28 DIAGNOSIS — E119 Type 2 diabetes mellitus without complications: Secondary | ICD-10-CM | POA: Insufficient documentation

## 2020-12-28 DIAGNOSIS — R0602 Shortness of breath: Secondary | ICD-10-CM | POA: Diagnosis present

## 2020-12-28 DIAGNOSIS — J321 Chronic frontal sinusitis: Secondary | ICD-10-CM | POA: Insufficient documentation

## 2020-12-28 DIAGNOSIS — E039 Hypothyroidism, unspecified: Secondary | ICD-10-CM | POA: Diagnosis not present

## 2020-12-28 DIAGNOSIS — F419 Anxiety disorder, unspecified: Secondary | ICD-10-CM | POA: Insufficient documentation

## 2020-12-28 DIAGNOSIS — I10 Essential (primary) hypertension: Secondary | ICD-10-CM | POA: Insufficient documentation

## 2020-12-28 DIAGNOSIS — F410 Panic disorder [episodic paroxysmal anxiety] without agoraphobia: Secondary | ICD-10-CM | POA: Diagnosis not present

## 2020-12-28 MED ORDER — ALPRAZOLAM 0.25 MG PO TABS
0.5000 mg | ORAL_TABLET | Freq: Once | ORAL | Status: AC
  Administered 2020-12-28: 0.5 mg via ORAL
  Filled 2020-12-28: qty 2

## 2020-12-28 MED ORDER — ALPRAZOLAM 0.25 MG PO TABS: 0.2500 mg | ORAL_TABLET | Freq: Three times a day (TID) | ORAL | 0 refills | Status: DC | PRN

## 2020-12-28 NOTE — ED Triage Notes (Signed)
Patient to ED for complaints of anxiety attack, states she woke up this morning and "felt like I couldn't breathe". States that she has PMH of anxiety, has not been taking medications, has been doing "talk therapy" with some success. States this is first anxiety attack since therapy. States she is currently having chest pain and SOB.

## 2020-12-28 NOTE — Discharge Instructions (Addendum)
Take Xanax up to 3 times a day, as needed for severe anxiety symptoms.   Follow up with your doctor for recheck and further management. Return to the emergency department with any new or worsening symptoms.

## 2020-12-28 NOTE — ED Provider Notes (Signed)
[facility] EMERGENCY DEPARTMENT Provider Note   CSN: [id number] Arrival date & time: 12/28/20  0154     History Chief Complaint  Patient presents with  . Panic Attack    Janet Marquez is a 50 y.o. female.  Patient with history of DM, HTN, thyroid disease, anemia presents for evaluation of symptoms of chest tightness and SOB. She got up to go to the bathroom this morning, felt hot/flushed followed by presenting complaints. No cough. She is currently being treated for a sinus infection that causes significant nasal congestion. She states she felt as if she couldn't breath secondary to nasal congestion, which she feels caused anxiety and panic. Her chest tightness and SOB are identical to previous panic. She was treated with Xanax last year but reports being in therapy and needing no medication since then.   The history is provided by the patient. No language interpreter was used.       Past Medical History:  Diagnosis Date  . Anemia   . Hx of cardiovascular stress test    ETT-echo 3/16: Normal  . Hypertension   . Thyroid disease     Patient Active Problem List   Diagnosis Date Noted  . Costochondritis 01/14/2017  . Fibroids 01/20/2015  . Sinus tachycardia 11/23/2014  . Chest pain 11/22/2014  . Hypothyroidism 12/14/2009  . DM2 (diabetes mellitus, type 2) ([place]) 12/14/2009  . Anemia 12/14/2009  . Essential hypertension 12/14/2009  . GERD 12/14/2009    Past Surgical History:  Procedure Laterality Date  . BILATERAL SALPINGECTOMY Left 01/20/2015   Procedure: LAPAROSCOPIC LEFT SALPINGECTOMY;  Surgeon: Everlene Farrier, MD;  Location: [facility];  Service: Gynecology;  Laterality: Left;  . CESAREAN SECTION    . LAPAROSCOPIC ASSISTED VAGINAL HYSTERECTOMY N/A 01/20/2015   Procedure: LAPAROSCOPIC ASSISTED VAGINAL HYSTERECTOMY;  Surgeon: Everlene Farrier, MD;  Location: [facility];  Service: Gynecology;  Laterality: N/A;  . MYOMECTOMY     2002     OB History   No  obstetric history on file.     Family History  Problem Relation Age of Onset  . Hypertension Maternal Grandmother   . Stroke Maternal Grandmother   . Heart attack Maternal Grandfather     Social History   Tobacco Use  . Smoking status: Never Smoker  . Smokeless tobacco: Never Used  Substance Use Topics  . Alcohol use: No    Alcohol/week: 0.0 standard drinks    Comment: socially  . Drug use: No    Home Medications Prior to Admission medications   Medication Sig Start Date End Date Taking? Authorizing Provider  ALPRAZolam (XANAX) 0.25 MG tablet Take 1 tablet (0.25 mg total) by mouth 3 (three) times daily as needed for anxiety. Patient not taking: Reported on 09/04/2020 06/13/20   [name], MD  atorvastatin (LIPITOR) 20 MG tablet Take 20 mg by mouth daily. 07/08/20   [provider]  carboxymethylcellulose 1 % ophthalmic solution Place 1 drop into both eyes 3 (three) times daily.     [provider]  hydrochlorothiazide (HYDRODIURIL) 12.5 MG tablet Take 12.5 mg by mouth daily. 09/02/20   [provider]  lisinopril (ZESTRIL) 5 MG tablet Take 5 mg by mouth daily. 08/16/20   [provider]  metFORMIN (GLUCOPHAGE) 500 MG tablet Take 1 tablet (500 mg total) by mouth 2 (two) times daily with a meal. 03/28/15   Elby Beck, FNP  metoprolol tartrate (LOPRESSOR) 100 MG tablet Take 100 mg by mouth 2 (two) times  daily.  07/01/18   [provider]  sertraline (ZOLOFT) 50 MG tablet Take 50 mg by mouth daily. 08/16/20   [provider]  triazolam (HALCION) 0.25 MG tablet Take 1 tab by mouth 1 hour prior to procedure with very light food, do not drive motor vehicle. Patient not taking: Reported on 09/04/2020 08/29/18   Magnus Sinning, MD    Allergies    Clarithromycin  Review of Systems   Review of Systems  Constitutional: Negative for chills and fever.  HENT: Positive for congestion, sinus pressure and sinus pain.    Respiratory: Positive for chest tightness and shortness of breath.   Cardiovascular: Negative.   Gastrointestinal: Negative.   Musculoskeletal: Negative.   Skin: Negative.   Neurological: Negative.   Psychiatric/Behavioral: The patient is nervous/anxious.     Physical Exam Updated Vital Signs BP (!) 181/105 (BP Location: Left Arm)   Pulse 85   Temp 97.7 F (36.5 C) (Oral)   Resp 20   Ht 5\' 6"  (1.676 m)   Wt 82.6 kg   LMP 11/21/2014   SpO2 100%   BMI 29.38 kg/m   Physical Exam Vitals and nursing note reviewed.  Constitutional:      Appearance: She is well-developed.  HENT:     Head: Normocephalic.     Nose:     Right Sinus: Maxillary sinus tenderness and frontal sinus tenderness present.     Left Sinus: Maxillary sinus tenderness and frontal sinus tenderness present.  Eyes:     Conjunctiva/sclera: Conjunctivae normal.  Cardiovascular:     Rate and Rhythm: Normal rate and regular rhythm.     Heart sounds: No murmur heard.   Pulmonary:     Effort: Pulmonary effort is normal.     Breath sounds: Normal breath sounds. No wheezing, rhonchi or rales.  Chest:     Chest wall: No tenderness.  Abdominal:     General: Bowel sounds are normal.     Palpations: Abdomen is soft.     Tenderness: There is no abdominal tenderness. There is no guarding or rebound.  Musculoskeletal:        General: Normal range of motion.     Cervical back: Normal range of motion and neck supple.  Skin:    General: Skin is warm and dry.     Findings: No rash.  Neurological:     Mental Status: She is alert and oriented to person, place, and time.     ED Results / Procedures / Treatments   Labs (all labs ordered are listed, but only abnormal results are displayed) Labs Reviewed - No data to display  EKG EKG Interpretation  Date/Time:  Wednesday December 28 2020 01:58:51 EDT Ventricular Rate:  78 PR Interval:    QRS Duration: 77 QT Interval:  383 QTC Calculation: 437 R Axis:   55 Text  Interpretation: Sinus rhythm Probable left atrial enlargement No significant change since last tracing Confirmed by Ripley Fraise [phone]) on 12/28/2020 2:10:14 AM   Radiology No results found.  Procedures Procedures   Medications Ordered in ED Medications  ALPRAZolam (XANAX) tablet 0.5 mg (has no administration in time range)    ED Course  I have reviewed the triage vital signs and the nursing notes.  Pertinent labs & imaging results that were available during my care of the patient were reviewed by me and considered in my medical decision making (see chart for details).    MDM Rules/Calculators/A&P  Patient to ED for ss/sxs she feels are anxiety/panic. History of same.   VSS. She states symptoms have slowing improved over the last 2 hours but still feels anxious. No history of CAD. No cough or fever to suggest new infection.   Xanax provided. Will reassess symptoms after medication. She has been seen and evaluated by Dr. Christy Gentles. No lab evaluation felt indicated with symptoms of anxiety that patient feels are identical to previous episodes.   On reassessment, the patient is feeling much better. Appears more relaxed. Felt appropriate for discharge home with PCP/therapist follow up.   Final Clinical Impression(s) / ED Diagnoses Final diagnoses:  None   1. Panic and anxiety  Rx / DC Orders ED Discharge Orders    None       Charlann Lange, PA-C 12/28/20 0346    Ripley Fraise, MD 12/28/20 [phone]

## 2020-12-28 NOTE — ED Notes (Signed)
Patient verbalizes understanding of discharge instructions. Opportunity for questioning and answers were provided. Armband removed by staff, pt discharged from ED ambulatory.   

## 2021-05-16 ENCOUNTER — Emergency Department (HOSPITAL_COMMUNITY): Payer: 59

## 2021-05-16 ENCOUNTER — Encounter (HOSPITAL_COMMUNITY): Payer: Self-pay | Admitting: Emergency Medicine

## 2021-05-16 ENCOUNTER — Ambulatory Visit (INDEPENDENT_AMBULATORY_CARE_PROVIDER_SITE_OTHER)
Admission: EM | Admit: 2021-05-16 | Discharge: 2021-05-16 | Disposition: A | Discharge status: Home / Self Care | Payer: 59 | Source: Home / Self Care

## 2021-05-16 ENCOUNTER — Other Ambulatory Visit: Payer: Self-pay

## 2021-05-16 ENCOUNTER — Emergency Department (HOSPITAL_COMMUNITY)
Admission: EM | Admit: 2021-05-16 | Discharge: 2021-05-16 | Disposition: A | Discharge status: Home / Self Care | Payer: 59 | Attending: Emergency Medicine | Admitting: Emergency Medicine

## 2021-05-16 DIAGNOSIS — Z7984 Long term (current) use of oral hypoglycemic drugs: Secondary | ICD-10-CM | POA: Insufficient documentation

## 2021-05-16 DIAGNOSIS — E039 Hypothyroidism, unspecified: Secondary | ICD-10-CM | POA: Diagnosis not present

## 2021-05-16 DIAGNOSIS — I10 Essential (primary) hypertension: Secondary | ICD-10-CM | POA: Insufficient documentation

## 2021-05-16 DIAGNOSIS — R42 Dizziness and giddiness: Secondary | ICD-10-CM | POA: Insufficient documentation

## 2021-05-16 DIAGNOSIS — E119 Type 2 diabetes mellitus without complications: Secondary | ICD-10-CM | POA: Insufficient documentation

## 2021-05-16 DIAGNOSIS — R519 Headache, unspecified: Secondary | ICD-10-CM | POA: Insufficient documentation

## 2021-05-16 DIAGNOSIS — Z79899 Other long term (current) drug therapy: Secondary | ICD-10-CM | POA: Insufficient documentation

## 2021-05-16 LAB — CBC WITH DIFFERENTIAL/PLATELET
Abs Immature Granulocytes: 0.03 10*3/uL (ref 0.00–0.07)
Basophils Absolute: 0 10*3/uL (ref 0.0–0.1)
Basophils Relative: 0 %
Eosinophils Absolute: 0.1 10*3/uL (ref 0.0–0.5)
Eosinophils Relative: 1 %
HCT: 39.6 % (ref 36.0–46.0)
Hemoglobin: 13.2 g/dL (ref 12.0–15.0)
Immature Granulocytes: 0 %
Lymphocytes Relative: 28 %
Lymphs Abs: 2.3 10*3/uL (ref 0.7–4.0)
MCH: 27.3 pg (ref 26.0–34.0)
MCHC: 33.3 g/dL (ref 30.0–36.0)
MCV: 82 fL (ref 80.0–100.0)
Monocytes Absolute: 0.4 10*3/uL (ref 0.1–1.0)
Monocytes Relative: 5 %
Neutro Abs: 5.5 10*3/uL (ref 1.7–7.7)
Neutrophils Relative %: 66 %
Platelets: 325 10*3/uL (ref 150–400)
RBC: 4.83 MIL/uL (ref 3.87–5.11)
RDW: 12.9 % (ref 11.5–15.5)
WBC: 8.3 10*3/uL (ref 4.0–10.5)
nRBC: 0 % (ref 0.0–0.2)

## 2021-05-16 LAB — COMPREHENSIVE METABOLIC PANEL
ALT: 59 U/L — ABNORMAL HIGH (ref 0–44)
AST: 69 U/L — ABNORMAL HIGH (ref 15–41)
Albumin: 4.4 g/dL (ref 3.5–5.0)
Alkaline Phosphatase: 113 U/L (ref 38–126)
Anion gap: 11 (ref 5–15)
BUN: 5 mg/dL — ABNORMAL LOW (ref 6–20)
CO2: 25 mmol/L (ref 22–32)
Calcium: 10.1 mg/dL (ref 8.9–10.3)
Chloride: 99 mmol/L (ref 98–111)
Creatinine, Ser: 0.8 mg/dL (ref 0.44–1.00)
GFR, Estimated: >60 mL/min (ref >60)
Glucose, Bld: 324 mg/dL — ABNORMAL HIGH (ref 70–99)
Potassium: 4.3 mmol/L (ref 3.5–5.1)
Sodium: 135 mmol/L (ref 135–145)
Total Bilirubin: 0.7 mg/dL (ref 0.3–1.2)
Total Protein: 7.7 g/dL (ref 6.5–8.1)

## 2021-05-16 LAB — CBG MONITORING, ED: Glucose-Capillary: 317 mg/dL — ABNORMAL HIGH (ref 70–99)

## 2021-05-16 MED ORDER — MECLIZINE HCL 25 MG PO TABS: 25.0000 mg | ORAL_TABLET | Freq: Three times a day (TID) | ORAL | 0 refills | Status: DC | PRN

## 2021-05-16 MED ORDER — KETOROLAC TROMETHAMINE 30 MG/ML IJ SOLN
30.0000 mg | Freq: Once | INTRAMUSCULAR | Status: AC
  Administered 2021-05-16: 30 mg via INTRAVENOUS
  Filled 2021-05-16: qty 1

## 2021-05-16 MED ORDER — SODIUM CHLORIDE 0.9 % IV BOLUS
1000.0000 mL | Freq: Once | INTRAVENOUS | Status: AC
  Administered 2021-05-16: 1000 mL via INTRAVENOUS

## 2021-05-16 MED ORDER — MECLIZINE HCL 25 MG PO TABS
25.0000 mg | ORAL_TABLET | Freq: Once | ORAL | Status: AC
  Administered 2021-05-16: 25 mg via ORAL
  Filled 2021-05-16: qty 1

## 2021-05-16 NOTE — ED Provider Notes (Signed)
Emergency Medicine Provider Triage Evaluation Note  Janet Marquez , a 50 y.o. female  was evaluated in triage.  Pt complains of left ear pain, left-sided headache and dizziness.  Headache has been intermittent for several months but has gotten worse in the past few days.  Started having left ear pain a few days ago.  Dizziness began yesterday and improved this morning.  Concerned that she has an ear infection.  Feels "swimmy headed."  Denies any vision changes or vomiting.  Review of Systems  Positive: Headache, dizziness, left ear pain Negative: Vision changes, vomiting  Physical Exam  BP (!) 172/101   Pulse 98   Temp 98.3 F (36.8 C) (Oral)   Resp 14   LMP 11/21/2014   SpO2 99%  Gen:   Awake, no distress   Resp:  Normal effort  MSK:   Moves extremities without difficulty  Other:  Strength 5/5 in bilateral upper and lower extremities.  Pupils are equal and reactive to light.  No facial asymmetry  Medical Decision Making  Medically screening exam initiated at 9:24 AM.  Appropriate orders placed.  Janet Marquez was informed that the remainder of the evaluation will be completed by another provider, this initial triage assessment does not replace that evaluation, and the importance of remaining in the ED until their evaluation is complete.  Imaging ordered and lab work ordered.   Delia Heady, PA-C 05/16/21 [id number]    Wyvonnia Dusky, MD 05/16/21 [phone]

## 2021-05-16 NOTE — ED Triage Notes (Signed)
Pt here from [facility] with c/o dizziness , thought she had an ear infection ,

## 2021-05-16 NOTE — Discharge Instructions (Addendum)
Please report to the emergency room now as you are in need of a higher level of care than we can provide in the urgent care setting. My primary concern is that you have an acute encephalopathy such as a TIA or stroke. You have a bad headache, dizziness, left ear pain with severely elevated blood pressures so I do not recommend going home. Please go straight to the emergency room.

## 2021-05-16 NOTE — Discharge Instructions (Addendum)
You have been seen and discharged from the emergency department.  I believe your dizziness is due to vertigo.  Take meclizine as directed.  Follow-up with ear nose and throat.  Take Tylenol/ibuprofen as needed for headache control.  Follow-up with your primary provider for reevaluation and further care. Take home medications as prescribed. If you have any worsening symptoms, worsening headache, worsening dizziness or further concerns for your health please return to an emergency department for further evaluation.

## 2021-05-16 NOTE — ED Provider Notes (Signed)
[place]   MRN: [id number] DOB: [birth year]  Subjective:   Janet Marquez is a 50 y.o. female presenting for 4-day history of acute onset persistent and worsening left ear pain, left-sided facial pain and head pain, photophobia, dizziness and vertigo.  Patient initially felt like the primary problem was in her ear but symptoms rapidly progressed to include the headache and her dizziness.  She had significant difficulty with her balance this morning and was stumbling, nearly fell.  Has used Tylenol without any relief.  She does have a history of essential hypertension, type 2 diabetes.  She is managed with medical therapy, not insulin.  Denies history of stroke, TIA.  No current facility-administered medications for this encounter.  Current Outpatient Medications:    ALPRAZolam (XANAX) 0.25 MG tablet, Take 1 tablet (0.25 mg total) by mouth 3 (three) times daily as needed for anxiety., Disp: 6 tablet, Rfl: 0   atorvastatin (LIPITOR) 20 MG tablet, Take 20 mg by mouth daily., Disp: , Rfl:    carboxymethylcellulose 1 % ophthalmic solution, Place 1 drop into both eyes 3 (three) times daily. , Disp: , Rfl:    hydrochlorothiazide (HYDRODIURIL) 12.5 MG tablet, Take 12.5 mg by mouth daily., Disp: , Rfl:    lisinopril (ZESTRIL) 5 MG tablet, Take 5 mg by mouth daily., Disp: , Rfl:    metFORMIN (GLUCOPHAGE) 500 MG tablet, Take 1 tablet (500 mg total) by mouth 2 (two) times daily with a meal., Disp: 60 tablet, Rfl: 3   metoprolol tartrate (LOPRESSOR) 100 MG tablet, Take 100 mg by mouth 2 (two) times daily. , Disp: , Rfl: 3   sertraline (ZOLOFT) 50 MG tablet, Take 50 mg by mouth daily., Disp: , Rfl:    triazolam (HALCION) 0.25 MG tablet, Take 1 tab by mouth 1 hour prior to procedure with very light food, do not drive motor vehicle. (Patient not taking: Reported on 09/04/2020), Disp: 2 tablet, Rfl: 0   Allergies  Allergen Reactions   Clarithromycin Nausea And Vomiting    Past Medical  History:  Diagnosis Date   Anemia    Hx of cardiovascular stress test    ETT-echo 3/16: Normal   Hypertension    Thyroid disease      Past Surgical History:  Procedure Laterality Date   BILATERAL SALPINGECTOMY Left 01/20/2015   Procedure: LAPAROSCOPIC LEFT SALPINGECTOMY;  Surgeon: Everlene Farrier, MD;  Location: [facility];  Service: Gynecology;  Laterality: Left;   CESAREAN SECTION     LAPAROSCOPIC ASSISTED VAGINAL HYSTERECTOMY N/A 01/20/2015   Procedure: LAPAROSCOPIC ASSISTED VAGINAL HYSTERECTOMY;  Surgeon: Everlene Farrier, MD;  Location: [facility];  Service: Gynecology;  Laterality: N/A;   MYOMECTOMY     2002    Family History  Problem Relation Age of Onset   Hypertension Maternal Grandmother    Stroke Maternal Grandmother    Heart attack Maternal Grandfather     Social History   Tobacco Use   Smoking status: Never   Smokeless tobacco: Never  Substance Use Topics   Alcohol use: No    Alcohol/week: 0.0 standard drinks    Comment: socially   Drug use: No    ROS   Objective:   Vitals: BP (!) 166/119 (BP Location: Left Arm)   Pulse (!) 114   Temp 98 F (36.7 C) (Oral)   Resp 18   LMP 11/21/2014   SpO2 98%   BP recheck was 157/109.   Physical Exam Constitutional:      General: She is not  in acute distress.    Appearance: Normal appearance. She is well-developed. She is not ill-appearing, toxic-appearing or diaphoretic.  HENT:     Head: Normocephalic and atraumatic.     Right Ear: Tympanic membrane, ear canal and external ear normal. There is no impacted cerumen.     Left Ear: Tympanic membrane, ear canal and external ear normal. There is no impacted cerumen.     Nose: Nose normal.     Mouth/Throat:     Mouth: Mucous membranes are moist.  Eyes:     General: No scleral icterus.       Right eye: No discharge.        Left eye: No discharge.     Extraocular Movements: Extraocular movements intact.     Conjunctiva/sclera: Conjunctivae normal.     Pupils: Pupils are  equal, round, and reactive to light.  Cardiovascular:     Rate and Rhythm: Normal rate and regular rhythm.     Pulses: Normal pulses.     Heart sounds: Normal heart sounds. No murmur heard.   No friction rub. No gallop.  Pulmonary:     Effort: Pulmonary effort is normal. No respiratory distress.     Breath sounds: Normal breath sounds. No stridor. No wheezing, rhonchi or rales.  Skin:    General: Skin is warm and dry.     Findings: No rash.  Neurological:     Mental Status: She is alert and oriented to person, place, and time.     Cranial Nerves: No cranial nerve deficit.     Motor: No weakness.     Coordination: Coordination normal.     Gait: Gait normal.     Deep Tendon Reflexes: Reflexes normal.     Comments: Negative Romberg and pronator drift.   Psychiatric:        Mood and Affect: Mood normal.        Behavior: Behavior normal.        Thought Content: Thought content normal.        Judgment: Judgment normal.   ED ECG REPORT   Date: 05/16/2021  EKG Time: 8:46 AM  Rate: 97bpm  Rhythm: normal sinus rhythm,  normal EKG, normal sinus rhythm  Axis: Normal  Intervals:none  ST&T Change: Nonspecific T wave flattening in aVL  Narrative Interpretation: Sinus rhythm at 97 bpm with nonspecific T wave change.  No acute findings, very comparable to previous EKG.    Assessment and Plan :   PDMP not reviewed this encounter.  1. Bad headache   2. Essential hypertension   3. Dizziness     Patient has elevated blood pressure readings with neurologic symptoms concerning for possible acute encephalopathy.  Discussed this with patient and as there is no sign of an otitis media, otitis externa emphasized that she needs to rule out an acute encephalopathy, consideration for head CT scan through the emergency room.  As she had a negative Romberg and pronator drift on exam, we deferred transfer by EMS.  Patient prefers to have her aunt drive her to the hospital.    Jaynee Eagles,  [place] 05/16/21 [phone]

## 2021-05-16 NOTE — ED Provider Notes (Signed)
[facility] EMERGENCY DEPARTMENT Provider Note   CSN: [id number] Arrival date & time: 05/16/21  0907     History No chief complaint on file.   Janet Marquez is a 50 y.o. female.  HPI  50 year old female with past medical history of HTN presents to the emergency department with concern for dizziness.  Patient states 3 days ago she developed a left-sided ear ache and positional dizziness.  She used over-the-counter eardrops on the ear which felt like it "opened" and since then the pain has been relieved.  However today she feels as if her dizziness is more pronounced.  She admits that it is positional, worse when she tries to lie back or flat.  She has had an intermittent headache for several months but feels like it is more persistent in the past couple days.  Denies any vision changes, facial droop, sensory changes.  Past Medical History:  Diagnosis Date   Anemia    Hx of cardiovascular stress test    ETT-echo 3/16: Normal   Hypertension    Thyroid disease     Patient Active Problem List   Diagnosis Date Noted   Costochondritis 01/14/2017   Fibroids 01/20/2015   Sinus tachycardia 11/23/2014   Chest pain 11/22/2014   Hypothyroidism 12/14/2009   DM2 (diabetes mellitus, type 2) ([place]) 12/14/2009   Anemia 12/14/2009   Essential hypertension 12/14/2009   GERD 12/14/2009    Past Surgical History:  Procedure Laterality Date   BILATERAL SALPINGECTOMY Left 01/20/2015   Procedure: LAPAROSCOPIC LEFT SALPINGECTOMY;  Surgeon: Everlene Farrier, MD;  Location: [facility];  Service: Gynecology;  Laterality: Left;   CESAREAN SECTION     LAPAROSCOPIC ASSISTED VAGINAL HYSTERECTOMY N/A 01/20/2015   Procedure: LAPAROSCOPIC ASSISTED VAGINAL HYSTERECTOMY;  Surgeon: Everlene Farrier, MD;  Location: [facility];  Service: Gynecology;  Laterality: N/A;   MYOMECTOMY     2002     OB History   No obstetric history on file.     Family History  Problem Relation Age of Onset   Hypertension  Maternal Grandmother    Stroke Maternal Grandmother    Heart attack Maternal Grandfather     Social History   Tobacco Use   Smoking status: Never   Smokeless tobacco: Never  Substance Use Topics   Alcohol use: No    Alcohol/week: 0.0 standard drinks    Comment: socially   Drug use: No    Home Medications Prior to Admission medications   Medication Sig Start Date End Date Taking? Authorizing Provider  acetaminophen (TYLENOL) 325 MG tablet Take 650 mg by mouth every 6 (six) hours as needed for mild pain or fever.   Yes [provider]  carboxymethylcellulose 1 % ophthalmic solution Place 1 drop into both eyes 3 (three) times daily.    Yes [provider]  Cholecalciferol 1.25 MG (50000 UT) capsule Take 1 capsule by mouth once a week. 07/26/16  Yes [provider]  hydrochlorothiazide (HYDRODIURIL) 12.5 MG tablet Take 12.5 mg by mouth daily. 09/02/20  Yes [provider]  metFORMIN (GLUCOPHAGE) 500 MG tablet Take 1 tablet (500 mg total) by mouth 2 (two) times daily with a meal. 03/28/15  Yes Elby Beck, FNP  metoprolol tartrate (LOPRESSOR) 100 MG tablet Take 100 mg by mouth 2 (two) times daily.  07/01/18  Yes [provider]  ALPRAZolam (XANAX) 0.25 MG tablet Take 1 tablet (0.25 mg total) by mouth 3 (three) times daily as needed for anxiety. Patient not taking:  No sig reported 12/28/20   Charlann Lange, PA-C  atorvastatin (LIPITOR) 20 MG tablet Take 20 mg by mouth daily. Patient not taking: Reported on 05/16/2021 07/08/20   [provider]  escitalopram (LEXAPRO) 10 MG tablet Take 10 mg by mouth daily. 04/25/21   [provider]  fluticasone (FLONASE) 50 MCG/ACT nasal spray Place 1 spray into both nostrils daily as needed for allergies. 01/28/21   [provider]  hydrOXYzine (ATARAX/VISTARIL) 10 MG tablet Take 10-20 mg by mouth every 8 (eight) hours as needed for anxiety. 03/25/21   [provider]   ibuprofen (ADVIL) 600 MG tablet Take 600 mg by mouth 2 (two) times daily as needed.    [provider]  triazolam (HALCION) 0.25 MG tablet Take 1 tab by mouth 1 hour prior to procedure with very light food, do not drive motor vehicle. Patient not taking: No sig reported 08/29/18   Magnus Sinning, MD    Allergies    Clarithromycin  Review of Systems   Review of Systems  Constitutional:  Negative for chills and fever.  HENT:  Positive for ear pain. Negative for congestion, ear discharge and facial swelling.   Eyes:  Negative for visual disturbance.  Respiratory:  Negative for shortness of breath.   Cardiovascular:  Negative for chest pain.  Gastrointestinal:  Negative for abdominal pain, diarrhea and vomiting.  Genitourinary:  Negative for dysuria.  Skin:  Negative for rash.  Neurological:  Positive for dizziness and light-headedness. Negative for syncope, facial asymmetry, speech difficulty, weakness, numbness and headaches.   Physical Exam Updated Vital Signs BP (!) 151/93 (BP Location: Right Arm)   Pulse 96   Temp 98.1 F (36.7 C) (Oral)   Resp 20   Ht '5\' 6"'$  (1.676 m)   Wt 88.5 kg   LMP 11/21/2014   SpO2 100%   BMI 31.47 kg/m   Physical Exam Vitals and nursing note reviewed.  Constitutional:      Appearance: Normal appearance.  HENT:     Head: Normocephalic.     Mouth/Throat:     Mouth: Mucous membranes are moist.  Eyes:     Pupils: Pupils are equal, round, and reactive to light.     Comments: No nystagmus  Cardiovascular:     Rate and Rhythm: Normal rate.  Pulmonary:     Effort: Pulmonary effort is normal. No respiratory distress.  Abdominal:     Palpations: Abdomen is soft.     Tenderness: There is no abdominal tenderness.  Musculoskeletal:     Cervical back: No rigidity.  Skin:    General: Skin is warm.  Neurological:     General: No focal deficit present.     Mental Status: She is alert and oriented to person, place, and time. Mental status  is at baseline.     Cranial Nerves: No cranial nerve deficit.     Motor: No weakness.  Psychiatric:        Mood and Affect: Mood normal.    ED Results / Procedures / Treatments   Labs (all labs ordered are listed, but only abnormal results are displayed) Labs Reviewed  COMPREHENSIVE METABOLIC PANEL - Abnormal; Notable for the following components:      Result Value   Glucose, Bld 324 (*)    BUN 5 (*)    AST 69 (*)    ALT 59 (*)    All other components within normal limits  CBG MONITORING, ED - Abnormal; Notable for the following components:  Glucose-Capillary 317 (*)    All other components within normal limits  CBC WITH DIFFERENTIAL/PLATELET    EKG EKG Interpretation  Date/Time:  Tuesday May 16 2021 12:03:36 EDT Ventricular Rate:  100 PR Interval:  140 QRS Duration: 77 QT Interval:  346 QTC Calculation: 447 R Axis:   48 Text Interpretation: Sinus tachycardia Sinus rhythm, no acute changes Confirmed by Lavenia Atlas [phone]) on 05/16/2021 12:21:22 PM  Radiology CT HEAD WO CONTRAST (5MM)  Result Date: 05/16/2021 CLINICAL DATA:  Dizziness, headache EXAM: CT HEAD WITHOUT CONTRAST TECHNIQUE: Contiguous axial images were obtained from the base of the skull through the vertex without intravenous contrast. COMPARISON:  09/04/2020 FINDINGS: Brain: No acute intracranial abnormality. Specifically, no hemorrhage, hydrocephalus, mass lesion, acute infarction, or significant intracranial injury. Vascular: No hyperdense vessel or unexpected calcification. Skull: No acute calvarial abnormality. Sinuses/Orbits: No acute findings Other: None IMPRESSION: Normal study. Electronically Signed   By: Rolm Baptise M.D.   On: 05/16/2021 10:41    Procedures Procedures   Medications Ordered in ED Medications  sodium chloride 0.9 % bolus 1,000 mL (has no administration in time range)  ketorolac (TORADOL) 30 MG/ML injection 30 mg (has no administration in time range)  meclizine (ANTIVERT) tablet  25 mg (25 mg Oral Given 05/16/21 1319)    ED Course  I have reviewed the triage vital signs and the nursing notes.  Pertinent labs & imaging results that were available during my care of the patient were reviewed by me and considered in my medical decision making (see chart for details).    MDM Rules/Calculators/A&P                           50 year old female presents the emergency department referred from urgent care for concern of dizziness with headache.  Patient reports left ear pain.  She states that the ear has been clogged for multiple days, she used over-the-counter drops which opened up the ear and relieve the ear pain.  She has had an intermittent headache going on for the past couple months that she feels has been more persistent for the past couple days and over the last 2 to 3 days she has had positional dizziness that was more severe today.  While sitting in bed and at rest she feels baseline.  Specifically with laying back the dizziness and room spinning sensation is reproducible.  She has no concerning nystagmus on exam, no focal neurologic deficit, no ataxia.  Treated patient for vertigo and headache.  After medications her symptoms have completely resolved.  She is been ambulatory without any complaints or difficulty.  Low suspicion for posterior stroke at this time with the associated ear pain, her improvement and positional aspect of her complaint.  Patient at this time appears safe and stable for discharge and will be treated as an outpatient.  Discharge plan and strict return to ED precautions discussed, patient verbalizes understanding and agreement.  Final Clinical Impression(s) / ED Diagnoses Final diagnoses:  None    Rx / DC Orders ED Discharge Orders     None        Lorelle Gibbs, DO 05/16/21 1519

## 2021-05-16 NOTE — ED Triage Notes (Signed)
Pt c/o left ear discomfort and dizziness. States ear discomfort has happened before but resolved on its own. C/o 4/10 pain to left half of head. States they think the ear is full b/c they can feel an "opening and closing" sensation when tugging on the outer ear.

## 2021-05-16 NOTE — ED Notes (Signed)
Patient verbalizes understanding of discharge instructions. Opportunity for questioning and answers were provided. Pt discharged from ED. 

## 2021-07-27 ENCOUNTER — Emergency Department (HOSPITAL_COMMUNITY)
Admission: EM | Admit: 2021-07-27 | Discharge: 2021-07-28 | Disposition: A | Discharge status: Home / Self Care | Payer: 59 | Attending: Emergency Medicine | Admitting: Emergency Medicine

## 2021-07-27 ENCOUNTER — Emergency Department (HOSPITAL_COMMUNITY): Payer: 59

## 2021-07-27 ENCOUNTER — Ambulatory Visit
Admission: EM | Admit: 2021-07-27 | Discharge: 2021-07-27 | Disposition: A | Discharge status: Home / Self Care | Payer: 59 | Attending: Internal Medicine | Admitting: Internal Medicine

## 2021-07-27 ENCOUNTER — Encounter (HOSPITAL_COMMUNITY): Payer: Self-pay | Admitting: Pharmacy Technician

## 2021-07-27 ENCOUNTER — Other Ambulatory Visit: Payer: Self-pay

## 2021-07-27 ENCOUNTER — Encounter: Payer: Self-pay | Admitting: Emergency Medicine

## 2021-07-27 DIAGNOSIS — I160 Hypertensive urgency: Secondary | ICD-10-CM

## 2021-07-27 DIAGNOSIS — R739 Hyperglycemia, unspecified: Secondary | ICD-10-CM

## 2021-07-27 DIAGNOSIS — Z79899 Other long term (current) drug therapy: Secondary | ICD-10-CM | POA: Diagnosis not present

## 2021-07-27 DIAGNOSIS — E119 Type 2 diabetes mellitus without complications: Secondary | ICD-10-CM | POA: Diagnosis not present

## 2021-07-27 DIAGNOSIS — R519 Headache, unspecified: Secondary | ICD-10-CM

## 2021-07-27 DIAGNOSIS — I10 Essential (primary) hypertension: Secondary | ICD-10-CM | POA: Insufficient documentation

## 2021-07-27 DIAGNOSIS — E1165 Type 2 diabetes mellitus with hyperglycemia: Secondary | ICD-10-CM

## 2021-07-27 DIAGNOSIS — R531 Weakness: Secondary | ICD-10-CM

## 2021-07-27 DIAGNOSIS — E039 Hypothyroidism, unspecified: Secondary | ICD-10-CM | POA: Insufficient documentation

## 2021-07-27 DIAGNOSIS — E1169 Type 2 diabetes mellitus with other specified complication: Secondary | ICD-10-CM

## 2021-07-27 DIAGNOSIS — E669 Obesity, unspecified: Secondary | ICD-10-CM

## 2021-07-27 DIAGNOSIS — R000 Tachycardia, unspecified: Secondary | ICD-10-CM | POA: Insufficient documentation

## 2021-07-27 DIAGNOSIS — Z7984 Long term (current) use of oral hypoglycemic drugs: Secondary | ICD-10-CM | POA: Diagnosis not present

## 2021-07-27 LAB — CBC
HCT: 42.5 % (ref 36.0–46.0)
Hemoglobin: 13.9 g/dL (ref 12.0–15.0)
MCH: 26.6 pg (ref 26.0–34.0)
MCHC: 32.7 g/dL (ref 30.0–36.0)
MCV: 81.3 fL (ref 80.0–100.0)
Platelets: 376 10*3/uL (ref 150–400)
RBC: 5.23 MIL/uL — ABNORMAL HIGH (ref 3.87–5.11)
RDW: 12.7 % (ref 11.5–15.5)
WBC: 8.9 10*3/uL (ref 4.0–10.5)
nRBC: 0 % (ref 0.0–0.2)

## 2021-07-27 LAB — CBG MONITORING, ED
Glucose-Capillary: 360 mg/dL — ABNORMAL HIGH (ref 70–99)
Glucose-Capillary: 476 mg/dL — ABNORMAL HIGH (ref 70–99)
Glucose-Capillary: 377 mg/dL — ABNORMAL HIGH (ref 70–99)

## 2021-07-27 LAB — BASIC METABOLIC PANEL
Anion gap: 12 (ref 5–15)
BUN: <5 mg/dL — ABNORMAL LOW (ref 6–20)
CO2: 24 mmol/L (ref 22–32)
Calcium: 9.8 mg/dL (ref 8.9–10.3)
Chloride: 99 mmol/L (ref 98–111)
Creatinine, Ser: 0.79 mg/dL (ref 0.44–1.00)
GFR, Estimated: >60 mL/min (ref >60)
Glucose, Bld: 395 mg/dL — ABNORMAL HIGH (ref 70–99)
Potassium: 3.8 mmol/L (ref 3.5–5.1)
Sodium: 135 mmol/L (ref 135–145)

## 2021-07-27 LAB — I-STAT BETA HCG BLOOD, ED (MC, WL, AP ONLY): I-stat hCG, quantitative: <5 m[IU]/mL (ref <5)

## 2021-07-27 LAB — POCT FASTING CBG KUC MANUAL ENTRY: POCT Glucose (KUC): 410 mg/dL — AB (ref 70–99)

## 2021-07-27 MED ORDER — SODIUM CHLORIDE 0.9 % IV BOLUS
1000.0000 mL | Freq: Once | INTRAVENOUS | Status: AC
  Administered 2021-07-28: 1000 mL via INTRAVENOUS

## 2021-07-27 MED ORDER — INSULIN ASPART 100 UNIT/ML IJ SOLN
8.0000 [IU] | Freq: Once | INTRAMUSCULAR | Status: AC
  Administered 2021-07-28: 8 [IU] via INTRAVENOUS

## 2021-07-27 NOTE — ED Triage Notes (Signed)
Patient c/o headache, possible elevated blood pressure, elevated glucose, general malaise.  Patient does not have a PCP.

## 2021-07-27 NOTE — ED Provider Notes (Signed)
[facility]    CSN: [id number] Arrival date & time: 07/27/21  0959      History   Chief Complaint Chief Complaint  Patient presents with   Headache    HPI Janet Marquez is a 50 y.o. female.   Patient presents to urgent care with 4-day history of headache, blurred vision, general malaise, urinary frequency, increased thirst.  Patient is concerned for high blood pressure and high blood sugar.  Patient reports that she does take lisinopril, metoprolol, and metformin daily.  Last saw PCP in December 2021.  Has been getting medications refilled by urgent care.  Denies chest pain, shortness of breath, dizziness, nausea, vomiting.  Denies any head injury.   Headache  Past Medical History:  Diagnosis Date   Anemia    Hx of cardiovascular stress test    ETT-echo 3/16: Normal   Hypertension    Thyroid disease     Patient Active Problem List   Diagnosis Date Noted   Costochondritis 01/14/2017   Fibroids 01/20/2015   Sinus tachycardia 11/23/2014   Chest pain 11/22/2014   Hypothyroidism 12/14/2009   DM2 (diabetes mellitus, type 2) ([place]) 12/14/2009   Anemia 12/14/2009   Essential hypertension 12/14/2009   GERD 12/14/2009    Past Surgical History:  Procedure Laterality Date   BILATERAL SALPINGECTOMY Left 01/20/2015   Procedure: LAPAROSCOPIC LEFT SALPINGECTOMY;  Surgeon: Everlene Farrier, MD;  Location: [facility];  Service: Gynecology;  Laterality: Left;   CESAREAN SECTION     LAPAROSCOPIC ASSISTED VAGINAL HYSTERECTOMY N/A 01/20/2015   Procedure: LAPAROSCOPIC ASSISTED VAGINAL HYSTERECTOMY;  Surgeon: Everlene Farrier, MD;  Location: [facility];  Service: Gynecology;  Laterality: N/A;   MYOMECTOMY     2002    OB History   No obstetric history on file.      Home Medications    Prior to Admission medications   Medication Sig Start Date End Date Taking? Authorizing Provider  acetaminophen (TYLENOL) 325 MG tablet Take 650 mg by mouth every 6 (six) hours as needed for  mild pain or fever.   Yes [provider]  carboxymethylcellulose 1 % ophthalmic solution Place 1 drop into both eyes 3 (three) times daily.    Yes [provider]  Cholecalciferol 1.25 MG (50000 UT) capsule Take 1 capsule by mouth once a week. 07/26/16  Yes [provider]  escitalopram (LEXAPRO) 10 MG tablet Take 10 mg by mouth daily. 04/25/21  Yes [provider]  fluticasone (FLONASE) 50 MCG/ACT nasal spray Place 1 spray into both nostrils daily as needed for allergies. 01/28/21  Yes [provider]  hydrochlorothiazide (HYDRODIURIL) 12.5 MG tablet Take 12.5 mg by mouth daily. 09/02/20  Yes [provider]  hydrOXYzine (ATARAX/VISTARIL) 10 MG tablet Take 10-20 mg by mouth every 8 (eight) hours as needed for anxiety. 03/25/21  Yes [provider]  ibuprofen (ADVIL) 600 MG tablet Take 600 mg by mouth 2 (two) times daily as needed.   Yes [provider]  meclizine (ANTIVERT) 25 MG tablet Take 1 tablet (25 mg total) by mouth 3 (three) times daily as needed for dizziness. 05/16/21  Yes Horton, Alvin Critchley, DO  metFORMIN (GLUCOPHAGE) 500 MG tablet Take 1 tablet (500 mg total) by mouth 2 (two) times daily with a meal. 03/28/15  Yes Elby Beck, FNP  metoprolol tartrate (LOPRESSOR) 100 MG tablet Take 100 mg by mouth 2 (two) times daily.  07/01/18  Yes [provider]  ALPRAZolam (XANAX) 0.25 MG tablet Take 1  tablet (0.25 mg total) by mouth 3 (three) times daily as needed for anxiety. Patient not taking: No sig reported 12/28/20   Charlann Lange, PA-C  atorvastatin (LIPITOR) 20 MG tablet Take 20 mg by mouth daily. Patient not taking: Reported on 05/16/2021 07/08/20   [provider]  triazolam (HALCION) 0.25 MG tablet Take 1 tab by mouth 1 hour prior to procedure with very light food, do not drive motor vehicle. Patient not taking: No sig reported 08/29/18   Magnus Sinning, MD    Family History Family History   Problem Relation Age of Onset   Hypertension Maternal Grandmother    Stroke Maternal Grandmother    Heart attack Maternal Grandfather     Social History Social History   Tobacco Use   Smoking status: Never   Smokeless tobacco: Never  Substance Use Topics   Alcohol use: No    Alcohol/week: 0.0 standard drinks    Comment: socially   Drug use: No     Allergies   Clarithromycin   Review of Systems Review of Systems Per HPI  Physical Exam Triage Vital Signs ED Triage Vitals  Enc Vitals Group     BP 07/27/21 1036 (!) 170/136     Pulse Rate 07/27/21 1036 (!) 115     Resp 07/27/21 1036 18     Temp 07/27/21 1036 99 F (37.2 C)     Temp Source 07/27/21 1036 Oral     SpO2 07/27/21 1036 98 %     Weight 07/27/21 1037 190 lb (86.2 kg)     Height 07/27/21 1037 5\' 6"  (1.676 m)     Head Circumference --      Peak Flow --      Pain Score --      Pain Loc --      Pain Edu? --      Excl. in [place]? --    No data found.  Updated Vital Signs BP (!) 170/136 (BP Location: Left Arm)   Pulse (!) 115   Temp 99 F (37.2 C) (Oral)   Resp 18   Ht 5\' 6"  (1.676 m)   Wt 190 lb (86.2 kg)   LMP 11/21/2014   SpO2 98%   BMI 30.67 kg/m   Visual Acuity Right Eye Distance:   Left Eye Distance:   Bilateral Distance:    Right Eye Near:   Left Eye Near:    Bilateral Near:     Physical Exam Constitutional:      General: She is not in acute distress.    Appearance: Normal appearance. She is not toxic-appearing or diaphoretic.  HENT:     Head: Normocephalic and atraumatic.     Right Ear: Tympanic membrane and ear canal normal.     Left Ear: Tympanic membrane and ear canal normal.     Nose: Nose normal.     Mouth/Throat:     Mouth: Mucous membranes are moist.     Pharynx: No posterior oropharyngeal erythema.  Eyes:     Extraocular Movements: Extraocular movements intact.     Conjunctiva/sclera: Conjunctivae normal.     Pupils: Pupils are equal, round, and reactive to light.   Cardiovascular:     Rate and Rhythm: Normal rate and regular rhythm.     Pulses: Normal pulses.     Heart sounds: Normal heart sounds.  Pulmonary:     Effort: Pulmonary effort is normal. No respiratory distress.     Breath sounds: Normal breath sounds.  Skin:  General: Skin is warm and dry.  Neurological:     General: No focal deficit present.     Mental Status: She is alert and oriented to person, place, and time. Mental status is at baseline.     Cranial Nerves: Cranial nerves are intact.     Sensory: Sensation is intact.     Motor: Motor function is intact.     Coordination: Coordination is intact.     Gait: Gait is intact.  Psychiatric:        Mood and Affect: Mood normal.        Behavior: Behavior normal.        Thought Content: Thought content normal.        Judgment: Judgment normal.     UC Treatments / Results  Labs (all labs ordered are listed, but only abnormal results are displayed) Labs Reviewed  POCT FASTING CBG [facility] MANUAL ENTRY - Abnormal; Notable for the following components:      Result Value   POCT Glucose ([facility]) 410 (*)    All other components within normal limits    EKG   Radiology No results found.  Procedures Procedures (including critical care time)  Medications Ordered in UC Medications - No data to display  Initial Impression / Assessment and Plan / UC Course  I have reviewed the triage vital signs and the nursing notes.  Pertinent labs & imaging results that were available during my care of the patient were reviewed by me and considered in my medical decision making (see chart for details).     Patient has significantly elevated blood pressure and is exhibiting signs of hypertensive urgency given headache and general malaise.  Patient also has blood sugar of 410 in urgent care as well as increased thirst and increased urinary frequency.  Patient advised that she will need to go to the hospital for further evaluation and management  given current symptoms and vital signs.  Patient was agreeable with plan.  Vital signs stable at discharge.  Advised patient to have family member take her to the hospital but patient wishes to take her self.  Risks associated with patient transferring herself to the hospital were discussed with patient.  Patient accepted risks and voiced understanding. Final Clinical Impressions(s) / UC Diagnoses   Final diagnoses:  Hypertensive urgency  Hyperglycemia  Acute nonintractable headache, unspecified headache type     Discharge Instructions      Please go to the hospital as soon as you leave urgent care for further evaluation and management.     ED Prescriptions   None    PDMP not reviewed this encounter.   Odis Luster, FNP 07/27/21 1110

## 2021-07-27 NOTE — Discharge Instructions (Addendum)
Please go to the hospital as soon as you leave urgent care for further evaluation and management. 

## 2021-07-27 NOTE — ED Notes (Signed)
Pt refused vitals. Pt stated "I'm on the phone right now and can not deal with that beeping."

## 2021-07-27 NOTE — ED Provider Notes (Signed)
Emergency Medicine Provider Triage Evaluation Note  Janet Marquez , a 50 y.o. female  was evaluated in triage.  Pt complains of general malaise, polyuria, polydipsia, fatigue, headache, and blurred vision over the last few days.  She states her blood pressure has been elevated.  She was seen and evaluated at urgent care and was sent here for further evaluation secondary to hypertensive urgency with a diastolic pressure of 053 and hyperglycemia with a sugar in the 400s.  Review of Systems  Positive:  Negative: See above  Physical Exam  BP (!) 169/107 (BP Location: Right Arm)   Pulse (!) 103   Temp 98.5 F (36.9 C) (Oral)   Resp 19   LMP 11/21/2014   SpO2 100%  Gen:   Awake, no distress   Resp:  Normal effort  MSK:   Moves extremities without difficulty  Other:  Tearful   Medical Decision Making  Medically screening exam initiated at 1:28 PM.  Appropriate orders placed.  Janet Marquez was informed that the remainder of the evaluation will be completed by another provider, this initial triage assessment does not replace that evaluation, and the importance of remaining in the ED until their evaluation is complete.     Myna Bright Cornell, PA-C 07/27/21 1329    Tegeler, Gwenyth Allegra, MD 07/27/21 [phone]

## 2021-07-28 LAB — URINALYSIS, ROUTINE W REFLEX MICROSCOPIC
Bilirubin Urine: NEGATIVE
Glucose, UA: >=500 mg/dL — AB
Hgb urine dipstick: NEGATIVE
Ketones, ur: 20 mg/dL — AB
Leukocytes,Ua: NEGATIVE
Nitrite: NEGATIVE
Protein, ur: 30 mg/dL — AB
Specific Gravity, Urine: 1.035 — ABNORMAL HIGH (ref 1.005–1.030)
pH: 5 (ref 5.0–8.0)

## 2021-07-28 LAB — CBG MONITORING, ED
Glucose-Capillary: 272 mg/dL — ABNORMAL HIGH (ref 70–99)
Glucose-Capillary: 406 mg/dL — ABNORMAL HIGH (ref 70–99)

## 2021-07-28 MED ORDER — METFORMIN HCL 500 MG PO TABS: 500.0000 mg | ORAL_TABLET | Freq: Two times a day (BID) | ORAL | 1 refills | Status: DC

## 2021-07-28 NOTE — Discharge Instructions (Signed)
Continue metformin as previously prescribed.  Keep a record of your blood sugars at home and take this with you to your next primary doctor's appointment.  Return to the ER if symptoms significantly worsen or change.

## 2021-07-28 NOTE — ED Notes (Signed)
Pt ambulated to restroom at this time without assitance

## 2021-07-28 NOTE — ED Provider Notes (Signed)
[facility] EMERGENCY DEPARTMENT Provider Note   CSN: [id number] Arrival date & time: 07/27/21  1155     History Chief Complaint  Patient presents with   Fatigue   Headache   Hypertension    Janet Marquez is a 50 y.o. female.  Patient is a 50 year old female with past medical history of diabetes, hypertension, GERD.  Patient presenting today for evaluation of a 5-day history of feeling generally unwell, increased thirst, increased urination, fatigue, and headache.  She has history of diabetes and blood sugars have been running somewhat high.  She denies to me she is having any fevers, chills, or bloody stools.  The history is provided by the patient.  Headache Pain location:  Generalized Quality:  Dull Radiates to:  Does not radiate Onset quality:  Gradual Duration:  5 days Timing:  Constant Progression:  Worsening Chronicity:  New Relieved by:  Nothing Worsened by:  Nothing Ineffective treatments:  None tried Hypertension Associated symptoms include headaches.      Past Medical History:  Diagnosis Date   Anemia    Hx of cardiovascular stress test    ETT-echo 3/16: Normal   Hypertension    Thyroid disease     Patient Active Problem List   Diagnosis Date Noted   Costochondritis 01/14/2017   Fibroids 01/20/2015   Sinus tachycardia 11/23/2014   Chest pain 11/22/2014   Hypothyroidism 12/14/2009   DM2 (diabetes mellitus, type 2) ([place]) 12/14/2009   Anemia 12/14/2009   Essential hypertension 12/14/2009   GERD 12/14/2009    Past Surgical History:  Procedure Laterality Date   BILATERAL SALPINGECTOMY Left 01/20/2015   Procedure: LAPAROSCOPIC LEFT SALPINGECTOMY;  Surgeon: Everlene Farrier, MD;  Location: [facility];  Service: Gynecology;  Laterality: Left;   CESAREAN SECTION     LAPAROSCOPIC ASSISTED VAGINAL HYSTERECTOMY N/A 01/20/2015   Procedure: LAPAROSCOPIC ASSISTED VAGINAL HYSTERECTOMY;  Surgeon: Everlene Farrier, MD;  Location: [facility];  Service:  Gynecology;  Laterality: N/A;   MYOMECTOMY     2002     OB History   No obstetric history on file.     Family History  Problem Relation Age of Onset   Hypertension Maternal Grandmother    Stroke Maternal Grandmother    Heart attack Maternal Grandfather     Social History   Tobacco Use   Smoking status: Never   Smokeless tobacco: Never  Substance Use Topics   Alcohol use: No    Alcohol/week: 0.0 standard drinks    Comment: socially   Drug use: No    Home Medications Prior to Admission medications   Medication Sig Start Date End Date Taking? Authorizing Provider  acetaminophen (TYLENOL) 325 MG tablet Take 650 mg by mouth every 6 (six) hours as needed for mild pain or fever.    [provider]  ALPRAZolam Duanne Moron) 0.25 MG tablet Take 1 tablet (0.25 mg total) by mouth 3 (three) times daily as needed for anxiety. Patient not taking: No sig reported 12/28/20   Charlann Lange, PA-C  atorvastatin (LIPITOR) 20 MG tablet Take 20 mg by mouth daily. Patient not taking: Reported on 05/16/2021 07/08/20   [provider]  carboxymethylcellulose 1 % ophthalmic solution Place 1 drop into both eyes 3 (three) times daily.     [provider]  Cholecalciferol 1.25 MG (50000 UT) capsule Take 1 capsule by mouth once a week. 07/26/16   [provider]  escitalopram (LEXAPRO) 10 MG tablet Take 10 mg by mouth daily. 04/25/21  [provider]  fluticasone (FLONASE) 50 MCG/ACT nasal spray Place 1 spray into both nostrils daily as needed for allergies. 01/28/21   [provider]  hydrochlorothiazide (HYDRODIURIL) 12.5 MG tablet Take 12.5 mg by mouth daily. 09/02/20   [provider]  hydrOXYzine (ATARAX/VISTARIL) 10 MG tablet Take 10-20 mg by mouth every 8 (eight) hours as needed for anxiety. 03/25/21   [provider]  ibuprofen (ADVIL) 600 MG tablet Take 600 mg by mouth 2 (two) times daily as needed.    [provider]   meclizine (ANTIVERT) 25 MG tablet Take 1 tablet (25 mg total) by mouth 3 (three) times daily as needed for dizziness. 05/16/21   Horton, Alvin Critchley, DO  metFORMIN (GLUCOPHAGE) 500 MG tablet Take 1 tablet (500 mg total) by mouth 2 (two) times daily with a meal. 03/28/15   Elby Beck, FNP  metoprolol tartrate (LOPRESSOR) 100 MG tablet Take 100 mg by mouth 2 (two) times daily.  07/01/18   [provider]  triazolam (HALCION) 0.25 MG tablet Take 1 tab by mouth 1 hour prior to procedure with very light food, do not drive motor vehicle. Patient not taking: No sig reported 08/29/18   Magnus Sinning, MD    Allergies    Clarithromycin  Review of Systems   Review of Systems  Neurological:  Positive for headaches.  All other systems reviewed and are negative.  Physical Exam Updated Vital Signs BP (!) 133/96   Pulse 98   Temp 98.5 F (36.9 C) (Oral)   Resp (!) 26   LMP 11/21/2014   SpO2 100%   Physical Exam Vitals and nursing note reviewed.  Constitutional:      General: She is not in acute distress.    Appearance: She is well-developed. She is not diaphoretic.  HENT:     Head: Normocephalic and atraumatic.  Eyes:     General: No visual field deficit. Cardiovascular:     Rate and Rhythm: Normal rate and regular rhythm.     Heart sounds: No murmur heard.   No friction rub. No gallop.  Pulmonary:     Effort: Pulmonary effort is normal. No respiratory distress.     Breath sounds: Normal breath sounds. No wheezing.  Abdominal:     General: Bowel sounds are normal. There is no distension.     Palpations: Abdomen is soft.     Tenderness: There is no abdominal tenderness.  Musculoskeletal:        General: Normal range of motion.     Cervical back: Normal range of motion and neck supple.  Skin:    General: Skin is warm and dry.  Neurological:     General: No focal deficit present.     Mental Status: She is alert and oriented to person, place, and time.     Cranial  Nerves: No cranial nerve deficit.     Sensory: No sensory deficit.     Motor: No weakness.     Coordination: Coordination normal.    ED Results / Procedures / Treatments   Labs (all labs ordered are listed, but only abnormal results are displayed) Labs Reviewed  BASIC METABOLIC PANEL - Abnormal; Notable for the following components:      Result Value   Glucose, Bld 395 (*)    BUN <5 (*)    All other components within normal limits  CBC - Abnormal; Notable for the following components:   RBC 5.23 (*)    All other components  within normal limits  CBG MONITORING, ED - Abnormal; Notable for the following components:   Glucose-Capillary 360 (*)    All other components within normal limits  CBG MONITORING, ED - Abnormal; Notable for the following components:   Glucose-Capillary 476 (*)    All other components within normal limits  CBG MONITORING, ED - Abnormal; Notable for the following components:   Glucose-Capillary 377 (*)    All other components within normal limits  URINALYSIS, ROUTINE W REFLEX MICROSCOPIC  I-STAT BETA HCG BLOOD, ED ([facility], [facility], [facility] ONLY)    EKG EKG Interpretation  Date/Time:  Thursday July 27 2021 13:03:25 EDT Ventricular Rate:  115 PR Interval:  136 QRS Duration: 70 QT Interval:  320 QTC Calculation: 442 R Axis:   53 Text Interpretation: Sinus tachycardia Nonspecific ST abnormality Abnormal ECG Confirmed by Veryl Speak [phone] on 07/28/2021 4:27:01 AM  Radiology No results found.  Procedures Procedures   Medications Ordered in ED Medications  sodium chloride 0.9 % bolus 1,000 mL (has no administration in time range)  insulin aspart (novoLOG) injection 8 Units (has no administration in time range)    ED Course  I have reviewed the triage vital signs and the nursing notes.  Pertinent labs & imaging results that were available during my care of the patient were reviewed by me and considered in my medical decision making (see chart for  details).    MDM Rules/Calculators/A&P  Patient presenting here with weakness and feeling unwell as described in the HPI.  I suspect this is related to elevated blood sugar as the remainder of her work-up is unremarkable including head CT, EKG, laboratory studies.  Patient given insulin along with IV fluids.  She has remained stable throughout her emergency department course.  At this point, I feel as though patient can safely be discharged with outpatient follow-up.  She has requested a refill of her metformin which will be provided.  Final Clinical Impression(s) / ED Diagnoses Final diagnoses:  None    Rx / DC Orders ED Discharge Orders     None        Veryl Speak, MD 07/28/21 [phone]

## 2021-10-12 ENCOUNTER — Ambulatory Visit: Payer: 59 | Admitting: Family

## 2021-10-30 ENCOUNTER — Ambulatory Visit (INDEPENDENT_AMBULATORY_CARE_PROVIDER_SITE_OTHER): Payer: 59 | Admitting: Family

## 2021-10-30 ENCOUNTER — Encounter: Payer: Self-pay | Admitting: Family

## 2021-10-30 ENCOUNTER — Other Ambulatory Visit: Payer: Self-pay

## 2021-10-30 VITALS — BP 136/82 | HR 85 | Temp 97.1°F | Ht 67.5 in | Wt 197.0 lb

## 2021-10-30 DIAGNOSIS — E782 Mixed hyperlipidemia: Secondary | ICD-10-CM

## 2021-10-30 DIAGNOSIS — K219 Gastro-esophageal reflux disease without esophagitis: Secondary | ICD-10-CM

## 2021-10-30 DIAGNOSIS — R3915 Urgency of urination: Secondary | ICD-10-CM | POA: Diagnosis not present

## 2021-10-30 DIAGNOSIS — E559 Vitamin D deficiency, unspecified: Secondary | ICD-10-CM

## 2021-10-30 DIAGNOSIS — H04123 Dry eye syndrome of bilateral lacrimal glands: Secondary | ICD-10-CM

## 2021-10-30 DIAGNOSIS — Z1231 Encounter for screening mammogram for malignant neoplasm of breast: Secondary | ICD-10-CM

## 2021-10-30 DIAGNOSIS — I10 Essential (primary) hypertension: Secondary | ICD-10-CM | POA: Diagnosis not present

## 2021-10-30 DIAGNOSIS — Z6830 Body mass index (BMI) 30.0-30.9, adult: Secondary | ICD-10-CM

## 2021-10-30 DIAGNOSIS — E785 Hyperlipidemia, unspecified: Secondary | ICD-10-CM

## 2021-10-30 DIAGNOSIS — Z7689 Persons encountering health services in other specified circumstances: Secondary | ICD-10-CM

## 2021-10-30 DIAGNOSIS — J302 Other seasonal allergic rhinitis: Secondary | ICD-10-CM

## 2021-10-30 DIAGNOSIS — E039 Hypothyroidism, unspecified: Secondary | ICD-10-CM

## 2021-10-30 DIAGNOSIS — H9312 Tinnitus, left ear: Secondary | ICD-10-CM

## 2021-10-30 DIAGNOSIS — Z1211 Encounter for screening for malignant neoplasm of colon: Secondary | ICD-10-CM

## 2021-10-30 DIAGNOSIS — F411 Generalized anxiety disorder: Secondary | ICD-10-CM | POA: Diagnosis not present

## 2021-10-30 DIAGNOSIS — K529 Noninfective gastroenteritis and colitis, unspecified: Secondary | ICD-10-CM

## 2021-10-30 DIAGNOSIS — H6122 Impacted cerumen, left ear: Secondary | ICD-10-CM

## 2021-10-30 DIAGNOSIS — E1169 Type 2 diabetes mellitus with other specified complication: Secondary | ICD-10-CM

## 2021-10-30 DIAGNOSIS — E669 Obesity, unspecified: Secondary | ICD-10-CM

## 2021-10-30 LAB — POCT URINALYSIS DIPSTICK
Bilirubin, UA: NEGATIVE
Blood, UA: NEGATIVE
Glucose, UA: POSITIVE — AB
Ketones, UA: NEGATIVE
Leukocytes, UA: NEGATIVE
Nitrite, UA: NEGATIVE
Protein, UA: POSITIVE — AB
Spec Grav, UA: >=1.03 — AB (ref 1.010–1.025)
Urobilinogen, UA: 0.2 E.U./dL
pH, UA: 6 (ref 5.0–8.0)

## 2021-10-30 MED ORDER — ATORVASTATIN CALCIUM 20 MG PO TABS: 20.0000 mg | ORAL_TABLET | Freq: Every day | ORAL | 2 refills | Status: DC

## 2021-10-30 MED ORDER — MECLIZINE HCL 25 MG PO TABS: 25.0000 mg | ORAL_TABLET | Freq: Three times a day (TID) | ORAL | 0 refills | Status: DC | PRN

## 2021-10-30 MED ORDER — ESCITALOPRAM OXALATE 10 MG PO TABS: 10.0000 mg | ORAL_TABLET | Freq: Every day | ORAL | 2 refills | Status: DC

## 2021-10-30 MED ORDER — METFORMIN HCL 500 MG PO TABS: 500.0000 mg | ORAL_TABLET | Freq: Two times a day (BID) | ORAL | 1 refills | Status: DC

## 2021-10-30 MED ORDER — HYDROCHLOROTHIAZIDE 12.5 MG PO TABS: 12.5000 mg | ORAL_TABLET | Freq: Every day | ORAL | 2 refills | Status: DC

## 2021-10-30 MED ORDER — METOPROLOL TARTRATE 100 MG PO TABS: 100.0000 mg | ORAL_TABLET | Freq: Two times a day (BID) | ORAL | 0 refills | Status: DC

## 2021-10-30 NOTE — Progress Notes (Addendum)
Provider: Marlowe Sax FNP-C   Aedan Geimer, Nelda Bucks, NP  Patient Care Team: Abrahm Mancia, Nelda Bucks, NP as PCP - General (Family Medicine) Janith Lima, MD  Extended Emergency Contact Information Primary Emergency Contact: [facility] Address: [address]          [place], [place] [address] Johnnette Litter of [place] Phone: [phone] Relation: Mother Secondary Emergency Contact: Elta Guadeloupe Address: [address]          [place], [place] [address] Johnnette Litter of [place] Phone: [phone] Relation: Aunt  Code Status:  Full Code  Goals of care: Advanced Directive information Advanced Directives 10/30/2021  Does Patient Have a Medical Advance Directive? No  Would patient like information on creating a medical advance directive? No - Patient declined     Chief Complaint  Patient presents with   Establish Care    New patient to establish care     HPI:  Pt is a 51 y.o. female seen today to establish care at the practice for medical management of chronic diseases.   Type Diabetes Mellitus - previous A1C 15.1 at diagnosis then went down to 7.0  Ozempic made gave her GERD.Rodesi made her dizziness and syncope. Current on metformin.  Has had increased tiredness and frequency urination.  - Has not seen an opthalmology.  Hypertension - check B/p occasional.Metoprolol and Hczt   Hyperlipidemia - on Atorvastatin 20 mg tablet.diet sometimes healthy and sometimes not healthy.eats out most of the time. Lost 40 lbs with Ozempic but has gained 20 lbs.  Vertigo -  on meclizine as needed   Panic attack - off medication has been able to find ways to deal.   Dry eyes - uses Systane.  Vitamin D - on supplement   Nasal congestion /environment allergies - on Flonase.   IBD - sometimes explosive but Benefiber helps.no constipation.   Anemia - no recent issues since 2016.Had iron infusion.Once fibroids were removed it resolved.   Mammogram - done in 2020 at [facility]    Has a gynecologist will schedule for pap smear.   Exercise by riding bike,cubie every day.Treadmill is outside so has not been using it.   Request refill of all her medication.    Past Medical History:  Diagnosis Date   Acid reflux    Anemia    Anxiety    Diabetes mellitus ([place])    Hx of cardiovascular stress test    ETT-echo 3/16: Normal   Hyperlipidemia    Hypertension    Inflammatory bowel diseases (IBD)    Thyroid disease    Vertigo    Past Surgical History:  Procedure Laterality Date   BILATERAL SALPINGECTOMY Left 01/20/2015   Procedure: LAPAROSCOPIC LEFT SALPINGECTOMY;  Surgeon: Everlene Farrier, MD;  Location: [facility];  Service: Gynecology;  Laterality: Left;   CESAREAN SECTION  2006   LAPAROSCOPIC ASSISTED VAGINAL HYSTERECTOMY N/A 01/20/2015   Procedure: LAPAROSCOPIC ASSISTED VAGINAL HYSTERECTOMY;  Surgeon: Everlene Farrier, MD;  Location: [facility];  Service: Gynecology;  Laterality: N/A;   MYOMECTOMY     2002    Allergies  Allergen Reactions   Clarithromycin Nausea And Vomiting    Allergies as of 10/30/2021       Reactions   Clarithromycin Nausea And Vomiting        Medication List        Accurate as of October 30, 2021  1:39 PM. If you have any questions, ask your nurse or doctor.  atorvastatin 20 MG tablet Commonly known as: LIPITOR Take 20 mg by mouth daily.   Cholecalciferol 1.25 MG (50000 UT) capsule Take 1 capsule by mouth once a week.   escitalopram 10 MG tablet Commonly known as: LEXAPRO Take 10 mg by mouth daily.   fluticasone 50 MCG/ACT nasal spray Commonly known as: FLONASE Place 1 spray into both nostrils as needed for allergies or rhinitis.   hydrochlorothiazide 12.5 MG tablet Commonly known as: HYDRODIURIL Take 12.5 mg by mouth daily.   hydrOXYzine 10 MG tablet Commonly known as: ATARAX Take 10-20 mg by mouth every 8 (eight) hours as needed for anxiety.   meclizine 25 MG tablet Commonly known as: ANTIVERT Take 1  tablet (25 mg total) by mouth 3 (three) times daily as needed for dizziness.   metFORMIN 500 MG tablet Commonly known as: GLUCOPHAGE Take 1 tablet (500 mg total) by mouth 2 (two) times daily with a meal.   metoprolol tartrate 100 MG tablet Commonly known as: LOPRESSOR Take 100 mg by mouth 2 (two) times daily.   Systane 0.4-0.3 % Soln Generic drug: Polyethyl Glycol-Propyl Glycol Apply 1 drop to eye as needed.        Review of Systems  Constitutional:  Negative for appetite change, chills, fatigue, fever and unexpected weight change.  HENT:  Positive for congestion, dental problem and rhinorrhea. Negative for ear discharge, ear pain, facial swelling, hearing loss, nosebleeds, postnasal drip, sinus pressure, sinus pain, sneezing, sore throat, tinnitus and trouble swallowing.        Dentures  Eyes:  Negative for pain, discharge, redness, itching and visual disturbance.  Respiratory:  Negative for cough, chest tightness, shortness of breath and wheezing.   Cardiovascular:  Negative for chest pain, palpitations and leg swelling.  Gastrointestinal:  Negative for abdominal distention, abdominal pain, blood in stool, constipation, diarrhea, nausea and vomiting.  Endocrine: Negative for cold intolerance, heat intolerance, polydipsia, polyphagia and polyuria.       Hot flushes   Genitourinary:  Positive for frequency and urgency. Negative for difficulty urinating, dysuria and flank pain.  Musculoskeletal:  Negative for arthralgias, back pain, gait problem, joint swelling, myalgias, neck pain and neck stiffness.  Skin:  Negative for color change, pallor, rash and wound.  Neurological:  Negative for syncope, speech difficulty, weakness, light-headedness, numbness and headaches.       Vertigo   Hematological:  Does not bruise/bleed easily.  Psychiatric/Behavioral:  Positive for sleep disturbance. Negative for agitation, behavioral problems, confusion, hallucinations, self-injury and suicidal  ideas. The patient is nervous/anxious.        Forgetful    Immunization History  Administered Date(s) Administered   Hepatitis B 08/26/2021   Influenza, Seasonal, Injecte, Preservative Fre 11/23/2014, 07/10/2015   Influenza,inj,Quad PF,6+ Mos 11/23/2014, 07/10/2015, 07/18/2021   Influenza-Unspecified 11/23/2014, 07/10/2015   PFIZER(Purple Top)SARS-COV-2 Vaccination 12/29/2019, 02/09/2020, 08/08/2020, 03/05/2021   PNEUMOCOCCAL CONJUGATE-20 07/18/2021   Pfizer Covid-19 Vaccine Bivalent Booster 63yrs & up 07/18/2021   Pneumococcal Polysaccharide-23 08/16/2016   Td 10/16/1999   Tdap 04/03/2013, 08/26/2021   Zoster Recombinat (Shingrix) 03/05/2021, 07/18/2021   Pertinent  Health Maintenance Due  Topic Date Due   OPHTHALMOLOGY EXAM  Never done   URINE MICROALBUMIN  Never done   COLONOSCOPY (Pts 45-9yrs Insurance coverage will need to be confirmed)  Never done   HEMOGLOBIN A1C  09/27/2015   FOOT EXAM  03/27/2016   PAP SMEAR-Modifier  12/07/2017   MAMMOGRAM  05/07/2020   INFLUENZA VACCINE  Completed   Fall Risk 12/28/2020 05/16/2021  05/16/2021 07/27/2021 10/30/2021  Falls in the past year? - - - - 0  Was there an injury with Fall? - - - - 0  Fall Risk Category Calculator - - - - 0  Fall Risk Category - - - - Low  Patient Fall Risk Level Low fall risk Low fall risk Moderate fall risk Low fall risk Low fall risk  Patient at Risk for Falls Due to - - - - No Fall Risks  Fall risk Follow up - - - - Falls evaluation completed   Functional Status Survey:    Vitals:   10/30/21 1308  BP: 136/82  Pulse: 85  Temp: (!) 97.1 F (36.2 C)  TempSrc: Temporal  SpO2: 98%  Weight: 197 lb (89.4 kg)  Height: 5' 7.5" (1.715 m)   Body mass index is 30.4 kg/m. Physical Exam Vitals reviewed.  Constitutional:      General: She is not in acute distress.    Appearance: Normal appearance. She is obese. She is not ill-appearing or diaphoretic.  HENT:     Head: Normocephalic.     Right Ear: Tympanic  membrane, ear canal and external ear normal. There is no impacted cerumen.     Left Ear: Tympanic membrane, ear canal and external ear normal. There is no impacted cerumen.     Nose: Nose normal. No congestion or rhinorrhea.     Mouth/Throat:     Mouth: Mucous membranes are moist.     Pharynx: Oropharynx is clear. No oropharyngeal exudate or posterior oropharyngeal erythema.  Eyes:     General: No scleral icterus.       Right eye: No discharge.        Left eye: No discharge.     Extraocular Movements: Extraocular movements intact.     Conjunctiva/sclera: Conjunctivae normal.     Pupils: Pupils are equal, round, and reactive to light.  Neck:     Vascular: No carotid bruit.  Cardiovascular:     Rate and Rhythm: Normal rate and regular rhythm.     Pulses: Normal pulses.     Heart sounds: Normal heart sounds. No murmur heard.   No friction rub. No gallop.  Pulmonary:     Effort: Pulmonary effort is normal. No respiratory distress.     Breath sounds: Normal breath sounds. No wheezing, rhonchi or rales.  Chest:     Chest wall: No tenderness.  Abdominal:     General: Bowel sounds are normal. There is no distension.     Palpations: Abdomen is soft. There is no mass.     Tenderness: There is no abdominal tenderness. There is no right CVA tenderness, left CVA tenderness, guarding or rebound.  Musculoskeletal:        General: No swelling or tenderness. Normal range of motion.     Cervical back: Normal range of motion. No rigidity or tenderness.     Right lower leg: No edema.     Left lower leg: No edema.  Lymphadenopathy:     Cervical: No cervical adenopathy.  Skin:    General: Skin is warm and dry.     Coloration: Skin is not pale.     Findings: No bruising, erythema, lesion or rash.  Neurological:     Mental Status: She is alert and oriented to person, place, and time.     Cranial Nerves: No cranial nerve deficit.     Sensory: No sensory deficit.     Motor: No weakness.  Coordination: Coordination normal.     Gait: Gait normal.  Psychiatric:        Mood and Affect: Mood is anxious.        Speech: Speech normal.        Behavior: Behavior normal.        Thought Content: Thought content normal.        Judgment: Judgment normal.    Labs reviewed: Recent Labs    05/16/21 0924 07/27/21 1335  NA 135 135  K 4.3 3.8  CL 99 99  CO2 25 24  GLUCOSE 324* 395*  BUN 5* <5*  CREATININE 0.80 0.79  CALCIUM 10.1 9.8   Recent Labs    05/16/21 0924  AST 69*  ALT 59*  ALKPHOS 113  BILITOT 0.7  PROT 7.7  ALBUMIN 4.4   Recent Labs    05/16/21 0924 07/27/21 1335  WBC 8.3 8.9  NEUTROABS 5.5  --   HGB 13.2 13.9  HCT 39.6 42.5  MCV 82.0 81.3  PLT 325 376   Lab Results  Component Value Date   TSH 2.439 11/23/2014   Lab Results  Component Value Date   HGBA1C 6.6 03/28/2015   No results found for: CHOL, HDL, LDLCALC, LDLDIRECT, TRIG, CHOLHDL  Significant Diagnostic Results in last 30 days:  No results found.  Assessment/Plan 1. Encounter to establish care Advised to sign release of medical records from previous provider then will update immunization. Will also schedule for fasting labs has not had any labs done for one and a half years.   2. Essential hypertension B/p well controlled. - continue on metoprolol and Hydrochlorothiazide  - on statin but not Asprin   3. Gastroesophageal reflux disease without esophagitis Worst with certain foods  which she tries to avoid. - will start on omeprazole if symptoms persist   4. Generalized anxiety disorder States hydroxyzine ineffective  Continue on escitalopram  5. Acquired hypothyroidism Lab Results  Component Value Date   TSH 2.439 11/23/2014  Off levothyroxine for several years - TSH; Future  6. Mixed hyperlipidemia No latest LDL for review  - Lipid panel; Future  7. Impacted cerumen of left ear Debrox otic solution then follow up for ear lavage recommended but declined would like  ENT referral for evaluation ringing in the ear too.  - Ambulatory referral to ENT 8. Tinnitus of left ear Worst on left ear  - Ambulatory referral to ENT  9. Encounter for screening colonoscopy Asymptomatic. Has never had any colonoscopy  - Ambulatory referral to Gastroenterology  10. Breast cancer screening by mammogram Latest Mammogram was 2 yrs ago was normal  - MM DIGITAL SCREENING BILATERAL; Future  11. Urinary urgency Afebrile.no abdominal tenderness on exam  - Urine Culture - POC Urinalysis Dipstick indicates yellow cloudy urine positive for Glucose,protein but negative for Nitrites and Leukocytes.   12. Type 2 diabetes mellitus with hyperlipidemia (HCC) No home CBG readings for review. - continue on metformin  Used to be on OZempic  - Ambulatory referral to [facility] / creatinine urine ratio - CBC with Differential/Platelet; Future - CMP with eGFR([facility]); Future - TSH; Future - Lipid panel; Future - Hemoglobin A1c; Future  13. Chronic dryness of both eyes Continue on Systane eye drops    14. Inflammatory bowel disease Resolved with addition of bene fiber in diet   15. Seasonal allergies Continue on Flonase  OTC antihistamines as needed   16. Body mass index (BMI) of 30.0-30.9 in adult BMI 30.4  -  Dietary modification and exercise at least 3 times per week for 30 minutes advised.  Family/ staff Communication: Reviewed plan of care with patient verbalized understanding   Labs/tests ordered:  - Microalbumin / creatinine urine ratio - CBC with Differential/Platelet; Future - CMP with eGFR([facility]); Future - TSH; Future - Hemoglobin A1c; Future - Urine Culture - POC Urinalysis Dipstick  - MM DIGITAL SCREENING BILATERAL; Future  Next Appointment : 4 months for medical management of chronic issues.Fasting Lab one week or sooner     Sandrea Hughs, NP

## 2021-10-30 NOTE — Addendum Note (Signed)
Addended byMarlowe Sax C on: 10/30/2021 10:09 PM   Modules accepted: Orders

## 2021-10-31 LAB — MICROALBUMIN / CREATININE URINE RATIO
Creatinine, Urine: 131 mg/dL (ref 20–275)
Microalb Creat Ratio: 265 mcg/mg creat — ABNORMAL HIGH (ref <30)
Microalb, Ur: 34.7 mg/dL

## 2021-10-31 LAB — URINE CULTURE
MICRO NUMBER:: 12877754
Result:: NO GROWTH
SPECIMEN QUALITY:: ADEQUATE

## 2021-11-06 ENCOUNTER — Other Ambulatory Visit: Payer: Self-pay

## 2021-11-06 ENCOUNTER — Other Ambulatory Visit: Payer: 59

## 2021-11-06 ENCOUNTER — Telehealth: Payer: Self-pay | Admitting: *Deleted

## 2021-11-06 ENCOUNTER — Ambulatory Visit (INDEPENDENT_AMBULATORY_CARE_PROVIDER_SITE_OTHER): Payer: 59 | Admitting: Podiatry

## 2021-11-06 ENCOUNTER — Encounter: Payer: Self-pay | Admitting: Podiatry

## 2021-11-06 DIAGNOSIS — L853 Xerosis cutis: Secondary | ICD-10-CM | POA: Diagnosis not present

## 2021-11-06 DIAGNOSIS — E119 Type 2 diabetes mellitus without complications: Secondary | ICD-10-CM

## 2021-11-06 DIAGNOSIS — E1169 Type 2 diabetes mellitus with other specified complication: Secondary | ICD-10-CM

## 2021-11-06 DIAGNOSIS — E782 Mixed hyperlipidemia: Secondary | ICD-10-CM

## 2021-11-06 DIAGNOSIS — E1142 Type 2 diabetes mellitus with diabetic polyneuropathy: Secondary | ICD-10-CM

## 2021-11-06 DIAGNOSIS — E039 Hypothyroidism, unspecified: Secondary | ICD-10-CM

## 2021-11-06 MED ORDER — LISINOPRIL 5 MG PO TABS: 5.0000 mg | ORAL_TABLET | Freq: Every day | ORAL | 5 refills | Status: DC

## 2021-11-06 NOTE — Telephone Encounter (Signed)
-----   Message from Sandrea Hughs, NP sent at 11/03/2021  5:06 PM EST ----- Final urine culture negative for urinary tract infection. Protein in urine is moderately high recommend Lisinopril 5 mg tablet one by mouth daily.

## 2021-11-06 NOTE — Progress Notes (Signed)
Subjective: Janet Marquez presents today referred by Ngetich, Nelda Bucks, NP for diabetic foot evaluation.  Today, patient c/o for diabetic foot evaluation.  Patient relates 2 year history of diabetes.  Patient denies any history of foot wounds.  Patient relates symptoms of numbness, tingling, burning or pins/needle sensation in feet.  Patient does not routinely mointor blood glucose.  PCP is Ngetich, Dinah C, NP , and last visit was 10/30/2021.  Past Medical History:  Diagnosis Date   Acid reflux    Anemia    Anxiety    Diabetes mellitus ([place])    Hx of cardiovascular stress test    ETT-echo 3/16: Normal   Hyperlipidemia    Hypertension    Inflammatory bowel diseases (IBD)    Thyroid disease    Vertigo     Patient Active Problem List   Diagnosis Date Noted   Vitamin D deficiency 10/30/2021   Seasonal allergies 10/30/2021   Chronic dryness of both eyes 10/30/2021   Body mass index (BMI) of 30.0-30.9 in adult 10/30/2021   Generalized anxiety disorder 10/30/2021   Tinnitus of left ear 10/30/2021   Hyperlipidemia    Elevated liver function tests 11/11/2019   Costochondritis 01/14/2017   Fibroids 01/20/2015   Sinus tachycardia 11/23/2014   Chest pain 11/22/2014   Hypothyroidism 12/14/2009   DM2 (diabetes mellitus, type 2) ([place]) 12/14/2009   Anemia 12/14/2009   Essential hypertension 12/14/2009   GERD 12/14/2009    Past Surgical History:  Procedure Laterality Date   BILATERAL SALPINGECTOMY Left 01/20/2015   Procedure: LAPAROSCOPIC LEFT SALPINGECTOMY;  Surgeon: Everlene Farrier, MD;  Location: [facility];  Service: Gynecology;  Laterality: Left;   CESAREAN SECTION  2006   LAPAROSCOPIC ASSISTED VAGINAL HYSTERECTOMY N/A 01/20/2015   Procedure: LAPAROSCOPIC ASSISTED VAGINAL HYSTERECTOMY;  Surgeon: Everlene Farrier, MD;  Location: [facility];  Service: Gynecology;  Laterality: N/A;   MYOMECTOMY     2002    Current Outpatient Medications on File Prior to Visit  Medication Sig  Dispense Refill   atorvastatin (LIPITOR) 20 MG tablet Take 1 tablet (20 mg total) by mouth daily. 30 tablet 2   Cholecalciferol 1.25 MG (50000 UT) capsule Take 1 capsule by mouth once a week.     escitalopram (LEXAPRO) 10 MG tablet Take 1 tablet (10 mg total) by mouth daily. 30 tablet 2   fluticasone (FLONASE) 50 MCG/ACT nasal spray Place 1 spray into both nostrils as needed for allergies or rhinitis.     hydrochlorothiazide (HYDRODIURIL) 12.5 MG tablet Take 1 tablet (12.5 mg total) by mouth daily. 30 tablet 2   meclizine (ANTIVERT) 25 MG tablet Take 1 tablet (25 mg total) by mouth 3 (three) times daily as needed for dizziness. 30 tablet 0   metFORMIN (GLUCOPHAGE) 500 MG tablet Take 1 tablet (500 mg total) by mouth 2 (two) times daily with a meal. 60 tablet 1   metoprolol tartrate (LOPRESSOR) 100 MG tablet Take 1 tablet (100 mg total) by mouth 2 (two) times daily. 180 tablet 0   Polyethyl Glycol-Propyl Glycol (SYSTANE) 0.4-0.3 % SOLN Apply 1 drop to eye as needed.     No current facility-administered medications on file prior to visit.     Allergies  Allergen Reactions   Clarithromycin Nausea And Vomiting    Social History   Occupational History   Not on file  Tobacco Use   Smoking status: Never   Smokeless tobacco: Never  Vaping Use   Vaping Use: Never used  Substance and Sexual Activity  Alcohol use: Yes    Comment: occasionally   Drug use: Never   Sexual activity: Not on file    Family History  Problem Relation Age of Onset   Diabetes Mother    Hypertension Mother    Diverticulosis Mother    Arthritis Mother    Hypertension Maternal Grandmother    Stroke Maternal Grandmother    Dementia Maternal Grandmother    Cancer Maternal Grandfather    Heart attack Maternal Grandfather     Immunization History  Administered Date(s) Administered   Hepatitis B 08/26/2021   Influenza, Seasonal, Injecte, Preservative Fre 11/23/2014, 07/10/2015   Influenza,inj,Quad PF,6+ Mos  11/23/2014, 07/10/2015, 07/18/2021   Influenza-Unspecified 11/23/2014, 07/10/2015   PFIZER(Purple Top)SARS-COV-2 Vaccination 12/29/2019, 02/09/2020, 08/08/2020, 03/05/2021   PNEUMOCOCCAL CONJUGATE-20 07/18/2021   Pfizer Covid-19 Vaccine Bivalent Booster 44yrs & up 07/18/2021   Pneumococcal Polysaccharide-23 08/16/2016   Td 10/16/1999   Tdap 04/03/2013, 08/26/2021   Zoster Recombinat (Shingrix) 03/05/2021, 07/18/2021    Objective: There were no vitals filed for this visit.  Janet Marquez is a pleasant 51 y.o. female WD, WN in NAD. AAO X 3.  Vascular Examination: CFT immediate b/l LE. Palpable DP/PT pulses b/l LE. Digital hair present b/l. Skin temperature gradient WNL b/l. No pain with calf compression b/l. No edema noted b/l. No cyanosis or clubbing noted b/l LE.  Dermatological Examination: Pedal integument with normal turgor, texture and tone BLE. No open wounds b/l LE. No interdigital macerations noted b/l LE. Toenails 1-5 b/l well maintained with adequate length. No erythema, no edema, no drainage, no fluctuance. No hyperkeratotic nor porokeratotic lesions present on today's visit. Pedal skin noted to be dry b/l lower extremities.  Musculoskeletal Examination: Normal muscle strength 5/5 to all lower extremity muscle groups bilaterally. No pain, crepitus or joint limitation noted with ROM b/l LE. No gross bony pedal deformities b/l. Patient ambulates independently without assistive aids.  Footwear Assessment: Does the patient wear appropriate shoes? Yes. Does the patient need inserts/orthotics? No.  Neurological Examination: Protective sensation intact 5/5 intact bilaterally with 10g monofilament b/l. Vibratory sensation intact b/l. Proprioception intact bilaterally. Deep tendon reflexes normal b/l.   Assessment: 1. Diabetic peripheral neuropathy associated with type 2 diabetes mellitus ([place])   2. Xerosis cutis   3. Encounter for diabetic foot exam ([place])     ADA Risk  Categorization: Low Risk:  Patient has all of the following: Intact protective sensation No prior foot ulcer  No severe deformity Pedal pulses present  Plan: -Diabetic foot examination performed today. -Continue diabetic foot care principles: inspect feet daily, monitor glucose as recommended by PCP and/or Endocrinologist, and follow prescribed diet per PCP, Endocrinologist and/or dietician. -She will discuss neuropathy symptoms with PCP on next visit. -For dry skin, she is to apply CeraVe Healing Ointment once daily. -Patient/POA to call should there be question/concern in the interim.  Return in about 1 year (around 11/06/2022).  Marzetta Board, DPM

## 2021-11-06 NOTE — Patient Instructions (Addendum)
For extremely dry, cracked feet: moisturize feet once daily; do not apply between toes A. CeraVe Healing Ointment  If you have problems reaching your feet: apply to feet once daily; do not apply between toes A.  Eucerin Aquaphor Ointment Body Spray  B.  Vaseline Intensive Care Spray Moisturizer (Unscented,  [place]oa Radiant Spray or Aloe Smooth Spray)  Diabetes Mellitus and Foot Care Foot care is an important part of your health, especially when you have diabetes. Diabetes may cause you to have problems because of poor blood flow (circulation) to your feet and legs, which can cause your skin to: Become thinner and drier. Break more easily. Heal more slowly. Peel and crack. You may also have nerve damage (neuropathy) in your legs and feet, causing decreased feeling in them. This means that you may not notice minor injuries to your feet that could lead to more serious problems. Noticing and addressing any potential problems early is the best way to prevent future foot problems. How to care for your feet Foot hygiene  Wash your feet daily with warm water and mild soap. Do not use hot water. Then, pat your feet and the areas between your toes until they are completely dry. Do not soak your feet as this can dry your skin. Trim your toenails straight across. Do not dig under them or around the cuticle. File the edges of your nails with an emery board or nail file. Apply a moisturizing lotion or petroleum jelly to the skin on your feet and to dry, brittle toenails. Use lotion that does not contain alcohol and is unscented. Do not apply lotion between your toes. Shoes and socks Wear clean socks or stockings every day. Make sure they are not too tight. Do not wear knee-high stockings since they may decrease blood flow to your legs. Wear shoes that fit properly and have enough cushioning. Always look in your shoes before you put them on to be sure there are no objects inside. To break in new shoes, wear  them for just a few hours a day. This prevents injuries on your feet. Wounds, scrapes, corns, and calluses  Check your feet daily for blisters, cuts, bruises, sores, and redness. If you cannot see the bottom of your feet, use a mirror or ask someone for help. Do not cut corns or calluses or try to remove them with medicine. If you find a minor scrape, cut, or break in the skin on your feet, keep it and the skin around it clean and dry. You may clean these areas with mild soap and water. Do not clean the area with peroxide, alcohol, or iodine. If you have a wound, scrape, corn, or callus on your foot, look at it several times a day to make sure it is healing and not infected. Check for: Redness, swelling, or pain. Fluid or blood. Warmth. Pus or a bad smell. General tips Do not cross your legs. This may decrease blood flow to your feet. Do not use heating pads or hot water bottles on your feet. They may burn your skin. If you have lost feeling in your feet or legs, you may not know this is happening until it is too late. Protect your feet from hot and cold by wearing shoes, such as at the beach or on hot pavement. Schedule a complete foot exam at least once a year (annually) or more often if you have foot problems. Report any cuts, sores, or bruises to your health care provider immediately. Where  to find more information American Diabetes Association: www.diabetes.org Association of Diabetes Care & Education Specialists: www.diabeteseducator.org Contact a health care provider if: You have a medical condition that increases your risk of infection and you have any cuts, sores, or bruises on your feet. You have an injury that is not healing. You have redness on your legs or feet. You feel burning or tingling in your legs or feet. You have pain or cramps in your legs and feet. Your legs or feet are numb. Your feet always feel cold. You have pain around any toenails. Get help right away if: You  have a wound, scrape, corn, or callus on your foot and: You have pain, swelling, or redness that gets worse. You have fluid or blood coming from the wound, scrape, corn, or callus. Your wound, scrape, corn, or callus feels warm to the touch. You have pus or a bad smell coming from the wound, scrape, corn, or callus. You have a fever. You have a red line going up your leg. Summary Check your feet every day for blisters, cuts, bruises, sores, and redness. Apply a moisturizing lotion or petroleum jelly to the skin on your feet and to dry, brittle toenails. Wear shoes that fit properly and have enough cushioning. If you have foot problems, report any cuts, sores, or bruises to your health care provider immediately. Schedule a complete foot exam at least once a year (annually) or more often if you have foot problems. This information is not intended to replace advice given to you by your health care provider. Make sure you discuss any questions you have with your health care provider. Document Revised: 04/21/2020 Document Reviewed: 04/21/2020 Elsevier Patient Education  [place].  Diabetic Neuropathy Diabetic neuropathy refers to nerve damage that is caused by diabetes. Over time, people with diabetes can develop nerve damage throughout the body. There are several types of diabetic neuropathy: Peripheral neuropathy. This is the most common type of diabetic neuropathy. It damages the nerves that carry signals between the spinal cord and other parts of the body (peripheral nerves). This usually affects nerves in the feet, legs, hands, and arms. Autonomic neuropathy. This type causes damage to nerves that control involuntary functions (autonomic nerves). Involuntary functions are functions of the body that you do not control. They include heartbeat, body temperature, blood pressure, urination, digestion, sweating, sexual function, or response to changes in blood glucose. Focal neuropathy. This  type of nerve damage affects one area of the body, such as an arm, a leg, or the face. The injury may involve one nerve or a small group of nerves. Focal neuropathy can be painful and unpredictable. It occurs most often in older adults with diabetes. This often develops suddenly, but usually improves over time and does not cause long-term problems. Proximal neuropathy. This type of nerve damage affects the nerves of the thighs, hips, buttocks, or legs. It causes severe pain, weakness, and muscle death (atrophy), usually in the thigh muscles. It is more common among older men and people who have type 2 diabetes. The length of recovery time may vary. What are the causes? Peripheral, autonomic, and focal neuropathies are caused by diabetes that is not well controlled with treatment. The cause of proximal neuropathy is not known, but it may be caused by inflammation related to uncontrolled blood glucose levels. What are the signs or symptoms? Peripheral neuropathy Peripheral neuropathy develops slowly over time. When the nerves of the feet and legs no longer work, you may experience:  Burning, stabbing, or aching pain in the legs or feet. Pain or cramping in the legs or feet. Loss of feeling (numbness) and inability to feel pressure or pain in the feet. This can lead to: Thick calluses or sores on areas of constant pressure. Ulcers. Reduced ability to feel temperature changes. Foot deformities. Muscle weakness. Loss of balance or coordination. Autonomic neuropathy The symptoms of autonomic neuropathy vary depending on which nerves are affected. Symptoms may include: Problems with digestion, such as: Nausea or vomiting. Poor appetite. Bloating. Diarrhea or constipation. Trouble swallowing. Losing weight without trying to. Problems with the heart, blood, and lungs, such as: Dizziness, especially when standing up. Fainting. Shortness of breath. Irregular heartbeat. Bladder problems, such  as: Trouble starting or stopping urination. Leaking urine. Trouble emptying the bladder. Urinary tract infections (UTIs). Problems with other body functions, such as: Sweat. You may sweat too much or too little. Temperature. You might get hot easily. Or, you might feel cold more than usual. Sexual function. Men may not be able to get or maintain an erection. Women may have vaginal dryness and difficulty with arousal. Focal neuropathy Symptoms affect only one area of the body. Common symptoms include: Numbness. Tingling. Burning pain. Prickling feeling. Very sensitive skin. Weakness. Inability to move (paralysis). Muscle twitching. Muscles getting smaller (wasting). Poor coordination. Double or blurred vision. Proximal neuropathy Sudden, severe pain in the hip, thigh, or buttocks. Pain may spread from the back into the legs (sciatica). Pain and numbness in the arms and legs. Tingling. Loss of bladder control or bowel control. Weakness and wasting of thigh muscles. Difficulty getting up from a seated position. Abdominal swelling. Unexplained weight loss. How is this diagnosed? Diagnosis varies depending on the type of neuropathy your health care provider suspects. Peripheral neuropathy Your health care provider will do a neurologic exam. This exam checks your reflexes, how you move, and what you can feel. You may have other tests, such as: Blood tests. Tests of the fluid that surrounds the spinal cord (lumbar puncture). CT scan. MRI. Checking the nerves that control muscles (electromyogram, or EMG). Checking how quickly signals pass through your nerves (nerve conduction study). Checking a small piece of a nerve using a microscope (biopsy). Autonomic neuropathy You may have tests, such as: Tests to measure your blood pressure and heart rate. You may be secured to an exam table that moves you from a lying position to an upright position (table tilt test). Breathing tests to  check your lungs. Tests to check how food moves through the digestive system (gastric emptying tests). Blood, sweat, or urine tests. Ultrasound of your bladder. Spinal fluid tests. Focal neuropathy This condition may be diagnosed with: A neurologic exam. CT scan. MRI. EMG. Nerve conduction study. Proximal neuropathy There is no test to diagnose this type of neuropathy. You may have tests to rule out other possible causes of this type of neuropathy. Tests may include: X-rays of your spine and lumbar region. Lumbar puncture. MRI. How is this treated? The goal of treatment is to keep nerve damage from getting worse. Treatment may include: Following your diabetes management plan. This will help keep your blood glucose level and your A1C level within your target range. This is the most important treatment. Using prescription pain medicine. Follow these instructions at home: Diabetes management Follow your diabetes management plan as told by your health care provider. Check your blood glucose levels. Keep your blood glucose in your target range. Have your A1C level checked at least two times  a year, or as often as told. Take over the counter and prescription medicines only as told by your health care provider. This includes insulin and diabetes medicine.  Lifestyle  Do not use any products that contain nicotine or tobacco, such as cigarettes, e-cigarettes, and chewing tobacco. If you need help quitting, ask your health care provider. Be physically active every day. Include strength training and balance exercises. Follow a healthy meal plan. Work with your health care provider to manage your blood pressure. General instructions Ask your health care provider if the medicine prescribed to you requires you to avoid driving or using machinery. Check your skin and feet every day for cuts, bruises, redness, blisters, or sores. Keep all follow-up visits. This is important. Contact a health  care provider if: You have burning, stabbing, or aching pain in your legs or feet. You are unable to feel pressure or pain in your feet. You develop problems with digestion, such as: Nausea. Vomiting. Bloating. Constipation. Diarrhea. Abdominal pain. You have difficulty with urination, such as: Inability to control when you urinate (incontinence). Inability to completely empty the bladder (retention). You feel as if your heart is racing (palpitations). You feel dizzy, weak, or faint when you stand up. Get help right away if: You cannot urinate. You have sudden weakness or loss of coordination. You have trouble speaking. You have pain or pressure in your chest. You have an irregular heartbeat. You have sudden inability to move a part of your body. These symptoms may represent a serious problem that is an emergency. Do not wait to see if the symptoms will go away. Get medical help right away. Call your local emergency services (911 in the [place] Do not drive yourself to the hospital. Summary Diabetic neuropathy is nerve damage that is caused by diabetes. It can cause numbness and pain in the arms, legs, digestive tract, heart, and other body systems. This condition is treated by keeping your blood glucose level and your A1C level within your target range. This can help prevent neuropathy from getting worse. Check your skin and feet every day for cuts, bruises, redness, blisters, or sores. Do not use any products that contain nicotine or tobacco, such as cigarettes, e-cigarettes, and chewing tobacco. This information is not intended to replace advice given to you by your health care provider. Make sure you discuss any questions you have with your health care provider. Document Revised: 02/11/2020 Document Reviewed: 02/11/2020 Elsevier Patient Education  [place].

## 2021-11-06 NOTE — Telephone Encounter (Signed)
Patient notified and agreed.  °Rx sent to pharmacy.  °

## 2021-11-07 LAB — CBC WITH DIFFERENTIAL/PLATELET
Absolute Monocytes: 460 cells/uL (ref 200–950)
Basophils Absolute: 28 cells/uL (ref 0–200)
Basophils Relative: 0.3 %
Eosinophils Absolute: 83 cells/uL (ref 15–500)
Eosinophils Relative: 0.9 %
HCT: 44.6 % (ref 35.0–45.0)
Hemoglobin: 14.5 g/dL (ref 11.7–15.5)
Lymphs Abs: 2972 cells/uL (ref 850–3900)
MCH: 26.9 pg — ABNORMAL LOW (ref 27.0–33.0)
MCHC: 32.5 g/dL (ref 32.0–36.0)
MCV: 82.6 fL (ref 80.0–100.0)
MPV: 10.2 fL (ref 7.5–12.5)
Monocytes Relative: 5 %
Neutro Abs: 5658 cells/uL (ref 1500–7800)
Neutrophils Relative %: 61.5 %
Platelets: 392 10*3/uL (ref 140–400)
RBC: 5.4 10*6/uL — ABNORMAL HIGH (ref 3.80–5.10)
RDW: 13.1 % (ref 11.0–15.0)
Total Lymphocyte: 32.3 %
WBC: 9.2 10*3/uL (ref 3.8–10.8)

## 2021-11-07 LAB — COMPLETE METABOLIC PANEL WITH GFR
AG Ratio: 1.7 (calc) (ref 1.0–2.5)
ALT: 51 U/L — ABNORMAL HIGH (ref 6–29)
AST: 49 U/L — ABNORMAL HIGH (ref 10–35)
Albumin: 5.1 g/dL (ref 3.6–5.1)
Alkaline phosphatase (APISO): 134 U/L (ref 37–153)
BUN: 9 mg/dL (ref 7–25)
CO2: 30 mmol/L (ref 20–32)
Calcium: 10.7 mg/dL — ABNORMAL HIGH (ref 8.6–10.4)
Chloride: 97 mmol/L — ABNORMAL LOW (ref 98–110)
Creat: 0.76 mg/dL (ref 0.50–1.03)
Globulin: 3 g/dL (calc) (ref 1.9–3.7)
Glucose, Bld: 282 mg/dL — ABNORMAL HIGH (ref 65–99)
Potassium: 4.3 mmol/L (ref 3.5–5.3)
Sodium: 136 mmol/L (ref 135–146)
Total Bilirubin: 0.5 mg/dL (ref 0.2–1.2)
Total Protein: 8.1 g/dL (ref 6.1–8.1)
eGFR: 95 mL/min/{1.73_m2} (ref >=60)

## 2021-11-07 LAB — LIPID PANEL
Cholesterol: 205 mg/dL — ABNORMAL HIGH (ref <200)
HDL: 45 mg/dL — ABNORMAL LOW (ref >=50)
LDL Cholesterol (Calc): 134 mg/dL (calc) — ABNORMAL HIGH
Non-HDL Cholesterol (Calc): 160 mg/dL (calc) — ABNORMAL HIGH (ref <130)
Total CHOL/HDL Ratio: 4.6 (calc) (ref <5.0)
Triglycerides: 131 mg/dL (ref <150)

## 2021-11-07 LAB — HEMOGLOBIN A1C
Hgb A1c MFr Bld: 11.9 % of total Hgb — ABNORMAL HIGH (ref <5.7)
Mean Plasma Glucose: 295 mg/dL
eAG (mmol/L): 16.3 mmol/L

## 2021-11-07 LAB — TSH: TSH: 5.95 mIU/L — ABNORMAL HIGH

## 2021-11-14 ENCOUNTER — Encounter: Payer: Self-pay | Admitting: Family

## 2021-11-14 ENCOUNTER — Other Ambulatory Visit: Payer: Self-pay

## 2021-11-14 ENCOUNTER — Telehealth (INDEPENDENT_AMBULATORY_CARE_PROVIDER_SITE_OTHER): Payer: 59 | Admitting: Family

## 2021-11-14 DIAGNOSIS — E1165 Type 2 diabetes mellitus with hyperglycemia: Secondary | ICD-10-CM

## 2021-11-14 DIAGNOSIS — E782 Mixed hyperlipidemia: Secondary | ICD-10-CM | POA: Diagnosis not present

## 2021-11-14 DIAGNOSIS — R0981 Nasal congestion: Secondary | ICD-10-CM

## 2021-11-14 MED ORDER — FLUTICASONE PROPIONATE 50 MCG/ACT NA SUSP: 1.0000 | NASAL | 5 refills | Status: DC | PRN

## 2021-11-14 MED ORDER — INSULIN GLARGINE 100 UNITS/ML SOLOSTAR PEN: 10.0000 [IU] | PEN_INJECTOR | Freq: Every day | SUBCUTANEOUS | 5 refills | Status: DC

## 2021-11-14 MED ORDER — ATORVASTATIN CALCIUM 40 MG PO TABS: 40.0000 mg | ORAL_TABLET | Freq: Every day | ORAL | 2 refills | Status: DC

## 2021-11-14 NOTE — Progress Notes (Signed)
This service is provided via telemedicine  No vital signs collected/recorded due to the encounter was a telemedicine visit.   Location of patient (ex: home, work):  Home  Patient consents to a telephone visit:  Yes  Location of the provider (ex: office, home):  [employer].  Name of any referring provider:  Ader Fritze, Nelda Bucks, NP   Names of all persons participating in the telemedicine service and their role in the encounter: Patient, Janet Marquez, [place], [place], Webb Silversmith, NP.    Time spent on call: 8 minutes spent on the phone with Medical Assistant.      Provider: Marlowe Sax FNP-C  Ylianna Almanzar, Nelda Bucks, NP  Patient Care Team: Mirl Hillery, Nelda Bucks, NP as PCP - General (Family Medicine) Janith Lima, MD  Extended Emergency Contact Information Primary Emergency Contact: Janet Marquez Address: [address]          [place], [place] [address] Johnnette Litter of Janet Marquez Phone: [phone] Relation: Mother Secondary Emergency Contact: Janet Marquez Address: [address]          [place],  [address] Johnnette Litter of [place] Phone: [phone] Relation: Aunt  Code Status:  full Code  Goals of care: Advanced Directive information Advanced Directives 11/14/2021  Does Patient Have a Medical Advance Directive? No  Would patient like information on creating a medical advance directive? No - Patient declined     Chief Complaint  Patient presents with   Acute Visit    Discuss lab results.     HPI:  Pt is a 51 y.o. female seen today for an acute visit to discuss abnormal recent  lab results.Labs reviewed and discussed. All labs unremarkable except Hgb A 1 C was 11.9 states previous A 1 C was one year ago was 7.9.Has an old glucometer but no strips and lancet.she will send name of glucometer on Mychart for provider to refill strips and lancets. She denies any signs of hyperglycemia.  Total cholesterol 205,TRG 131,LDL 134 currently on Atorvastatin 20 mg  tablet daily.Not exercising as she used to on treadmill due to the cold weather since her Treadmill is outside on the porch.   TSH level also slightly high. history of hypothyroidism noted on chart review but not on levothyroxine.will monitor for now.     Past Medical History:  Diagnosis Date   Acid reflux    Anemia    Anxiety    Diabetes mellitus ([place])    Hx of cardiovascular stress test    ETT-echo 3/16: Normal   Hyperlipidemia    Hypertension    Inflammatory bowel diseases (IBD)    Thyroid disease    Vertigo    Past Surgical History:  Procedure Laterality Date   BILATERAL SALPINGECTOMY Left 01/20/2015   Procedure: LAPAROSCOPIC LEFT SALPINGECTOMY;  Surgeon: Everlene Farrier, MD;  Location: [facility];  Service: Gynecology;  Laterality: Left;   CESAREAN SECTION  2006   LAPAROSCOPIC ASSISTED VAGINAL HYSTERECTOMY N/A 01/20/2015   Procedure: LAPAROSCOPIC ASSISTED VAGINAL HYSTERECTOMY;  Surgeon: Everlene Farrier, MD;  Location: [facility];  Service: Gynecology;  Laterality: N/A;   MYOMECTOMY     2002    Allergies  Allergen Reactions   Clarithromycin Nausea And Vomiting    Outpatient Encounter Medications as of 11/14/2021  Medication Sig   Cholecalciferol 1.25 MG (50000 UT) capsule Take 1 capsule by mouth once a week.   escitalopram (LEXAPRO) 10 MG tablet Take 1 tablet (10 mg total) by mouth daily.   hydrochlorothiazide (HYDRODIURIL) 12.5 MG tablet Take  1 tablet (12.5 mg total) by mouth daily.   insulin glargine (LANTUS) 100 unit/mL SOPN Inject 10 Units into the skin at bedtime.   lisinopril (ZESTRIL) 5 MG tablet Take 1 tablet (5 mg total) by mouth daily.   meclizine (ANTIVERT) 25 MG tablet Take 1 tablet (25 mg total) by mouth 3 (three) times daily as needed for dizziness.   metFORMIN (GLUCOPHAGE) 500 MG tablet Take 1 tablet (500 mg total) by mouth 2 (two) times daily with a meal.   metoprolol tartrate (LOPRESSOR) 100 MG tablet Take 1 tablet (100 mg total) by mouth 2 (two) times daily.    Polyethyl Glycol-Propyl Glycol (SYSTANE) 0.4-0.3 % SOLN Apply 1 drop to eye as needed.   [DISCONTINUED] atorvastatin (LIPITOR) 20 MG tablet Take 1 tablet (20 mg total) by mouth daily.   [DISCONTINUED] fluticasone (FLONASE) 50 MCG/ACT nasal spray Place 1 spray into both nostrils as needed for allergies or rhinitis.   atorvastatin (LIPITOR) 40 MG tablet Take 1 tablet (40 mg total) by mouth daily.   fluticasone (FLONASE) 50 MCG/ACT nasal spray Place 1 spray into both nostrils as needed for allergies or rhinitis.   No facility-administered encounter medications on file as of 11/14/2021.    Review of Systems  Constitutional:  Negative for appetite change, chills, fatigue and fever.  HENT:  Positive for congestion. Negative for postnasal drip, rhinorrhea, sinus pressure, sinus pain, sneezing and sore throat.   Eyes:  Negative for pain, discharge, itching and visual disturbance.  Respiratory:  Negative for cough, chest tightness, shortness of breath and wheezing.   Cardiovascular:  Negative for chest pain, palpitations and leg swelling.  Gastrointestinal:  Negative for abdominal distention, abdominal pain, constipation, diarrhea, nausea and vomiting.  Endocrine: Negative for cold intolerance, heat intolerance, polydipsia, polyphagia and polyuria.  Skin:  Negative for color change, pallor and rash.  Neurological:  Negative for dizziness, syncope, weakness, light-headedness, numbness and headaches.   Immunization History  Administered Date(s) Administered   Hepatitis B 08/26/2021   Influenza, Seasonal, Injecte, Preservative Fre 11/23/2014, 07/10/2015   Influenza,inj,Quad PF,6+ Mos 11/23/2014, 07/10/2015, 07/18/2021   Influenza-Unspecified 11/23/2014, 07/10/2015   PFIZER(Purple Top)SARS-COV-2 Vaccination 12/29/2019, 02/09/2020, 08/08/2020, 03/05/2021   PNEUMOCOCCAL CONJUGATE-20 07/18/2021   Pfizer Covid-19 Vaccine Bivalent Booster 18yrs & up 07/18/2021   Pneumococcal Polysaccharide-23 08/16/2016    Td 10/16/1999   Tdap 04/03/2013, 08/26/2021   Zoster Recombinat (Shingrix) 03/05/2021, 07/18/2021   Pertinent  Health Maintenance Due  Topic Date Due   OPHTHALMOLOGY EXAM  Never done   COLONOSCOPY (Pts 45-34yrs Insurance coverage will need to be confirmed)  Never done   PAP SMEAR-Modifier  12/07/2017   MAMMOGRAM  05/07/2020   HEMOGLOBIN A1C  05/06/2022   FOOT EXAM  11/06/2022   INFLUENZA VACCINE  Completed   Fall Risk 05/16/2021 05/16/2021 07/27/2021 10/30/2021 11/14/2021  Falls in the past year? - - - 0 0  Was there an injury with Fall? - - - 0 0  Fall Risk Category Calculator - - - 0 0  Fall Risk Category - - - Low Low  Patient Fall Risk Level Low fall risk Moderate fall risk Low fall risk Low fall risk Low fall risk  Patient at Risk for Falls Due to - - - No Fall Risks No Fall Risks  Fall risk Follow up - - - Falls evaluation completed Falls evaluation completed   Functional Status Survey:    There were no vitals filed for this visit. There is no height or weight on file to calculate  BMI. Physical Exam Constitutional:      General: She is not in acute distress.    Appearance: She is not ill-appearing.  Pulmonary:     Effort: Pulmonary effort is normal. No respiratory distress.  Neurological:     Mental Status: She is alert and oriented to person, place, and time.  Psychiatric:        Mood and Affect: Mood normal.        Behavior: Behavior normal.    Labs reviewed: Recent Labs    05/16/21 0924 07/27/21 1335 11/06/21 0836  NA 135 135 136  K 4.3 3.8 4.3  CL 99 99 97*  CO2 25 24 30   GLUCOSE 324* 395* 282*  BUN 5* <5* 9  CREATININE 0.80 0.79 0.76  CALCIUM 10.1 9.8 10.7*   Recent Labs    05/16/21 0924 11/06/21 0836  AST 69* 49*  ALT 59* 51*  ALKPHOS 113  --   BILITOT 0.7 0.5  PROT 7.7 8.1  ALBUMIN 4.4  --    Recent Labs    05/16/21 0924 07/27/21 1335 11/06/21 0836  WBC 8.3 8.9 9.2  NEUTROABS 5.5  --  5,658  HGB 13.2 13.9 14.5  HCT 39.6 42.5 44.6  MCV  82.0 81.3 82.6  PLT 325 376 392   Lab Results  Component Value Date   TSH 5.95 (H) 11/06/2021   Lab Results  Component Value Date   HGBA1C 11.9 (H) 11/06/2021   Lab Results  Component Value Date   CHOL 205 (H) 11/06/2021   HDL 45 (L) 11/06/2021   LDLCALC 134 (H) 11/06/2021   TRIG 131 11/06/2021   CHOLHDL 4.6 11/06/2021    Significant Diagnostic Results in last 30 days:  No results found.  Assessment/Plan 1. Type 2 diabetes mellitus with hyperglycemia, without long-term current use of insulin (HCC) Lab Results  Component Value Date   HGBA1C 11.9 (H) 11/06/2021  No home CBG  Request refill on strips and lancet has not used glucometer for several months might need a new machine.she will check on her glucometer then will send glucometer's name on MyChart for provider to refill supplies. - will start on Lantus.she is familiar with giving self  SQ injection states was on Ozempic but did not like side effects.   - advised to notify provider for CBG < 75 or > 150  - Dietary modification and exercise at least 3 times per week for 30 minutes  - additional DASH Eating plan Education information provided on AVS   - insulin glargine (LANTUS) 100 unit/mL SOPN; Inject 10 Units into the skin at bedtime.  Dispense: 15 mL; Refill: 5  2. Mixed hyperlipidemia LDL not at goal - Increase Atorvastatin from 20 mg tablet to 40 mg tablet daily  - atorvastatin (LIPITOR) 40 MG tablet; Take 1 tablet (40 mg total) by mouth daily.  Dispense: 30 tablet; Refill: 2  3. Nasal congestion Request flonase refill. - fluticasone (FLONASE) 50 MCG/ACT nasal spray; Place 1 spray into both nostrils as needed for allergies or rhinitis.  Dispense: 16 g; Refill: 5   Family/ staff Communication: Reviewed plan of care with patient verbalized understanding   Labs/tests ordered: will recheck CMP,lipid and A1C in 4 months will order lab work during her 2 weeks follow up.   Next Appointment: 2 weeks for blood sugar  evaluation   I connected with  Grace Isaac on 11/14/21 by a video enabled telemedicine application and verified that I am speaking with the correct person  using two identifiers.   I discussed the limitations of evaluation and management by telemedicine. The patient expressed understanding and agreed to proceed.   Spent 15 minutes of  face to face with patient  >50% time spent counseling; reviewing medical record; tests; labs; and developing future plan of care.   Sandrea Hughs, NP

## 2021-11-29 ENCOUNTER — Ambulatory Visit (INDEPENDENT_AMBULATORY_CARE_PROVIDER_SITE_OTHER): Payer: 59 | Admitting: Adult Health

## 2021-11-29 ENCOUNTER — Encounter: Payer: Self-pay | Admitting: Adult Health

## 2021-11-29 ENCOUNTER — Other Ambulatory Visit: Payer: Self-pay

## 2021-11-29 VITALS — BP 110/80 | HR 86 | Temp 97.3°F | Resp 16 | Ht 67.5 in | Wt 190.8 lb

## 2021-11-29 DIAGNOSIS — E1165 Type 2 diabetes mellitus with hyperglycemia: Secondary | ICD-10-CM

## 2021-11-29 LAB — GLUCOSE, POCT (MANUAL RESULT ENTRY): POC Glucose: 286 mg/dl — AB (ref 70–99)

## 2021-11-29 MED ORDER — LANTUS SOLOSTAR 100 UNIT/ML ~~LOC~~ SOPN: 20.0000 [IU] | PEN_INJECTOR | Freq: Every day | SUBCUTANEOUS | 2 refills | Status: DC

## 2021-11-29 MED ORDER — PEN NEEDLES 32G X 4 MM MISC: 1.0000 | Freq: Four times a day (QID) | 11 refills | Status: DC

## 2021-11-29 NOTE — Progress Notes (Signed)
Location:  [facility] of Service:   clinic    CODE STATUS: full   Allergies  Allergen Reactions   Clarithromycin Nausea And Vomiting    Chief Complaint  Patient presents with   Follow-up    2 week follow up on blood sugar.    HPI:  She is here to be seen for her 2 week diabetic follow up. She had been prescribed lantus 10 units nightly. She tells me that she has been taking her lantus every night. She had been taking her cbg nightly. She tells me that all of her readings are elevated. She did not know when to take her cbg. She has excessive hunger; thirst and urination. She feels fatigued all the time.  We have discussed what her AM readings should be; we discussed signs and symptoms of hypoglycemia. She has verbalized understanding.   Past Medical History:  Diagnosis Date   Acid reflux    Anemia    Anxiety    Diabetes mellitus ([place])    Hx of cardiovascular stress test    ETT-echo 3/16: Normal   Hyperlipidemia    Hypertension    Inflammatory bowel diseases (IBD)    Thyroid disease    Vertigo     Past Surgical History:  Procedure Laterality Date   BILATERAL SALPINGECTOMY Left 01/20/2015   Procedure: LAPAROSCOPIC LEFT SALPINGECTOMY;  Surgeon: Everlene Farrier, MD;  Location: [facility];  Service: Gynecology;  Laterality: Left;   CESAREAN SECTION  2006   LAPAROSCOPIC ASSISTED VAGINAL HYSTERECTOMY N/A 01/20/2015   Procedure: LAPAROSCOPIC ASSISTED VAGINAL HYSTERECTOMY;  Surgeon: Everlene Farrier, MD;  Location: [facility];  Service: Gynecology;  Laterality: N/A;   MYOMECTOMY     2002    Social History   Socioeconomic History   Marital status: Single    Spouse name: Not on file   Number of children: Not on file   Years of education: Not on file   Highest education level: Not on file  Occupational History   Not on file  Tobacco Use   Smoking status: Never   Smokeless tobacco: Never  Vaping Use   Vaping Use: Never used  Substance and Sexual Activity    Alcohol use: Yes    Comment: occasionally   Drug use: Never   Sexual activity: Not on file  Other Topics Concern   Not on file  Social History Narrative   Diet:       Caffeine:yes      Married, if yes what year: Single      Do you live in a house, apartment, assisted living, condo, trailer, ect: House      Is it one or more stories: 1      How many persons live in your home? 3      Pets: No      Highest level or education completed: some college      Current/Past profession: claims specialist       Exercise: Yes                 Type and how often: ride bike, cubii, treadmill few times a week         Living Will: No   DNR: No   POA/HPOA: No      Functional Status:   Do you have difficulty bathing or dressing yourself? No   Do you have difficulty preparing food or eating? No   Do you have difficulty managing your medications? No  Do you have difficulty managing your finances? No   Do you have difficulty affording your medications? Yes   Social Determinants of Health   Financial Resource Strain: Not on file  Food Insecurity: Not on file  Transportation Needs: Not on file  Physical Activity: Not on file  Stress: Not on file  Social Connections: Not on file  Intimate Partner Violence: Not on file   Family History  Problem Relation Age of Onset   Diabetes Mother    Hypertension Mother    Diverticulosis Mother    Arthritis Mother    Hypertension Maternal Grandmother    Stroke Maternal Grandmother    Dementia Maternal Grandmother    Cancer Maternal Grandfather    Heart attack Maternal Grandfather       VITAL SIGNS BP 110/80    Pulse 86    Temp (!) 97.3 F (36.3 C)    Resp 16    Ht 5' 7.5" (1.715 m)    Wt 190 lb 12.8 oz (86.5 kg)    LMP 11/21/2014    SpO2 98%    BMI 29.44 kg/m   Outpatient Encounter Medications as of 11/29/2021  Medication Sig   atorvastatin (LIPITOR) 40 MG tablet Take 1 tablet (40 mg total) by mouth daily.   Cholecalciferol 1.25 MG  (50000 UT) capsule Take 1 capsule by mouth once a week.   escitalopram (LEXAPRO) 10 MG tablet Take 1 tablet (10 mg total) by mouth daily.   fluticasone (FLONASE) 50 MCG/ACT nasal spray Place 1 spray into both nostrils as needed for allergies or rhinitis.   hydrochlorothiazide (HYDRODIURIL) 12.5 MG tablet Take 1 tablet (12.5 mg total) by mouth daily.   insulin glargine (LANTUS) 100 unit/mL SOPN Inject 10 Units into the skin at bedtime.   lisinopril (ZESTRIL) 5 MG tablet Take 1 tablet (5 mg total) by mouth daily.   meclizine (ANTIVERT) 25 MG tablet Take 1 tablet (25 mg total) by mouth 3 (three) times daily as needed for dizziness.   metFORMIN (GLUCOPHAGE) 500 MG tablet Take 1 tablet (500 mg total) by mouth 2 (two) times daily with a meal.   metoprolol tartrate (LOPRESSOR) 100 MG tablet Take 1 tablet (100 mg total) by mouth 2 (two) times daily.   Polyethyl Glycol-Propyl Glycol (SYSTANE) 0.4-0.3 % SOLN Apply 1 drop to eye as needed.   No facility-administered encounter medications on file as of 11/29/2021.     SIGNIFICANT DIAGNOSTIC EXAMS  LABS REVIEWED:   11-06-21: HGB A1C 11.9   Review of Systems  Constitutional:  Positive for malaise/fatigue.  Respiratory:  Negative for cough and shortness of breath.   Cardiovascular:  Negative for chest pain, palpitations and leg swelling.  Gastrointestinal:  Negative for abdominal pain, constipation and heartburn.  Musculoskeletal:  Negative for back pain, joint pain and myalgias.  Skin: Negative.   Neurological:  Negative for dizziness.  Endo/Heme/Allergies:  Positive for polydipsia.  Psychiatric/Behavioral:  The patient is not nervous/anxious.    Physical Exam Constitutional:      General: She is not in acute distress.    Appearance: She is well-developed. She is not diaphoretic.  Neck:     Thyroid: No thyromegaly.  Cardiovascular:     Rate and Rhythm: Normal rate and regular rhythm.     Pulses: Normal pulses.     Heart sounds: Normal heart  sounds.  Pulmonary:     Effort: Pulmonary effort is normal. No respiratory distress.     Breath sounds: Normal breath sounds.  Abdominal:  General: Bowel sounds are normal. There is no distension.     Palpations: Abdomen is soft.     Tenderness: There is no abdominal tenderness.  Musculoskeletal:        General: Normal range of motion.     Cervical back: Neck supple.     Right lower leg: No edema.     Left lower leg: No edema.  Lymphadenopathy:     Cervical: No cervical adenopathy.  Skin:    General: Skin is warm and dry.  Neurological:     Mental Status: She is alert and oriented to person, place, and time.  Psychiatric:        Mood and Affect: Mood normal.     ASSESSMENT/ PLAN:  TODAY  Type 2 diabetes mellitus with hyperglycemia with long term current use of insulin.   Has not improved: will increase lantus to 20 units nightly. She will check cbg twice daily.  She has been told to avoid carbohydrates and to limited to 1 serving with each meal. She has verbalized understanding. Will have her return in 2 weeks.     Ok Edwards NP [facility]   [phone]

## 2021-11-29 NOTE — Patient Instructions (Signed)
Please avoid potatoes and bread  Allow one carbohydrate with meals Focus eating more upon vegetables  Take blood sugar in the morning and at bedtime.

## 2021-11-30 ENCOUNTER — Encounter: Payer: Self-pay | Admitting: Family

## 2021-12-13 ENCOUNTER — Ambulatory Visit (INDEPENDENT_AMBULATORY_CARE_PROVIDER_SITE_OTHER): Payer: 59 | Admitting: Family

## 2021-12-13 ENCOUNTER — Other Ambulatory Visit: Payer: Self-pay

## 2021-12-13 ENCOUNTER — Encounter: Payer: Self-pay | Admitting: Family

## 2021-12-13 VITALS — BP 138/88 | HR 92 | Temp 97.5°F | Resp 16 | Ht 67.5 in | Wt 195.0 lb

## 2021-12-13 DIAGNOSIS — E1165 Type 2 diabetes mellitus with hyperglycemia: Secondary | ICD-10-CM | POA: Diagnosis not present

## 2021-12-13 LAB — GLUCOSE, POCT (MANUAL RESULT ENTRY): POC Glucose: 243 mg/dl — AB (ref 70–99)

## 2021-12-13 MED ORDER — METFORMIN HCL 500 MG PO TABS: 1000.0000 mg | ORAL_TABLET | Freq: Two times a day (BID) | ORAL | 1 refills | Status: DC

## 2021-12-13 MED ORDER — LANTUS SOLOSTAR 100 UNIT/ML ~~LOC~~ SOPN: 22.0000 [IU] | PEN_INJECTOR | Freq: Every day | SUBCUTANEOUS | 2 refills | Status: DC

## 2021-12-13 NOTE — Progress Notes (Signed)
Provider: Marlowe Sax FNP-C  Sherida Dobkins, Nelda Bucks, NP  Patient Care Team: Kahmari Koller, Nelda Bucks, NP as PCP - General (Family Medicine) Janith Lima, MD  Extended Emergency Contact Information Primary Emergency Contact: [facility] Address: [address]          [place], [place] [address] Johnnette Litter of [place] Phone: [phone] Relation: Mother Secondary Emergency Contact: Elta Guadeloupe Address: [address]          [place], [place] [address] Johnnette Litter of [place] Phone: [phone] Relation: Aunt  Code Status:  Full code  Goals of care: Advanced Directive information Advanced Directives 12/13/2021  Does Patient Have a Medical Advance Directive? No  Would patient like information on creating a medical advance directive? No - Patient declined     Chief Complaint  Patient presents with   Follow-up    2 week follow up on diabetes.     HPI:  Pt is a 51 y.o. female seen today for an acute visit for 2 weeks follow up high blood sugar.she was here 11/29/2021 seen by [facility] Hgb A 1 C was 11.9  Her lantus was increased to 20 units and advised to follow up today. She brought her CBG log today readings in the morning 142 ,151 but the rest of the readings are 180's - 200's .Her afternoon blood sugars are in the 200's - 500's.states since she works from home does not really move around much like she used to while in the office.she likes to bake and snacks on unhealthy snacks.eats around 6: 30 pm then eats snack at bedtime. 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    Past Medical History:  Diagnosis Date   Acid reflux    Anemia    Anxiety    Diabetes mellitus ([place])    Hx of cardiovascular stress test    ETT-echo 3/16: Normal   Hyperlipidemia    Hypertension    Inflammatory bowel diseases (IBD)    Thyroid disease    Vertigo     Past Surgical History:  Procedure Laterality Date   BILATERAL SALPINGECTOMY Left 01/20/2015   Procedure: LAPAROSCOPIC LEFT SALPINGECTOMY;  Surgeon: Everlene Farrier, MD;  Location: [facility];  Service: Gynecology;  Laterality: Left;   CESAREAN SECTION  2006   LAPAROSCOPIC ASSISTED VAGINAL HYSTERECTOMY N/A 01/20/2015   Procedure: LAPAROSCOPIC ASSISTED VAGINAL HYSTERECTOMY;  Surgeon: Everlene Farrier, MD;  Location: [facility];  Service: Gynecology;  Laterality: N/A;   MYOMECTOMY     2002    Allergies  Allergen Reactions   Clarithromycin Nausea And Vomiting    Outpatient Encounter Medications as of 12/13/2021  Medication Sig   atorvastatin (LIPITOR) 40 MG tablet Take 1 tablet (40 mg total) by mouth daily.   Cholecalciferol 1.25 MG (50000 UT) capsule Take 1 capsule by mouth once a week.   escitalopram (LEXAPRO) 10 MG tablet Take 1 tablet (10 mg total) by mouth daily.   fluticasone (FLONASE) 50 MCG/ACT nasal spray Place 1 spray into both nostrils as needed for allergies or rhinitis.   hydrochlorothiazide (HYDRODIURIL) 12.5 MG tablet Take 1 tablet (12.5 mg total) by mouth daily.   insulin glargine (LANTUS SOLOSTAR) 100 UNIT/ML Solostar Pen Inject 20 Units into the skin at bedtime.   Insulin Pen Needle (PEN NEEDLES) 32G X 4 MM MISC 1 Piece by Does  not apply route in the morning, at noon, in the evening, and at bedtime.   lisinopril (ZESTRIL) 5 MG tablet Take 1 tablet (5 mg total) by mouth daily.   meclizine (ANTIVERT) 25 MG tablet Take 1 tablet (25 mg total) by mouth 3 (three) times daily as needed for dizziness.   metFORMIN (GLUCOPHAGE) 500 MG tablet Take 1 tablet (500 mg total) by mouth 2 (two) times daily with a meal.   metoprolol tartrate (LOPRESSOR) 100 MG tablet Take 1 tablet (100 mg total) by mouth 2 (two) times daily.   Polyethyl Glycol-Propyl Glycol (SYSTANE) 0.4-0.3 % SOLN Apply 1 drop to eye as needed.   No facility-administered encounter medications on file as of 12/13/2021.    Review of  Systems  Constitutional:  Negative for appetite change, chills, fatigue, fever and unexpected weight change.  HENT:  Negative for congestion, dental problem, ear discharge, ear pain, facial swelling, hearing loss, nosebleeds, postnasal drip, rhinorrhea, sinus pressure, sinus pain, sneezing, sore throat, tinnitus and trouble swallowing.   Eyes:  Negative for pain, discharge, redness, itching and visual disturbance.  Respiratory:  Negative for cough, chest tightness, shortness of breath and wheezing.   Cardiovascular:  Negative for chest pain, palpitations and leg swelling.  Gastrointestinal:  Negative for abdominal distention, blood in stool, constipation, diarrhea, nausea and vomiting.       Left abdomen tenderness awaiting appointment with GI  Endocrine: Negative for cold intolerance, heat intolerance, polydipsia, polyphagia and polyuria.  Genitourinary:  Negative for difficulty urinating, dysuria, flank pain, frequency and urgency.  Skin:  Negative for color change, pallor and rash.  Neurological:  Negative for dizziness, syncope, speech difficulty, weakness, light-headedness, numbness and headaches.   Immunization History  Administered Date(s) Administered   Hepatitis B 08/26/2021   Influenza, Seasonal, Injecte, Preservative Fre 11/23/2014, 07/10/2015   Influenza,inj,Quad PF,6+ Mos 11/23/2014, 07/10/2015, 07/18/2021   Influenza-Unspecified 11/23/2014, 07/10/2015   PFIZER(Purple Top)SARS-COV-2 Vaccination 12/29/2019, 02/09/2020, 08/08/2020, 03/05/2021   PNEUMOCOCCAL CONJUGATE-20 07/18/2021   Pfizer Covid-19 Vaccine Bivalent Booster 14yrs & up 07/18/2021   Pneumococcal Polysaccharide-23 08/16/2016   Td 10/16/1999   Tdap 04/03/2013, 08/26/2021   Zoster Recombinat (Shingrix) 03/05/2021, 07/18/2021   Pertinent  Health Maintenance Due  Topic Date Due   OPHTHALMOLOGY EXAM  Never done   COLONOSCOPY (Pts 45-66yrs Insurance coverage will need to be confirmed)  Never done   PAP SMEAR-Modifier   12/07/2017   MAMMOGRAM  05/07/2020   HEMOGLOBIN A1C  05/06/2022   FOOT EXAM  11/06/2022   INFLUENZA VACCINE  Completed   Fall Risk 07/27/2021 10/30/2021 11/14/2021 11/29/2021 12/13/2021  Falls in the past year? - 0 0 0 0  Was there an injury with Fall? - 0 0 0 0  Fall Risk Category Calculator - 0 0 0 0  Fall Risk Category - Low Low Low Low  Patient Fall Risk Level Low fall risk Low fall risk Low fall risk Low fall risk Low fall risk  Patient at Risk for Falls Due to - No Fall Risks No Fall Risks No Fall Risks No Fall Risks  Fall risk Follow up - Falls evaluation completed Falls evaluation completed Falls evaluation completed Falls evaluation completed   Functional Status Survey:    Vitals:   12/13/21 1449  BP: 138/88  Pulse: 92  Resp: 16  Temp: (!) 97.5 F (36.4 C)  SpO2: 97%  Weight: 195 lb (88.5 kg)  Height: 5' 7.5" (1.715 m)   Body mass index is 30.09 kg/m. Physical Exam Vitals reviewed.  Constitutional:      General: She is not in acute distress.    Appearance: Normal appearance. She is normal weight. She is not ill-appearing or diaphoretic.  HENT:     Mouth/Throat:     Mouth: Mucous membranes are moist.     Pharynx: Oropharynx is clear. No oropharyngeal exudate or posterior oropharyngeal erythema.  Eyes:     General: No scleral icterus.       Right eye: No discharge.        Left eye: No discharge.     Extraocular Movements: Extraocular movements intact.     Conjunctiva/sclera: Conjunctivae normal.     Pupils: Pupils are equal, round, and reactive to light.  Cardiovascular:     Rate and Rhythm: Normal rate and regular rhythm.     Pulses: Normal pulses.     Heart sounds: Normal heart sounds. No murmur heard.   No friction rub. No gallop.  Pulmonary:     Effort: Pulmonary effort is normal. No respiratory distress.     Breath sounds: Normal breath sounds. No wheezing, rhonchi or rales.  Chest:     Chest wall: No tenderness.  Abdominal:     General: Bowel sounds  are normal. There is no distension.     Palpations: Abdomen is soft. There is no mass.     Tenderness: There is no abdominal tenderness. There is no right CVA tenderness, left CVA tenderness, guarding or rebound.  Skin:    General: Skin is warm and dry.     Coloration: Skin is not pale.     Findings: No bruising, erythema, lesion or rash.  Neurological:     Mental Status: She is alert and oriented to person, place, and time.     Motor: No weakness.     Gait: Gait normal.  Psychiatric:        Mood and Affect: Mood normal.        Speech: Speech normal.        Behavior: Behavior normal.        Thought Content: Thought content normal.        Judgment: Judgment normal.    Labs reviewed: Recent Labs    05/16/21 0924 07/27/21 1335 11/06/21 0836  NA 135 135 136  K 4.3 3.8 4.3  CL 99 99 97*  CO2 25 24 30   GLUCOSE 324* 395* 282*  BUN 5* <5* 9  CREATININE 0.80 0.79 0.76  CALCIUM 10.1 9.8 10.7*   Recent Labs    05/16/21 0924 11/06/21 0836  AST 69* 49*  ALT 59* 51*  ALKPHOS 113  --   BILITOT 0.7 0.5  PROT 7.7 8.1  ALBUMIN 4.4  --    Recent Labs    05/16/21 0924 07/27/21 1335 11/06/21 0836  WBC 8.3 8.9 9.2  NEUTROABS 5.5  --  5,658  HGB 13.2 13.9 14.5  HCT 39.6 42.5 44.6  MCV 82.0 81.3 82.6  PLT 325 376 392   Lab Results  Component Value Date   TSH 5.95 (H) 11/06/2021   Lab Results  Component Value Date   HGBA1C 11.9 (H) 11/06/2021   Lab Results  Component Value Date   CHOL 205 (H) 11/06/2021   HDL 45 (L) 11/06/2021   LDLCALC 134 (H) 11/06/2021   TRIG 131 11/06/2021   CHOLHDL 4.6 11/06/2021    Significant Diagnostic Results in last 30 days:  No results found.  Assessment/Plan  Type 2 diabetes mellitus with hyperglycemia, without long-term current use of insulin (  [place]) Fasting and post prandial readings still high  - Dietary modification and exercise at least 3 times per week for 30 minutes advised. - Increase Lantus from 20 units to 22 units daily at  bedtime  - also increase metformin to 1000 mg tablet one by mouth twice daily  Check CBG twice daily and record  - follow up in 2 weeks for evaluation if CBG still > 200 consider short acting Insulin  - will refer to diabetic Education  - POC Glucose (CBG) - metFORMIN (GLUCOPHAGE) 500 MG tablet; Take 2 tablets (1,000 mg total) by mouth 2 (two) times daily with a meal.  Dispense: 60 tablet; Refill: 1 - insulin glargine (LANTUS SOLOSTAR) 100 UNIT/ML Solostar Pen; Inject 22 Units into the skin at bedtime.  Dispense: 15 mL; Refill: 2 - Ambulatory referral to diabetic education  Family/ staff Communication: Reviewed plan of care with patient verbalized understanding   Labs/tests ordered: None   Next Appointment: 2 weeks follow up Blood sugar   Ivaan Liddy C Signa Cheek, NP

## 2021-12-26 DIAGNOSIS — E1165 Type 2 diabetes mellitus with hyperglycemia: Secondary | ICD-10-CM

## 2021-12-27 ENCOUNTER — Other Ambulatory Visit: Payer: Self-pay

## 2021-12-27 ENCOUNTER — Telehealth: Payer: 59 | Admitting: Family

## 2021-12-27 MED ORDER — LANTUS SOLOSTAR 100 UNIT/ML ~~LOC~~ SOPN: 26.0000 [IU] | PEN_INJECTOR | Freq: Every day | SUBCUTANEOUS | 2 refills | Status: DC

## 2021-12-27 NOTE — Telephone Encounter (Signed)
Blood sugars still high both in the morning and evening.Increase Lantus injection from 22 units daily to 26 units SQ daily.  ?

## 2021-12-29 ENCOUNTER — Other Ambulatory Visit: Payer: Self-pay

## 2021-12-29 ENCOUNTER — Encounter: Payer: Self-pay | Admitting: Family

## 2021-12-29 ENCOUNTER — Encounter: Payer: 59 | Admitting: Family

## 2021-12-29 VITALS — BP 160/110 | HR 95 | Temp 96.2°F | Resp 16 | Ht 67.5 in | Wt 197.6 lb

## 2021-12-29 DIAGNOSIS — E1165 Type 2 diabetes mellitus with hyperglycemia: Secondary | ICD-10-CM

## 2021-12-29 LAB — GLUCOSE, POCT (MANUAL RESULT ENTRY): POC Glucose: 125 mg/dl — AB (ref 70–99)

## 2021-12-29 NOTE — Progress Notes (Signed)
This encounter was created in error - please disregard. ?Patient left prior to provider seeing her.Provider was with another patient.  ?

## 2022-02-05 ENCOUNTER — Other Ambulatory Visit: Payer: Self-pay | Admitting: Family

## 2022-02-05 DIAGNOSIS — E1165 Type 2 diabetes mellitus with hyperglycemia: Secondary | ICD-10-CM

## 2022-02-05 DIAGNOSIS — I10 Essential (primary) hypertension: Secondary | ICD-10-CM

## 2022-02-28 ENCOUNTER — Encounter: Payer: Self-pay | Admitting: Family

## 2022-02-28 ENCOUNTER — Encounter: Payer: 59 | Admitting: Family

## 2022-02-28 NOTE — Progress Notes (Signed)
? ?  This encounter was created in error - please disregard. ?Np show  ?

## 2022-03-29 ENCOUNTER — Ambulatory Visit
Admission: RE | Admit: 2022-03-29 | Discharge: 2022-03-29 | Disposition: A | Discharge status: Home / Self Care | Payer: 59 | Source: Ambulatory Visit | Attending: Family | Admitting: Family

## 2022-03-29 DIAGNOSIS — Z1231 Encounter for screening mammogram for malignant neoplasm of breast: Secondary | ICD-10-CM

## 2022-04-04 ENCOUNTER — Other Ambulatory Visit: Payer: Self-pay | Admitting: Family

## 2022-04-04 DIAGNOSIS — R2231 Localized swelling, mass and lump, right upper limb: Secondary | ICD-10-CM

## 2022-05-22 ENCOUNTER — Ambulatory Visit
Admission: RE | Admit: 2022-05-22 | Discharge: 2022-05-22 | Disposition: A | Discharge status: Home / Self Care | Payer: 59 | Source: Ambulatory Visit | Attending: Family | Admitting: Family

## 2022-05-22 DIAGNOSIS — R2231 Localized swelling, mass and lump, right upper limb: Secondary | ICD-10-CM

## 2022-06-05 ENCOUNTER — Other Ambulatory Visit: Payer: Self-pay | Admitting: Family

## 2022-06-05 DIAGNOSIS — E782 Mixed hyperlipidemia: Secondary | ICD-10-CM

## 2022-06-05 DIAGNOSIS — E1165 Type 2 diabetes mellitus with hyperglycemia: Secondary | ICD-10-CM

## 2022-06-05 NOTE — Telephone Encounter (Signed)
Call placed to patient to inform her that she is overdue for an appointment, notation was also made on refills.  No answer and voicemail was full. Mychart message sent as well informing patient that she needs to contact the office to schedule a routine follow-up.

## 2022-06-08 ENCOUNTER — Encounter: Payer: Self-pay | Admitting: Family

## 2022-06-08 ENCOUNTER — Ambulatory Visit (INDEPENDENT_AMBULATORY_CARE_PROVIDER_SITE_OTHER): Payer: 59 | Admitting: Family

## 2022-06-08 VITALS — BP 138/98 | HR 96 | Temp 96.1°F | Resp 16 | Ht 67.5 in | Wt 199.4 lb

## 2022-06-08 DIAGNOSIS — F411 Generalized anxiety disorder: Secondary | ICD-10-CM

## 2022-06-08 DIAGNOSIS — E782 Mixed hyperlipidemia: Secondary | ICD-10-CM

## 2022-06-08 DIAGNOSIS — E039 Hypothyroidism, unspecified: Secondary | ICD-10-CM

## 2022-06-08 DIAGNOSIS — I10 Essential (primary) hypertension: Secondary | ICD-10-CM | POA: Diagnosis not present

## 2022-06-08 DIAGNOSIS — E559 Vitamin D deficiency, unspecified: Secondary | ICD-10-CM

## 2022-06-08 DIAGNOSIS — K219 Gastro-esophageal reflux disease without esophagitis: Secondary | ICD-10-CM

## 2022-06-08 DIAGNOSIS — E1165 Type 2 diabetes mellitus with hyperglycemia: Secondary | ICD-10-CM | POA: Diagnosis not present

## 2022-06-08 DIAGNOSIS — R0981 Nasal congestion: Secondary | ICD-10-CM

## 2022-06-08 LAB — GLUCOSE, POCT (MANUAL RESULT ENTRY): POC Glucose: 193 mg/dl — AB (ref 70–99)

## 2022-06-08 MED ORDER — LISINOPRIL 5 MG PO TABS: 5.0000 mg | ORAL_TABLET | Freq: Every day | ORAL | 1 refills | Status: DC

## 2022-06-08 MED ORDER — ESCITALOPRAM OXALATE 10 MG PO TABS
10.0000 mg | ORAL_TABLET | Freq: Every day | ORAL | 0 refills | Status: DC
Start: 2022-06-08 — End: 2022-12-20

## 2022-06-08 MED ORDER — HYDROCHLOROTHIAZIDE 12.5 MG PO TABS: 12.5000 mg | ORAL_TABLET | Freq: Every day | ORAL | 1 refills | Status: DC

## 2022-06-08 MED ORDER — FLUTICASONE PROPIONATE 50 MCG/ACT NA SUSP: 1.0000 | NASAL | 5 refills | Status: DC | PRN

## 2022-06-08 MED ORDER — CHOLECALCIFEROL 1.25 MG (50000 UT) PO CAPS: 50000.0000 [IU] | ORAL_CAPSULE | ORAL | 1 refills | Status: DC

## 2022-06-08 MED ORDER — ATORVASTATIN CALCIUM 40 MG PO TABS: 40.0000 mg | ORAL_TABLET | Freq: Every day | ORAL | 1 refills | Status: DC

## 2022-06-08 MED ORDER — LORATADINE 10 MG PO TABS: 10.0000 mg | ORAL_TABLET | Freq: Every day | ORAL | 11 refills | Status: DC

## 2022-06-08 MED ORDER — METOPROLOL TARTRATE 100 MG PO TABS: 100.0000 mg | ORAL_TABLET | Freq: Two times a day (BID) | ORAL | 1 refills | Status: DC

## 2022-06-08 NOTE — Progress Notes (Signed)
Provider: Marlowe Sax FNP-C   Cherylanne Ardelean, Nelda Bucks, NP  Patient Care Team: Shivaun Bilello, Nelda Bucks, NP as PCP - General (Family Medicine) Janith Lima, MD  Extended Emergency Contact Information Primary Emergency Contact: [facility] Address: [address]          [place], [place] [address] Johnnette Litter of [place] Phone: [phone] Relation: Mother Secondary Emergency Contact: Elta Guadeloupe Address: [address]          [place], [place] [address] Johnnette Litter of [place] Phone: [phone] Relation: Aunt  Code Status:  Full Code  Goals of care: Advanced Directive information    02/28/2022   11:07 AM  Advanced Directives  Does Patient Have a Medical Advance Directive? No  Would patient like information on creating a medical advance directive? No - Patient declined     Chief Complaint  Patient presents with  . Medical Management of Chronic Issues    Diabetes maintenance.     HPI:  Pt is a 51 y.o. female seen today for 6 months follow up for medical management of chronic diseases.    Type 2 DM - previous A 1 C 11.9 (11/06/2021).glucose 193 today.CBG at home 190's -low 200's.Has been out of metfromin for couple of days. Continues to use Lantus 26 thinks her blood sugar were better controlled while she was on Ozempic.she had some acid reflux thought it was from [place] though still has similar symptoms even after stopping Ozempic.she will like to restart on Ozempic.Discuss checking A1C then restart if indicated.    Hypertension - CR 0.76 states has been out of B/p medication.Does not check B/p at home.Has had some little dizziness saw by ENT due to left ear impaction with some buzzing   Hyperlipidemia - previous Chol 205,LDL 134 and TRG 131   GERD - symptoms are on and off.not taking Omeprazole.   Hypothyroidism - latest TSH 5.95 she was on levothyroxine after delivery of her son but was taken off by previous provider since TSH level were normal.    Generalized  anxiety - takes lexapro as needed.does not like to take medication.   Nasal congestion -ongoing since April on and off. On Flonase   Past Medical History:  Diagnosis Date  . Acid reflux   . Anemia   . Anxiety   . Diabetes mellitus ([place])   . Hx of cardiovascular stress test    ETT-echo 3/16: Normal  . Hyperlipidemia   . Hypertension   . Inflammatory bowel diseases (IBD)   . Thyroid disease   . Vertigo    Past Surgical History:  Procedure Laterality Date  . BILATERAL SALPINGECTOMY Left 01/20/2015   Procedure: LAPAROSCOPIC LEFT SALPINGECTOMY;  Surgeon: Everlene Farrier, MD;  Location: [facility];  Service: Gynecology;  Laterality: Left;  . CESAREAN SECTION  2006  . LAPAROSCOPIC ASSISTED VAGINAL HYSTERECTOMY N/A 01/20/2015   Procedure: LAPAROSCOPIC ASSISTED VAGINAL HYSTERECTOMY;  Surgeon: Everlene Farrier, MD;  Location: [facility];  Service: Gynecology;  Laterality: N/A;  . MYOMECTOMY     2002    Allergies  Allergen Reactions  . Clarithromycin Nausea And Vomiting    Allergies as of 06/08/2022       Reactions   Clarithromycin Nausea And Vomiting        Medication List        Accurate as of June 08, 2022  9:34 AM. If you have any questions, ask your nurse or doctor.          atorvastatin 40 MG tablet Commonly  known as: LIPITOR Take 1 tablet (40 mg total) by mouth daily. APPOINTMENT OVERDUE   Cholecalciferol 1.25 MG (50000 UT) capsule Take 1 capsule by mouth once a week.   escitalopram 10 MG tablet Commonly known as: LEXAPRO Take 1 tablet (10 mg total) by mouth daily.   fluticasone 50 MCG/ACT nasal spray Commonly known as: FLONASE Place 1 spray into both nostrils as needed for allergies or rhinitis.   hydrochlorothiazide 12.5 MG tablet Commonly known as: HYDRODIURIL Take 1 tablet by mouth once daily   Lantus SoloStar 100 UNIT/ML Solostar Pen Generic drug: insulin glargine Inject 26 Units into the skin at bedtime.   lisinopril 5 MG tablet Commonly known as:  ZESTRIL Take 1 tablet (5 mg total) by mouth daily.   meclizine 25 MG tablet Commonly known as: ANTIVERT Take 1 tablet (25 mg total) by mouth 3 (three) times daily as needed for dizziness.   metFORMIN 500 MG tablet Commonly known as: GLUCOPHAGE Take 2 tablets (1,000 mg total) by mouth 2 (two) times daily with a meal. APPOINTMENT OVERDUE   metoprolol tartrate 100 MG tablet Commonly known as: LOPRESSOR Take 1 tablet by mouth twice daily   Pen Needles 32G X 4 MM Misc 1 Piece by Does not apply route in the morning, at noon, in the evening, and at bedtime.   Systane 0.4-0.3 % Soln Generic drug: Polyethyl Glycol-Propyl Glycol Apply 1 drop to eye as needed.        Review of Systems  Constitutional:  Negative for appetite change, chills, fatigue, fever and unexpected weight change.  HENT:  Negative for congestion, dental problem, ear discharge, ear pain, facial swelling, hearing loss, nosebleeds, postnasal drip, rhinorrhea, sinus pressure, sinus pain, sneezing, sore throat, tinnitus and trouble swallowing.   Eyes:  Negative for pain, discharge, redness, itching and visual disturbance.  Respiratory:  Negative for cough, chest tightness, shortness of breath and wheezing.   Cardiovascular:  Negative for chest pain, palpitations and leg swelling.  Gastrointestinal:  Negative for abdominal distention, abdominal pain, blood in stool, constipation, diarrhea, nausea and vomiting.  Endocrine: Negative for cold intolerance, heat intolerance, polydipsia, polyphagia and polyuria.  Genitourinary:  Negative for difficulty urinating, dysuria, flank pain, frequency and urgency.  Musculoskeletal:  Negative for arthralgias, back pain, gait problem, joint swelling, myalgias, neck pain and neck stiffness.  Skin:  Negative for color change, pallor, rash and wound.  Neurological:  Negative for dizziness, syncope, speech difficulty, weakness, light-headedness, numbness and headaches.  Hematological:  Does not  bruise/bleed easily.  Psychiatric/Behavioral:  Negative for agitation, behavioral problems, confusion, hallucinations, self-injury, sleep disturbance and suicidal ideas. The patient is not nervous/anxious.     Immunization History  Administered Date(s) Administered  . Hepatitis B 08/26/2021  . Influenza, Seasonal, Injecte, Preservative Fre 11/23/2014, 07/10/2015  . Influenza,inj,Quad PF,6+ Mos 11/23/2014, 07/10/2015, 07/18/2021  . Influenza-Unspecified 11/23/2014, 07/10/2015, 08/03/2016  . PFIZER(Purple Top)SARS-COV-2 Vaccination 12/29/2019, 02/09/2020, 08/08/2020, 03/05/2021  . PNEUMOCOCCAL CONJUGATE-20 07/18/2021  . [employer] 8yrs & up 07/18/2021  . Pneumococcal Polysaccharide-23 08/16/2016  . Td 10/16/1999  . Tdap 04/03/2013, 08/26/2021  . Zoster Recombinat (Shingrix) 03/05/2021, 07/18/2021   Pertinent  Health Maintenance Due  Topic Date Due  . OPHTHALMOLOGY EXAM  Never done  . COLONOSCOPY (Pts 45-55yrs Insurance coverage will need to be confirmed)  Never done  . PAP SMEAR-Modifier  12/07/2017  . HEMOGLOBIN A1C  05/06/2022  . INFLUENZA VACCINE  05/15/2022  . FOOT EXAM  11/06/2022  . MAMMOGRAM  05/22/2024  11/29/2021   11:33 AM 12/13/2021   10:22 AM 12/29/2021    2:08 PM 02/28/2022   11:06 AM 06/08/2022    9:33 AM  Fall Risk  Falls in the past year? 0 0 0 0 0  Was there an injury with Fall? 0 0 0 0 0  Fall Risk Category Calculator 0 0 0 0 0  Fall Risk Category Low Low Low Low Low  Patient Fall Risk Level Low fall risk Low fall risk Low fall risk  Low fall risk  Patient at Risk for Falls Due to No Fall Risks No Fall Risks No Fall Risks No Fall Risks No Fall Risks  Fall risk Follow up Falls evaluation completed Falls evaluation completed Falls evaluation completed Falls evaluation completed Falls evaluation completed   Functional Status Survey:    Vitals:   06/08/22 0920  BP: (!) 138/98  Pulse: 96  Resp: 16  Temp: (!) 96.1 F (35.6 C)   SpO2: 98%  Weight: 199 lb 6.4 oz (90.4 kg)  Height: 5' 7.5" (1.715 m)   Body mass index is 30.77 kg/m. Physical Exam Vitals reviewed.  Constitutional:      General: She is not in acute distress.    Appearance: Normal appearance. She is obese. She is not ill-appearing or diaphoretic.  HENT:     Head: Normocephalic.     Right Ear: Tympanic membrane, ear canal and external ear normal. There is no impacted cerumen.     Left Ear: Tympanic membrane, ear canal and external ear normal. There is no impacted cerumen.     Nose: Nose normal. No congestion or rhinorrhea.     Mouth/Throat:     Mouth: Mucous membranes are moist.     Pharynx: Oropharynx is clear. No oropharyngeal exudate or posterior oropharyngeal erythema.  Eyes:     General: No scleral icterus.       Right eye: No discharge.        Left eye: No discharge.     Extraocular Movements: Extraocular movements intact.     Conjunctiva/sclera: Conjunctivae normal.     Pupils: Pupils are equal, round, and reactive to light.  Neck:     Vascular: No carotid bruit.  Cardiovascular:     Rate and Rhythm: Normal rate and regular rhythm.     Pulses: Normal pulses.     Heart sounds: Normal heart sounds. No murmur heard.    No friction rub. No gallop.  Pulmonary:     Effort: Pulmonary effort is normal. No respiratory distress.     Breath sounds: Normal breath sounds. No wheezing, rhonchi or rales.  Chest:     Chest wall: No tenderness.  Abdominal:     General: Bowel sounds are normal. There is no distension.     Palpations: Abdomen is soft. There is no mass.     Tenderness: There is no abdominal tenderness. There is no right CVA tenderness, left CVA tenderness, guarding or rebound.  Musculoskeletal:        General: No swelling or tenderness. Normal range of motion.     Cervical back: Normal range of motion. No rigidity or tenderness.     Right lower leg: No edema.     Left lower leg: No edema.  Lymphadenopathy:     Cervical: No  cervical adenopathy.  Skin:    General: Skin is warm and dry.     Coloration: Skin is not pale.     Findings: No bruising, erythema, lesion or rash.  Neurological:  Mental Status: She is alert and oriented to person, place, and time.     Cranial Nerves: No cranial nerve deficit.     Sensory: No sensory deficit.     Motor: No weakness.     Coordination: Coordination normal.     Gait: Gait normal.  Psychiatric:        Mood and Affect: Mood normal.        Speech: Speech normal.        Behavior: Behavior normal.        Thought Content: Thought content normal.        Judgment: Judgment normal.    Labs reviewed: Recent Labs    07/27/21 1335 11/06/21 0836  NA 135 136  K 3.8 4.3  CL 99 97*  CO2 24 30  GLUCOSE 395* 282*  BUN <5* 9  CREATININE 0.79 0.76  CALCIUM 9.8 10.7*   Recent Labs    11/06/21 0836  AST 49*  ALT 51*  BILITOT 0.5  PROT 8.1   Recent Labs    07/27/21 1335 11/06/21 0836  WBC 8.9 9.2  NEUTROABS  --  5,658  HGB 13.9 14.5  HCT 42.5 44.6  MCV 81.3 82.6  PLT 376 392   Lab Results  Component Value Date   TSH 5.95 (H) 11/06/2021   Lab Results  Component Value Date   HGBA1C 11.9 (H) 11/06/2021   Lab Results  Component Value Date   CHOL 205 (H) 11/06/2021   HDL 45 (L) 11/06/2021   LDLCALC 134 (H) 11/06/2021   TRIG 131 11/06/2021   CHOLHDL 4.6 11/06/2021    Significant Diagnostic Results in last 30 days:  MM DIAG BREAST TOMO BILATERAL  Result Date: 05/22/2022 CLINICAL DATA:  51 year old female presenting for evaluation of an enlarging lump in the right axilla. EXAM: DIGITAL DIAGNOSTIC BILATERAL MAMMOGRAM WITH TOMOSYNTHESIS; ULTRASOUND RIGHT BREAST LIMITED TECHNIQUE: Bilateral digital diagnostic mammography and breast tomosynthesis was performed.; Targeted ultrasound examination of the right breast was performed COMPARISON:  Previous exam(s). ACR Breast Density Category c: The breast tissue is heterogeneously dense, which may obscure small  masses. FINDINGS: Mammogram: Right breast: A skin BB marks the palpable site of concern reported by the patient in the right axilla. A spot tangential view of this area was performed in addition to standard views. There is no new abnormality at the palpable site or elsewhere in the right breast to suggest the presence of malignancy. Left breast: No suspicious mass, distortion, or microcalcifications are identified to suggest presence of malignancy. On physical exam I feel a soft bulge in the right axilla which is similar though somewhat bigger compared to the left. Ultrasound: Targeted ultrasound is performed at the palpable site of concern in the right axilla demonstrating no cystic or solid mass. There is only normal fat density tissue. IMPRESSION: 1. At the palpable site of concern in the right axilla there is a benign fat pad. 2.  No mammographic evidence of malignancy bilaterally. RECOMMENDATION: Screening mammogram in one year.(Code:SM-B-01Y) I have discussed the findings and recommendations with the patient. If applicable, a reminder letter will be sent to the patient regarding the next appointment. BI-RADS CATEGORY  2: Benign. Electronically Signed   By: Audie Pinto M.D.   On: 05/22/2022 15:08   US BREAST LTD UNI RIGHT INC AXILLA  Result Date: 05/22/2022 CLINICAL DATA:  51 year old female presenting for evaluation of an enlarging lump in the right axilla. EXAM: DIGITAL DIAGNOSTIC BILATERAL MAMMOGRAM WITH TOMOSYNTHESIS; ULTRASOUND RIGHT BREAST LIMITED TECHNIQUE:  Bilateral digital diagnostic mammography and breast tomosynthesis was performed.; Targeted ultrasound examination of the right breast was performed COMPARISON:  Previous exam(s). ACR Breast Density Category c: The breast tissue is heterogeneously dense, which may obscure small masses. FINDINGS: Mammogram: Right breast: A skin BB marks the palpable site of concern reported by the patient in the right axilla. A spot tangential view of this area  was performed in addition to standard views. There is no new abnormality at the palpable site or elsewhere in the right breast to suggest the presence of malignancy. Left breast: No suspicious mass, distortion, or microcalcifications are identified to suggest presence of malignancy. On physical exam I feel a soft bulge in the right axilla which is similar though somewhat bigger compared to the left. Ultrasound: Targeted ultrasound is performed at the palpable site of concern in the right axilla demonstrating no cystic or solid mass. There is only normal fat density tissue. IMPRESSION: 1. At the palpable site of concern in the right axilla there is a benign fat pad. 2.  No mammographic evidence of malignancy bilaterally. RECOMMENDATION: Screening mammogram in one year.(Code:SM-B-01Y) I have discussed the findings and recommendations with the patient. If applicable, a reminder letter will be sent to the patient regarding the next appointment. BI-RADS CATEGORY  2: Benign. Electronically Signed   By: Audie Pinto M.D.   On: 05/22/2022 15:08   Assessment/Plan  1. Type 2 diabetes mellitus with hyperglycemia, without long-term current use of insulin (HCC) Lab Results  Component Value Date   HGBA1C 11.9 (H) 11/06/2021  -Continue on metformin and Lantus -Monofilament foot sensation intact no ulceration noted - POC Glucose (CBG) - TSH - Hemoglobin A1c  2. Mixed hyperlipidemia LDL not at goal Dietary modification and exercise at least 3 times per week for 30 minutes advised Continue on atorvastatin 40 mg daily no side effects reported. - atorvastatin (LIPITOR) 40 MG tablet; Take 1 tablet (40 mg total) by mouth daily. APPOINTMENT OVERDUE  Dispense: 90 tablet; Refill: 1 - Lipid panel  3. Essential hypertension Blood pressure stable Continue on lisinopril and hydrochlorothiazide - metoprolol tartrate (LOPRESSOR) 100 MG tablet; Take 1 tablet (100 mg total) by mouth 2 (two) times daily.  Dispense: 180  tablet; Refill: 1 - hydrochlorothiazide (HYDRODIURIL) 12.5 MG tablet; Take 1 tablet (12.5 mg total) by mouth daily.  Dispense: 90 tablet; Refill: 1 - TSH - COMPLETE METABOLIC PANEL WITH GFR - CBC with Differential/Platelet  4. Acquired hypothyroidism Stable Will obtain TSH level 5. Gastroesophageal reflux disease without esophagitis Stable  Continue to avoid aggravating foods and species. - CBC with Differential/Platelet  6. Generalized anxiety disorder Stable - continue on lexapro  - escitalopram (LEXAPRO) 10 MG tablet; Take 1 tablet (10 mg total) by mouth daily.  Dispense: 90 tablet; Refill: 0  7. Nasal congestion Chronic  Advised to use flonase  - fluticasone (FLONASE) 50 MCG/ACT nasal spray; Place 1 spray into both nostrils as needed for allergies or rhinitis.  Dispense: 16 g; Refill: 5 - loratadine (CLARITIN) 10 MG tablet; Take 1 tablet (10 mg total) by mouth daily.  Dispense: 30 tablet; Refill: 11  8. Vitamin D deficiency Continue on vitamin D supplement  - Cholecalciferol 1.25 MG (50000 UT) capsule; Take 1 capsule (50,000 Units total) by mouth once a week.  Dispense: 6 capsule; Refill: 1 - Vitamin D, 1,25-dihydroxy   Family/ staff Communication: Reviewed plan of care with patient verbalized understanding  Labs/tests ordered:  - CBC with Differential/Platelet - CMP with eGFR([facility]) - TSH -  Hgb A1C - Lipid panel - Vitamin D, 1,25-dihydroxy   Next Appointment : Return in about 6 months (around 12/09/2022) for medical mangement of chronic issues.Sandrea Hughs, NP

## 2022-06-11 ENCOUNTER — Telehealth: Payer: Self-pay

## 2022-06-11 ENCOUNTER — Encounter: Payer: Self-pay | Admitting: Family

## 2022-06-11 ENCOUNTER — Telehealth (INDEPENDENT_AMBULATORY_CARE_PROVIDER_SITE_OTHER): Payer: 59 | Admitting: Family

## 2022-06-11 DIAGNOSIS — U071 COVID-19: Secondary | ICD-10-CM

## 2022-06-11 DIAGNOSIS — E782 Mixed hyperlipidemia: Secondary | ICD-10-CM | POA: Diagnosis not present

## 2022-06-11 DIAGNOSIS — R7989 Other specified abnormal findings of blood chemistry: Secondary | ICD-10-CM | POA: Diagnosis not present

## 2022-06-11 DIAGNOSIS — J069 Acute upper respiratory infection, unspecified: Secondary | ICD-10-CM

## 2022-06-11 MED ORDER — ACETAMINOPHEN 500 MG PO TABS: 500.0000 mg | ORAL_TABLET | Freq: Three times a day (TID) | ORAL | 0 refills | Status: AC | PRN

## 2022-06-11 MED ORDER — GUAIFENESIN ER 600 MG PO TB12: 600.0000 mg | ORAL_TABLET | Freq: Two times a day (BID) | ORAL | 0 refills | Status: AC

## 2022-06-11 MED ORDER — ZINC GLUCONATE 50 MG PO TABS: 50.0000 mg | ORAL_TABLET | Freq: Every day | ORAL | 0 refills | Status: AC

## 2022-06-11 MED ORDER — NIRMATRELVIR/RITONAVIR (PAXLOVID)TABLET: 3.0000 | ORAL_TABLET | Freq: Two times a day (BID) | ORAL | 0 refills | Status: AC

## 2022-06-11 NOTE — Patient Instructions (Signed)
-   Take Zinc 50 mg tablet one by mouth daily for 14 days  - Take Vitamin C 500 mg tablet one by mouth twice daily x 14 days  - continue on vitamin D supplement  - Tylenol as needed for fever or body aches  - over the counter Mucinex as needed for cough  - increase your fruits intake in your diet  - increase your water intake to 6-8 glasses of water daily  - Notify provider or go to ED if you develop any chest tightness,chest pain or shortness of breath   

## 2022-06-11 NOTE — Telephone Encounter (Signed)
Will send Paxlovid to pharmacy.patient may pickup work excuse letter or obtain from [employer].

## 2022-06-11 NOTE — Telephone Encounter (Signed)
Patient called back and stated that at home COVID test was positive.  Message routed to Marlowe Sax, NP

## 2022-06-11 NOTE — Progress Notes (Signed)
This service is provided via telemedicine  No vital signs collected/recorded due to the encounter was a telemedicine visit.   Location of patient (ex: home, work):  Home  Patient consents to a telephone visit:  Yes  Location of the provider (ex: office, home):  [employer].  Name of any referring provider:  Deanda Ruddell, Nelda Bucks, NP   Names of all persons participating in the telemedicine service and their role in the encounter:  Patient, [name], [place], [place], Webb Silversmith, NP.    Time spent on call: 8 minutes spent on the phone with Medical Assistant.       Provider: Marlowe Sax FNP-C  Yazid Pop, Nelda Bucks, NP  Patient Care Team: Suda Forbess, Nelda Bucks, NP as PCP - General (Family Medicine) Janith Lima, MD  Extended Emergency Contact Information Primary Emergency Contact: [facility] Address: [address]          [place], [place] [address] Johnnette Litter of [place] Phone: [phone] Relation: Mother Secondary Emergency Contact: Elta Guadeloupe Address: [place]          [place], [place] [address] Johnnette Litter of [place] Phone: [phone] Relation: Aunt  Code Status:  Full Code  Goals of care: Advanced Directive information    06/11/2022    9:56 AM  Advanced Directives  Does Patient Have a Medical Advance Directive? No  Would patient like information on creating a medical advance directive? No - Patient declined     Chief Complaint  Patient presents with   Acute Visit    Patient complains of coughing, chills, sore throat, sweating, fever 100.6, headache, loss of appetite, very fatigue, and nausea. Symptoms started 06/08/2022.    HPI:  Pt is a 51 y.o. female seen today for an acute visit for evaluation of cough,sore throat ,chills,fever 100.6,headache,Fatigue and nausea x 3 days.sweating since States her mother whom she lives with tested positive for COVID-19 Has taken Zyrtec,Flonase and saline nasal spray.  She denies any chest  tightness,chest pain,palpitation or shortness of breath. Home Kit COVID-19 tested positive   Recent lab results reviewed and discussed with patient.Elevated liver enzymes noted does not take Tylenol or regular basis and does not drink any alcohol.  Total cholesterol and LDL remains high.will reduce statin due to high liver enzymes and continue on dietary modification.   Hgb A 1 C still high but has improved from 11.9 down to 8.9 would like to start on Ozempic will complete her treat for COVID-19 then discuss Ozempic.   Past Medical History:  Diagnosis Date   Acid reflux    Anemia    Anxiety    Diabetes mellitus ([place])    Hx of cardiovascular stress test    ETT-echo 3/16: Normal   Hyperlipidemia    Hypertension    Inflammatory bowel diseases (IBD)    Thyroid disease    Vertigo    Past Surgical History:  Procedure Laterality Date   BILATERAL SALPINGECTOMY Left 01/20/2015   Procedure: LAPAROSCOPIC LEFT SALPINGECTOMY;  Surgeon: Everlene Farrier, MD;  Location: [facility];  Service: Gynecology;  Laterality: Left;   CESAREAN SECTION  2006   LAPAROSCOPIC ASSISTED VAGINAL HYSTERECTOMY N/A 01/20/2015   Procedure: LAPAROSCOPIC ASSISTED VAGINAL HYSTERECTOMY;  Surgeon: Everlene Farrier, MD;  Location: [facility];  Service: Gynecology;  Laterality: N/A;   MYOMECTOMY     2002    Allergies  Allergen Reactions   Clarithromycin Nausea And Vomiting    Outpatient Encounter Medications as of 06/11/2022  Medication Sig   atorvastatin (LIPITOR)  40 MG tablet Take 1 tablet (40 mg total) by mouth daily. APPOINTMENT OVERDUE   Cholecalciferol 1.25 MG (50000 UT) capsule Take 1 capsule (50,000 Units total) by mouth once a week.   escitalopram (LEXAPRO) 10 MG tablet Take 1 tablet (10 mg total) by mouth daily.   fluticasone (FLONASE) 50 MCG/ACT nasal spray Place 1 spray into both nostrils as needed for allergies or rhinitis.   hydrochlorothiazide (HYDRODIURIL) 12.5 MG tablet Take 1 tablet (12.5 mg total) by mouth  daily.   insulin glargine (LANTUS SOLOSTAR) 100 UNIT/ML Solostar Pen Inject 26 Units into the skin at bedtime.   Insulin Pen Needle (PEN NEEDLES) 32G X 4 MM MISC 1 Piece by Does not apply route in the morning, at noon, in the evening, and at bedtime.   lisinopril (ZESTRIL) 5 MG tablet Take 1 tablet (5 mg total) by mouth daily.   loratadine (CLARITIN) 10 MG tablet Take 1 tablet (10 mg total) by mouth daily.   meclizine (ANTIVERT) 25 MG tablet Take 1 tablet (25 mg total) by mouth 3 (three) times daily as needed for dizziness.   metFORMIN (GLUCOPHAGE) 500 MG tablet Take 2 tablets (1,000 mg total) by mouth 2 (two) times daily with a meal. APPOINTMENT OVERDUE   metoprolol tartrate (LOPRESSOR) 100 MG tablet Take 1 tablet (100 mg total) by mouth 2 (two) times daily.   Polyethyl Glycol-Propyl Glycol (SYSTANE) 0.4-0.3 % SOLN Apply 1 drop to eye as needed.   No facility-administered encounter medications on file as of 06/11/2022.    Review of Systems  Constitutional:  Positive for chills, fatigue and fever. Negative for appetite change and unexpected weight change.  HENT:  Positive for congestion, rhinorrhea and sore throat. Negative for dental problem, ear discharge, ear pain, facial swelling, hearing loss, nosebleeds, postnasal drip, sinus pressure, sinus pain, sneezing, tinnitus and trouble swallowing.   Eyes:  Negative for pain, discharge, redness, itching and visual disturbance.  Respiratory:  Positive for cough. Negative for chest tightness, shortness of breath and wheezing.   Cardiovascular:  Negative for chest pain, palpitations and leg swelling.  Gastrointestinal:  Positive for nausea. Negative for abdominal distention, abdominal pain, blood in stool, constipation, diarrhea and vomiting.  Musculoskeletal:  Positive for arthralgias. Negative for gait problem, joint swelling, myalgias, neck pain and neck stiffness.  Skin:  Negative for color change, pallor and rash.  Neurological:  Positive for  headaches. Negative for dizziness, weakness, light-headedness and numbness.    Immunization History  Administered Date(s) Administered   Hepatitis B 08/26/2021   Influenza, Seasonal, Injecte, Preservative Fre 11/23/2014, 07/10/2015   Influenza,inj,Quad PF,6+ Mos 11/23/2014, 07/10/2015, 07/18/2021   Influenza-Unspecified 11/23/2014, 07/10/2015, 08/03/2016   PFIZER(Purple Top)SARS-COV-2 Vaccination 12/29/2019, 02/09/2020, 08/08/2020, 03/05/2021   PNEUMOCOCCAL CONJUGATE-20 07/18/2021   Pfizer Covid-19 Vaccine Bivalent Booster 82yr & up 07/18/2021   Pneumococcal Polysaccharide-23 08/16/2016   Td 10/16/1999   Tdap 04/03/2013, 08/26/2021   Zoster Recombinat (Shingrix) 03/05/2021, 07/18/2021   Pertinent  Health Maintenance Due  Topic Date Due   OPHTHALMOLOGY EXAM  Never done   COLONOSCOPY (Pts 45-449yrInsurance coverage will need to be confirmed)  Never done   PAP SMEAR-Modifier  12/07/2017   INFLUENZA VACCINE  05/15/2022   FOOT EXAM  11/06/2022   HEMOGLOBIN A1C  12/09/2022   MAMMOGRAM  05/22/2024      12/13/2021   10:22 AM 12/29/2021    2:08 PM 02/28/2022   11:06 AM 06/08/2022    9:33 AM 06/11/2022    9:56 AM  Fall Risk  Falls in the past year? 0 0 0 0 0  Was there an injury with Fall? 0 0 0 0 0  Fall Risk Category Calculator 0 0 0 0 0  Fall Risk Category _0   Patient Fall Risk Level Low fall risk Low fall risk  Low fall risk Low fall risk  Patient at Risk for Falls Due to _1   Fall risk Follow up _2    Functional Status Survey:    There were no vitals filed for this visit. There is no height or weight on file to calculate BMI. Physical Exam Constitutional:      General: She is not in acute distress.    Appearance: She is not ill-appearing.  Pulmonary:     Effort:  Pulmonary effort is normal. No respiratory distress.  Neurological:     Mental Status: She is alert and oriented to person, place, and time.  Psychiatric:        Mood and Affect: Mood normal.        Behavior: Behavior normal.    Labs reviewed: Recent Labs    07/27/21 1335 11/06/21 0836 06/08/22 1013  NA 135 136 137  K 3.8 4.3 4.3  CL 99 97* 101  CO2 _3 GLUCOSE 395* 282* 156*  BUN <5* 9 11  CREATININE 0.79 0.76 0.89  CALCIUM 9.8 10.7* 9.7   Recent Labs    11/06/21 0836 06/08/22 1013  AST 49* 44*  ALT 51* 37*  BILITOT 0.5 0.3  PROT 8.1 7.7   Recent Labs    07/27/21 1335 11/06/21 0836 06/08/22 1013  WBC 8.9 9.2 10.6  NEUTROABS  --  5,658 6,890  HGB 13.9 14.5 12.8  HCT 42.5 44.6 37.8  MCV 81.3 82.6 80.3  PLT 376 392 371   Lab Results  Component Value Date   TSH 3.36 06/08/2022   Lab Results  Component Value Date   HGBA1C 8.9 (H) 06/08/2022   Lab Results  Component Value Date   CHOL 205 (H) 06/08/2022   HDL 43 (L) 06/08/2022   LDLCALC 139 (H) 06/08/2022   TRIG 112 06/08/2022   CHOLHDL 4.8 06/08/2022    Significant Diagnostic Results in last 30 days:  MM DIAG BREAST TOMO BILATERAL  Result Date: 05/22/2022 CLINICAL DATA:  51 year old female presenting for evaluation of an enlarging lump in the right axilla. EXAM: DIGITAL DIAGNOSTIC BILATERAL MAMMOGRAM WITH TOMOSYNTHESIS; ULTRASOUND RIGHT BREAST LIMITED TECHNIQUE: Bilateral digital diagnostic mammography and breast tomosynthesis was performed.; Targeted ultrasound examination of the right breast was performed COMPARISON:  Previous exam(s). ACR Breast Density Category c: The breast tissue is heterogeneously dense, which may obscure small masses. FINDINGS: Mammogram: Right breast: A skin BB marks the palpable site of concern reported by the patient in the right axilla. A spot tangential view of this area was performed in addition to standard views. There is no new abnormality at the palpable site or  elsewhere in the right breast to suggest the presence of malignancy. Left breast: No suspicious mass, distortion, or microcalcifications are identified to suggest presence of malignancy. On physical exam I feel a soft bulge in the right axilla which is similar though somewhat bigger compared to the left. Ultrasound: Targeted ultrasound is performed at the palpable site of concern in the right axilla demonstrating no cystic or solid  mass. There is only normal fat density tissue. IMPRESSION: 1. At the palpable site of concern in the right axilla there is a benign fat pad. 2.  No mammographic evidence of malignancy bilaterally. RECOMMENDATION: Screening mammogram in one year.(Code:SM-B-01Y) I have discussed the findings and recommendations with the patient. If applicable, a reminder letter will be sent to the patient regarding the next appointment. BI-RADS CATEGORY  2: Benign. Electronically Signed   By: Audie Pinto M.D.   On: 05/22/2022 15:08   US BREAST LTD UNI RIGHT INC AXILLA  Result Date: 05/22/2022 CLINICAL DATA:  51 year old female presenting for evaluation of an enlarging lump in the right axilla. EXAM: DIGITAL DIAGNOSTIC BILATERAL MAMMOGRAM WITH TOMOSYNTHESIS; ULTRASOUND RIGHT BREAST LIMITED TECHNIQUE: Bilateral digital diagnostic mammography and breast tomosynthesis was performed.; Targeted ultrasound examination of the right breast was performed COMPARISON:  Previous exam(s). ACR Breast Density Category c: The breast tissue is heterogeneously dense, which may obscure small masses. FINDINGS: Mammogram: Right breast: A skin BB marks the palpable site of concern reported by the patient in the right axilla. A spot tangential view of this area was performed in addition to standard views. There is no new abnormality at the palpable site or elsewhere in the right breast to suggest the presence of malignancy. Left breast: No suspicious mass, distortion, or microcalcifications are identified to suggest  presence of malignancy. On physical exam I feel a soft bulge in the right axilla which is similar though somewhat bigger compared to the left. Ultrasound: Targeted ultrasound is performed at the palpable site of concern in the right axilla demonstrating no cystic or solid mass. There is only normal fat density tissue. IMPRESSION: 1. At the palpable site of concern in the right axilla there is a benign fat pad. 2.  No mammographic evidence of malignancy bilaterally. RECOMMENDATION: Screening mammogram in one year.(Code:SM-B-01Y) I have discussed the findings and recommendations with the patient. If applicable, a reminder letter will be sent to the patient regarding the next appointment. BI-RADS CATEGORY  2: Benign. Electronically Signed   By: Audie Pinto M.D.   On: 05/22/2022 15:08   Assessment/Plan 1. Upper respiratory tract infection due to COVID-19 virus Reports Temp 100.6  - Start on supportive supplements as below - zinc gluconate 50 MG tablet; Take 1 tablet (50 mg total) by mouth daily for 14 days.  Dispense: 14 tablet; Refill: 0 - guaiFENesin (MUCINEX) 600 MG 12 hr tablet; Take 1 tablet (600 mg total) by mouth 2 (two) times daily for 14 days.  Dispense: 28 tablet; Refill: 0 - acetaminophen (TYLENOL) 500 MG tablet; Take 1 tablet (500 mg total) by mouth every 8 (eight) hours as needed for up to 14 days for fever or moderate pain.  Dispense: 30 tablet; Refill: 0 - nirmatrelvir/ritonavir EUA (PAXLOVID) 20 x 150 MG & 10 x 100MG TABS; Take 3 tablets by mouth 2 (two) times daily for 5 days. (Take nirmatrelvir 150 mg two tablets twice daily for 5 days and ritonavir 100 mg one tablet twice daily for 5 days) Patient GFR is 78  Dispense: 30 tablet; Refill: 0  2. Positive self-administered antigen test for COVID-19 Tested positive today but symptoms were worst over the weekend.  Will treat with  - nirmatrelvir/ritonavir EUA (PAXLOVID) 20 x 150 MG & 10 x 100MG TABS; Take 3 tablets by mouth 2 (two) times  daily for 5 days. (Take nirmatrelvir 150 mg two tablets twice daily for 5 days and ritonavir 100 mg one tablet twice daily for 5  days) Patient GFR is 78  Dispense: 30 tablet; Refill: 0 - advised to hold Atorvastatin 20 mg tablet until Paxlovid is complete.    3. Elevated liver function tests AST 44,ALT 37  Advised to reduce Atorvastatin from 20 mg to 10 mg tablet daily  Recheck Hepatic panel in 2 weeks  - BMP with eGFR([facility]); Future - Hepatic function panel; Future  4. Mixed hyperlipidemia Chol 205,LAD 139 higher than previous. Reduce Atorvastatin as above due to elevated liver enzymes.    Family/ staff Communication: Reviewed plan of care with patient verbalized understanding  Labs/tests ordered:  - BMP with eGFR([facility]); Future - Hepatic function panel; Future  Next Appointment: Return in 2 weeks (on 06/25/2022) for recheck Hepatic panel and BMP .  I connected with  Grace Isaac on 06/11/22 by a video enabled telemedicine application and verified that I am speaking with the correct person using two identifiers.   I discussed the limitations of evaluation and management by telemedicine. The patient expressed understanding and agreed to proceed.  Spent 12 minutes of face to face with patient  >50% time spent counseling; reviewing medical record; COVID-19 test results ; labs; and developing future plan of care.   Sandrea Hughs, NP

## 2022-06-12 LAB — HEMOGLOBIN A1C
Hgb A1c MFr Bld: 8.9 % of total Hgb — ABNORMAL HIGH (ref <5.7)
Mean Plasma Glucose: 209 mg/dL
eAG (mmol/L): 11.6 mmol/L

## 2022-06-12 LAB — TSH: TSH: 3.36 mIU/L

## 2022-06-12 LAB — CBC WITH DIFFERENTIAL/PLATELET
Absolute Monocytes: 530 cells/uL (ref 200–950)
Basophils Absolute: 32 cells/uL (ref 0–200)
Basophils Relative: 0.3 %
Eosinophils Absolute: 85 cells/uL (ref 15–500)
Eosinophils Relative: 0.8 %
HCT: 37.8 % (ref 35.0–45.0)
Hemoglobin: 12.8 g/dL (ref 11.7–15.5)
Lymphs Abs: 3063 cells/uL (ref 850–3900)
MCH: 27.2 pg (ref 27.0–33.0)
MCHC: 33.9 g/dL (ref 32.0–36.0)
MCV: 80.3 fL (ref 80.0–100.0)
MPV: 10 fL (ref 7.5–12.5)
Monocytes Relative: 5 %
Neutro Abs: 6890 cells/uL (ref 1500–7800)
Neutrophils Relative %: 65 %
Platelets: 371 10*3/uL (ref 140–400)
RBC: 4.71 10*6/uL (ref 3.80–5.10)
RDW: 13.2 % (ref 11.0–15.0)
Total Lymphocyte: 28.9 %
WBC: 10.6 10*3/uL (ref 3.8–10.8)

## 2022-06-12 LAB — COMPLETE METABOLIC PANEL WITH GFR
AG Ratio: 1.6 (calc) (ref 1.0–2.5)
ALT: 37 U/L — ABNORMAL HIGH (ref 6–29)
AST: 44 U/L — ABNORMAL HIGH (ref 10–35)
Albumin: 4.7 g/dL (ref 3.6–5.1)
Alkaline phosphatase (APISO): 109 U/L (ref 37–153)
BUN: 11 mg/dL (ref 7–25)
CO2: 20 mmol/L (ref 20–32)
Calcium: 9.7 mg/dL (ref 8.6–10.4)
Chloride: 101 mmol/L (ref 98–110)
Creat: 0.89 mg/dL (ref 0.50–1.03)
Globulin: 3 g/dL (calc) (ref 1.9–3.7)
Glucose, Bld: 156 mg/dL — ABNORMAL HIGH (ref 65–99)
Potassium: 4.3 mmol/L (ref 3.5–5.3)
Sodium: 137 mmol/L (ref 135–146)
Total Bilirubin: 0.3 mg/dL (ref 0.2–1.2)
Total Protein: 7.7 g/dL (ref 6.1–8.1)
eGFR: 78 mL/min/{1.73_m2} (ref >=60)

## 2022-06-12 LAB — LIPID PANEL
Cholesterol: 205 mg/dL — ABNORMAL HIGH (ref <200)
HDL: 43 mg/dL — ABNORMAL LOW (ref >=50)
LDL Cholesterol (Calc): 139 mg/dL (calc) — ABNORMAL HIGH
Non-HDL Cholesterol (Calc): 162 mg/dL (calc) — ABNORMAL HIGH (ref <130)
Total CHOL/HDL Ratio: 4.8 (calc) (ref <5.0)
Triglycerides: 112 mg/dL (ref <150)

## 2022-06-12 LAB — VITAMIN D 1,25 DIHYDROXY
Vitamin D 1, 25 (OH)2 Total: 43 pg/mL (ref 18–72)
Vitamin D2 1, 25 (OH)2: <8 pg/mL
Vitamin D3 1, 25 (OH)2: 43 pg/mL

## 2022-06-22 ENCOUNTER — Other Ambulatory Visit: Payer: 59

## 2022-06-22 ENCOUNTER — Other Ambulatory Visit: Payer: Self-pay

## 2022-06-22 DIAGNOSIS — R7989 Other specified abnormal findings of blood chemistry: Secondary | ICD-10-CM

## 2022-06-23 LAB — BASIC METABOLIC PANEL WITH GFR
BUN: 9 mg/dL (ref 7–25)
CO2: 23 mmol/L (ref 20–32)
Calcium: 9.9 mg/dL (ref 8.6–10.4)
Chloride: 104 mmol/L (ref 98–110)
Creat: 0.84 mg/dL (ref 0.50–1.03)
Glucose, Bld: 220 mg/dL — ABNORMAL HIGH (ref 65–99)
Potassium: 4.3 mmol/L (ref 3.5–5.3)
Sodium: 139 mmol/L (ref 135–146)
eGFR: 84 mL/min/{1.73_m2} (ref >=60)

## 2022-06-23 LAB — HEPATIC FUNCTION PANEL
AG Ratio: 1.7 (calc) (ref 1.0–2.5)
ALT: 46 U/L — ABNORMAL HIGH (ref 6–29)
AST: 53 U/L — ABNORMAL HIGH (ref 10–35)
Albumin: 4.7 g/dL (ref 3.6–5.1)
Alkaline phosphatase (APISO): 90 U/L (ref 37–153)
Bilirubin, Direct: 0.1 mg/dL (ref 0.0–0.2)
Globulin: 2.7 g/dL (calc) (ref 1.9–3.7)
Indirect Bilirubin: 0.2 mg/dL (calc) (ref 0.2–1.2)
Total Bilirubin: 0.3 mg/dL (ref 0.2–1.2)
Total Protein: 7.4 g/dL (ref 6.1–8.1)

## 2022-06-25 ENCOUNTER — Other Ambulatory Visit: Payer: Self-pay

## 2022-06-25 ENCOUNTER — Other Ambulatory Visit: Payer: 59

## 2022-06-25 DIAGNOSIS — E1165 Type 2 diabetes mellitus with hyperglycemia: Secondary | ICD-10-CM

## 2022-06-25 MED ORDER — METFORMIN HCL 500 MG PO TABS: 1000.0000 mg | ORAL_TABLET | Freq: Two times a day (BID) | ORAL | 0 refills | Status: DC

## 2022-07-11 ENCOUNTER — Ambulatory Visit (INDEPENDENT_AMBULATORY_CARE_PROVIDER_SITE_OTHER): Payer: 59 | Admitting: Family

## 2022-07-11 ENCOUNTER — Encounter: Payer: Self-pay | Admitting: Family

## 2022-07-11 VITALS — BP 130/80 | HR 90 | Temp 97.2°F | Resp 16 | Ht 67.5 in | Wt 195.8 lb

## 2022-07-11 DIAGNOSIS — R7989 Other specified abnormal findings of blood chemistry: Secondary | ICD-10-CM | POA: Diagnosis not present

## 2022-07-11 DIAGNOSIS — E1165 Type 2 diabetes mellitus with hyperglycemia: Secondary | ICD-10-CM

## 2022-07-11 NOTE — Progress Notes (Signed)
Provider: Marlowe Sax FNP-C  Kimaria Struthers, Nelda Bucks, NP  Patient Care Team: Vedanth Sirico, Nelda Bucks, NP as PCP - General (Family Medicine) Janith Lima, MD  Extended Emergency Contact Information Primary Emergency Contact: [facility] Address: [address]          [place], [place] [address] Johnnette Litter of [place] Phone: [phone] Relation: Mother Secondary Emergency Contact: Elta Guadeloupe Address: [address]          [place],  [address] Johnnette Litter of [place] Phone: [phone] Relation: Aunt  Code Status: Full Code  Goals of care: Advanced Directive information    07/11/2022    9:17 AM  Advanced Directives  Does Patient Have a Medical Advance Directive? No  Would patient like information on creating a medical advance directive? No - Patient declined     Chief Complaint  Patient presents with   Follow-up    2 week follow up on elevated liver function test.    Pt is a 51 y.o. female seen today for an acute visit for 2 weeks follow up for elevated liver function test and high blood sugar. States has been busy caring for her uncle who had a stroke currently in the Nursing Home but will discharge home.Has not been exercising. Liver enzymes were AST 53,ALT 46 states drinks socially but has not been drinking lately. Takes Tylenol occasional but not too often.Denies any abdominal pain,nausea,vomiting or bloating.    Past Medical History:  Diagnosis Date   Acid reflux    Anemia    Anxiety    Diabetes mellitus ([place])    Hx of cardiovascular stress test    ETT-echo 3/16: Normal   Hyperlipidemia    Hypertension    Inflammatory bowel diseases (IBD)    Thyroid disease    Vertigo    Past Surgical History:  Procedure Laterality Date   BILATERAL SALPINGECTOMY Left 01/20/2015   Procedure: LAPAROSCOPIC LEFT SALPINGECTOMY;  Surgeon: Everlene Farrier, MD;  Location: [facility];  Service: Gynecology;  Laterality: Left;   CESAREAN SECTION  2006   LAPAROSCOPIC ASSISTED  VAGINAL HYSTERECTOMY N/A 01/20/2015   Procedure: LAPAROSCOPIC ASSISTED VAGINAL HYSTERECTOMY;  Surgeon: Everlene Farrier, MD;  Location: [facility];  Service: Gynecology;  Laterality: N/A;   MYOMECTOMY     2002    Allergies  Allergen Reactions   Clarithromycin Nausea And Vomiting    Outpatient Encounter Medications as of 07/11/2022  Medication Sig   Cholecalciferol 1.25 MG (50000 UT) capsule Take 1 capsule (50,000 Units total) by mouth once a week.   escitalopram (LEXAPRO) 10 MG tablet Take 1 tablet (10 mg total) by mouth daily.   fluticasone (FLONASE) 50 MCG/ACT nasal spray Place 1 spray into both nostrils as needed for allergies or rhinitis.   hydrochlorothiazide (HYDRODIURIL) 12.5 MG tablet Take 1 tablet (12.5 mg total) by mouth daily.   insulin glargine (LANTUS SOLOSTAR) 100 UNIT/ML Solostar Pen Inject 26 Units into the skin at bedtime.   Insulin Pen Needle (PEN NEEDLES) 32G X 4 MM MISC 1 Piece by Does not apply route in the morning, at noon, in the evening, and at bedtime.   lisinopril (ZESTRIL) 5 MG tablet Take 1 tablet (5 mg total) by mouth daily.   loratadine (CLARITIN) 10 MG tablet Take 1 tablet (10 mg total) by mouth daily.   metFORMIN (GLUCOPHAGE) 500 MG tablet Take 2 tablets (1,000 mg total) by mouth 2 (two) times daily with a meal. APPOINTMENT OVERDUE   metoprolol tartrate (LOPRESSOR) 100 MG tablet Take 1  tablet (100 mg total) by mouth 2 (two) times daily.   Polyethyl Glycol-Propyl Glycol (SYSTANE) 0.4-0.3 % SOLN Apply 1 drop to eye as needed.   [DISCONTINUED] atorvastatin (LIPITOR) 40 MG tablet Take 1 tablet (40 mg total) by mouth daily. APPOINTMENT OVERDUE   [DISCONTINUED] meclizine (ANTIVERT) 25 MG tablet Take 1 tablet (25 mg total) by mouth 3 (three) times daily as needed for dizziness.   No facility-administered encounter medications on file as of 07/11/2022.    Review of Systems  Constitutional:  Negative for appetite change, chills, fatigue, fever and unexpected weight  change.  HENT:  Negative for congestion, dental problem, ear discharge, ear pain, facial swelling, hearing loss, nosebleeds, postnasal drip, rhinorrhea, sinus pressure, sinus pain, sneezing, sore throat, tinnitus and trouble swallowing.   Eyes:  Negative for pain, discharge, redness, itching and visual disturbance.  Respiratory:  Negative for cough, chest tightness, shortness of breath and wheezing.   Cardiovascular:  Negative for chest pain, palpitations and leg swelling.  Gastrointestinal:  Negative for abdominal distention, abdominal pain, blood in stool, constipation, diarrhea, nausea and vomiting.  Genitourinary:  Negative for difficulty urinating, dysuria, flank pain, frequency and urgency.  Musculoskeletal:  Negative for arthralgias, back pain, gait problem, joint swelling, myalgias, neck pain and neck stiffness.  Skin:  Negative for color change, pallor and rash.  Neurological:  Negative for dizziness, syncope, speech difficulty, weakness, light-headedness, numbness and headaches.  Hematological:  Does not bruise/bleed easily.    Immunization History  Administered Date(s) Administered   Hepatitis B 08/26/2021   Influenza, Seasonal, Injecte, Preservative Fre 11/23/2014, 07/10/2015   Influenza,inj,Quad PF,6+ Mos 11/23/2014, 07/10/2015, 07/18/2021   Influenza-Unspecified 11/23/2014, 07/10/2015, 08/03/2016   PFIZER(Purple Top)SARS-COV-2 Vaccination 12/29/2019, 02/09/2020, 08/08/2020, 03/05/2021   PNEUMOCOCCAL CONJUGATE-20 07/18/2021   Pfizer Covid-19 Vaccine Bivalent Booster 39yr & up 07/18/2021   Pneumococcal Polysaccharide-23 08/16/2016   Td 10/16/1999   Td (Adult), 2 Lf Tetanus Toxid, Preservative Free 10/16/1999   Tdap 04/03/2013, 08/26/2021   Zoster Recombinat (Shingrix) 03/05/2021, 07/18/2021   Pertinent  Health Maintenance Due  Topic Date Due   OPHTHALMOLOGY EXAM  Never done   COLONOSCOPY (Pts 45-464yrInsurance coverage will need to be confirmed)  Never done   PAP  SMEAR-Modifier  12/07/2017   INFLUENZA VACCINE  05/15/2022   FOOT EXAM  11/06/2022   HEMOGLOBIN A1C  12/09/2022   MAMMOGRAM  05/22/2024      12/29/2021    2:08 PM 02/28/2022   11:06 AM 06/08/2022    9:33 AM 06/11/2022    9:56 AM 07/11/2022    9:17 AM  Fall Risk  Falls in the past year? 0 0 0 0 0  Was there an injury with Fall? 0 0 0 0 0  Fall Risk Category Calculator 0 0 0 0 0  Fall Risk Category Low Low Low Low Low  Patient Fall Risk Level Low fall risk  Low fall risk Low fall risk Low fall risk  Patient at Risk for Falls Due to No Fall Risks No Fall Risks No Fall Risks No Fall Risks No Fall Risks  Fall risk Follow up Falls evaluation completed Falls evaluation completed Falls evaluation completed Falls evaluation completed Falls evaluation completed   Functional Status Survey:    Vitals:   07/11/22 0906  BP: 130/80  Pulse: 90  Resp: 16  Temp: (!) 97.2 F (36.2 C)  SpO2: 100%  Weight: 195 lb 12.8 oz (88.8 kg)  Height: 5' 7.5" (1.715 m)   Body mass index is 30.21 kg/m.  Physical Exam Vitals reviewed.  Constitutional:      General: She is not in acute distress.    Appearance: Normal appearance. She is obese. She is not ill-appearing or diaphoretic.  HENT:     Head: Normocephalic.     Nose: Nose normal. No congestion or rhinorrhea.     Mouth/Throat:     Mouth: Mucous membranes are moist.     Pharynx: Oropharynx is clear. No oropharyngeal exudate or posterior oropharyngeal erythema.  Eyes:     General: No scleral icterus.       Right eye: No discharge.        Left eye: No discharge.     Extraocular Movements: Extraocular movements intact.     Conjunctiva/sclera: Conjunctivae normal.     Pupils: Pupils are equal, round, and reactive to light.  Neck:     Vascular: No carotid bruit.  Cardiovascular:     Rate and Rhythm: Normal rate and regular rhythm.     Pulses: Normal pulses.     Heart sounds: Normal heart sounds. No murmur heard.    No friction rub. No gallop.   Pulmonary:     Effort: Pulmonary effort is normal. No respiratory distress.     Breath sounds: Normal breath sounds. No wheezing, rhonchi or rales.  Chest:     Chest wall: No tenderness.  Abdominal:     General: Bowel sounds are normal. There is no distension.     Palpations: Abdomen is soft. There is no mass.     Tenderness: There is no abdominal tenderness. There is no right CVA tenderness, left CVA tenderness, guarding or rebound.  Musculoskeletal:        General: No swelling or tenderness. Normal range of motion.     Cervical back: Normal range of motion. No rigidity or tenderness.     Right lower leg: No edema.     Left lower leg: No edema.  Lymphadenopathy:     Cervical: No cervical adenopathy.  Skin:    General: Skin is warm and dry.     Coloration: Skin is not pale.     Findings: No bruising, erythema, lesion or rash.  Neurological:     Mental Status: She is alert and oriented to person, place, and time.     Cranial Nerves: No cranial nerve deficit.     Sensory: No sensory deficit.     Motor: No weakness.     Coordination: Coordination normal.     Gait: Gait normal.  Psychiatric:        Mood and Affect: Mood normal.        Speech: Speech normal.        Behavior: Behavior normal.     Labs reviewed: Recent Labs    11/06/21 0836 06/08/22 1013 06/22/22 1417  NA 136 137 139  K 4.3 4.3 4.3  CL 97* 101 104  CO2 '30 20 23  '$ GLUCOSE 282* 156* 220*  BUN '9 11 9  '$ CREATININE 0.76 0.89 0.84  CALCIUM 10.7* 9.7 9.9   Recent Labs    11/06/21 0836 06/08/22 1013 06/22/22 1417  AST 49* 44* 53*  ALT 51* 37* 46*  BILITOT 0.5 0.3 0.3  PROT 8.1 7.7 7.4   Recent Labs    07/27/21 1335 11/06/21 0836 06/08/22 1013  WBC 8.9 9.2 10.6  NEUTROABS  --  5,658 6,890  HGB 13.9 14.5 12.8  HCT 42.5 44.6 37.8  MCV 81.3 82.6 80.3  PLT 376 392 371   Lab  Results  Component Value Date   TSH 3.36 06/08/2022   Lab Results  Component Value Date   HGBA1C 8.9 (H) 06/08/2022    Lab Results  Component Value Date   CHOL 205 (H) 06/08/2022   HDL 43 (L) 06/08/2022   LDLCALC 139 (H) 06/08/2022   TRIG 112 06/08/2022   CHOLHDL 4.8 06/08/2022    Significant Diagnostic Results in last 30 days:  No results found.  Assessment/Plan 1. Type 2 diabetes mellitus with hyperglycemia, without long-term current use of insulin (HCC) Lab Results  Component Value Date   HGBA1C 8.9 (H) 06/08/2022  No CBG for review  Continue on metformin and Lantus 26 units at bedtime -Up-to-date with annual foot exam -Advised to follow-up with ophthalmologist for annual eye exam  2. Elevated liver function tests AST 53 and ALT 46 Has stopped atorvastatin -We will recheck hepatic panel - Hepatic function panel  Family/ staff Communication: Reviewed plan of care with patient verbalized understanding  Labs/tests ordered: - Hepatic function panel  Next Appointment: Return if symptoms worsen or fail to improve.   Sandrea Hughs, NP

## 2022-07-12 LAB — HEPATIC FUNCTION PANEL
AG Ratio: 1.5 (calc) (ref 1.0–2.5)
ALT: 25 U/L (ref 6–29)
AST: 35 U/L (ref 10–35)
Albumin: 4.6 g/dL (ref 3.6–5.1)
Alkaline phosphatase (APISO): 94 U/L (ref 37–153)
Bilirubin, Direct: 0.1 mg/dL (ref 0.0–0.2)
Globulin: 3.1 g/dL (calc) (ref 1.9–3.7)
Indirect Bilirubin: 0.3 mg/dL (calc) (ref 0.2–1.2)
Total Bilirubin: 0.4 mg/dL (ref 0.2–1.2)
Total Protein: 7.7 g/dL (ref 6.1–8.1)

## 2022-07-21 ENCOUNTER — Other Ambulatory Visit: Payer: Self-pay | Admitting: Family

## 2022-07-21 DIAGNOSIS — E1165 Type 2 diabetes mellitus with hyperglycemia: Secondary | ICD-10-CM

## 2022-08-21 ENCOUNTER — Encounter: Payer: Self-pay | Admitting: Family

## 2022-08-22 ENCOUNTER — Encounter: Payer: Self-pay | Admitting: Family

## 2022-08-22 ENCOUNTER — Ambulatory Visit (INDEPENDENT_AMBULATORY_CARE_PROVIDER_SITE_OTHER): Payer: 59 | Admitting: Family

## 2022-08-22 VITALS — BP 130/88 | HR 88 | Temp 97.1°F | Resp 16 | Ht 67.5 in | Wt 192.6 lb

## 2022-08-22 DIAGNOSIS — I10 Essential (primary) hypertension: Secondary | ICD-10-CM

## 2022-08-22 DIAGNOSIS — E039 Hypothyroidism, unspecified: Secondary | ICD-10-CM | POA: Diagnosis not present

## 2022-08-22 DIAGNOSIS — E782 Mixed hyperlipidemia: Secondary | ICD-10-CM | POA: Diagnosis not present

## 2022-08-22 DIAGNOSIS — E1165 Type 2 diabetes mellitus with hyperglycemia: Secondary | ICD-10-CM

## 2022-08-22 DIAGNOSIS — Z23 Encounter for immunization: Secondary | ICD-10-CM

## 2022-08-22 DIAGNOSIS — R1011 Right upper quadrant pain: Secondary | ICD-10-CM

## 2022-08-22 NOTE — Progress Notes (Signed)
Provider: Marlowe Sax FNP-C  Khyrin Trevathan, Nelda Bucks, NP  Patient Care Team: Dustie Brittle, Nelda Bucks, NP as PCP - General (Family Medicine) Janith Lima, MD  Extended Emergency Contact Information Primary Emergency Contact: [facility] Address: [address]          [place], [place] [address] Johnnette Litter of [place] Phone: [phone] Relation: Mother  Code Status:  Full Code  Goals of care: Advanced Directive information    08/22/2022   12:35 PM  Advanced Directives  Does Patient Have a Medical Advance Directive? No  Would patient like information on creating a medical advance directive? No - Patient declined     Chief Complaint  Patient presents with   Acute Visit    Patient is here to discuss x-ray results    HPI:  Pt is a 51 y.o. female seen today for an acute visit to discuss lower back X-ray results.She had X-ray done at [facility] in [place] which showed no acute lumbar fracture or subluxation but indicated mild spondylotic changes.Also noted clustered punctate calcification project over the right upper abdomen.she was advised to follow up with PCP for further evaluation.  Denies any fever,chills,flank pain,nausea or vomiting. She is concerned about her liver due to previous elevated liver enzymes though last labs were normal after stopping statin.  Also wonders whether she should get back on Statin for her cholesterol.discussed RUQ pain with CT scan then will discuss on use of statin.might benefit from LevQvio injection instead of statin.states has changed her diet and going to the Gym to exercise though limited due to lower back pain.discussed low impact exercise like water aerobics and using stationary bike machine. Has lost 4 lbs weight loss since last seen 07/11/2022.   She follows up with Orthopedic for lumbar pain.  Past Medical History:  Diagnosis Date   Acid reflux    Anemia    Anxiety    Diabetes mellitus ([place])    Hx of  cardiovascular stress test    ETT-echo 3/16: Normal   Hyperlipidemia    Hypertension    Inflammatory bowel diseases (IBD)    Thyroid disease    Vertigo    Past Surgical History:  Procedure Laterality Date   BILATERAL SALPINGECTOMY Left 01/20/2015   Procedure: LAPAROSCOPIC LEFT SALPINGECTOMY;  Surgeon: Everlene Farrier, MD;  Location: [facility];  Service: Gynecology;  Laterality: Left;   CESAREAN SECTION  2006   LAPAROSCOPIC ASSISTED VAGINAL HYSTERECTOMY N/A 01/20/2015   Procedure: LAPAROSCOPIC ASSISTED VAGINAL HYSTERECTOMY;  Surgeon: Everlene Farrier, MD;  Location: [facility];  Service: Gynecology;  Laterality: N/A;   MYOMECTOMY     2002    Allergies  Allergen Reactions   Clarithromycin Nausea And Vomiting    Outpatient Encounter Medications as of 08/22/2022  Medication Sig   Cholecalciferol 1.25 MG (50000 UT) capsule Take 1 capsule (50,000 Units total) by mouth once a week.   escitalopram (LEXAPRO) 10 MG tablet Take 1 tablet (10 mg total) by mouth daily.   fluticasone (FLONASE) 50 MCG/ACT nasal spray Place 1 spray into both nostrils as needed for allergies or rhinitis.   hydrochlorothiazide (HYDRODIURIL) 12.5 MG tablet Take 1 tablet (12.5 mg total) by mouth daily.   insulin glargine (LANTUS SOLOSTAR) 100 UNIT/ML Solostar Pen Inject 26 Units into the skin at bedtime.   Insulin Pen Needle (PEN NEEDLES) 32G X 4 MM MISC 1 Piece by Does not apply route in the morning, at noon, in the evening, and at bedtime.   lisinopril (  ZESTRIL) 5 MG tablet Take 1 tablet (5 mg total) by mouth daily.   loratadine (CLARITIN) 10 MG tablet Take 1 tablet (10 mg total) by mouth daily.   metFORMIN (GLUCOPHAGE) 500 MG tablet TAKE 2 TABLETS BY MOUTH TWICE DAILY WITH MEALS . APPOINTMENT REQUIRED FOR FUTURE REFILLS   metoprolol tartrate (LOPRESSOR) 100 MG tablet Take 1 tablet (100 mg total) by mouth 2 (two) times daily.   Polyethyl Glycol-Propyl Glycol (SYSTANE) 0.4-0.3 % SOLN Apply 1 drop to eye as needed.   No  facility-administered encounter medications on file as of 08/22/2022.    Review of Systems  Constitutional:  Negative for appetite change, chills, fatigue and fever.       4 lbs weight loss since last visit with her dietary modification and exercise   HENT:  Negative for congestion, dental problem, ear discharge, ear pain, facial swelling, hearing loss, nosebleeds, postnasal drip, rhinorrhea, sinus pressure, sinus pain, sneezing, sore throat, tinnitus and trouble swallowing.   Eyes:  Negative for pain, discharge, redness, itching and visual disturbance.  Respiratory:  Negative for cough, chest tightness, shortness of breath and wheezing.   Cardiovascular:  Negative for chest pain, palpitations and leg swelling.  Gastrointestinal:  Positive for abdominal pain. Negative for abdominal distention, blood in stool, constipation, diarrhea, nausea and vomiting.  Endocrine: Negative for cold intolerance, heat intolerance, polydipsia, polyphagia and polyuria.  Genitourinary:  Negative for difficulty urinating, dysuria, flank pain, frequency and urgency.  Musculoskeletal:  Positive for arthralgias and back pain. Negative for gait problem, joint swelling, myalgias, neck pain and neck stiffness.  Skin:  Negative for color change, pallor, rash and wound.  Neurological:  Negative for dizziness, syncope, speech difficulty, weakness, light-headedness, numbness and headaches.  Hematological:  Does not bruise/bleed easily.  Psychiatric/Behavioral:  Negative for agitation, behavioral problems, confusion, hallucinations, self-injury, sleep disturbance and suicidal ideas. The patient is not nervous/anxious.     Immunization History  Administered Date(s) Administered   Hepatitis B 08/26/2021   Influenza, Seasonal, Injecte, Preservative Fre 11/23/2014, 07/10/2015   Influenza,inj,Quad PF,6+ Mos 11/23/2014, 07/10/2015, 07/18/2021   Influenza-Unspecified 11/23/2014, 07/10/2015, 08/03/2016   PFIZER(Purple  Top)SARS-COV-2 Vaccination 12/29/2019, 02/09/2020, 08/08/2020, 03/05/2021   PNEUMOCOCCAL CONJUGATE-20 07/18/2021   Pfizer Covid-19 Vaccine Bivalent Booster 12yr & up 07/18/2021   Pneumococcal Polysaccharide-23 08/16/2016   Td 10/16/1999   Td (Adult), 2 Lf Tetanus Toxid, Preservative Free 10/16/1999   Tdap 04/03/2013, 08/26/2021   Zoster Recombinat (Shingrix) 03/05/2021, 07/18/2021   Pertinent  Health Maintenance Due  Topic Date Due   OPHTHALMOLOGY EXAM  Never done   COLONOSCOPY (Pts 45-455yrInsurance coverage will need to be confirmed)  Never done   PAP SMEAR-Modifier  12/07/2017   INFLUENZA VACCINE  05/15/2022   FOOT EXAM  11/06/2022   HEMOGLOBIN A1C  12/09/2022   MAMMOGRAM  05/22/2024      02/28/2022   11:06 AM 06/08/2022    9:33 AM 06/11/2022    9:56 AM 07/11/2022    9:17 AM 08/22/2022   12:34 PM  Fall Risk  Falls in the past year? 0 0 0 0 0  Was there an injury with Fall? 0 0 0 0 0  Fall Risk Category Calculator 0 0 0 0 0  Fall Risk Category _0   Patient Fall Risk Level  Low fall risk Low fall risk Low fall risk Low fall risk  Patient at Risk for Falls Due to _1   Fall risk Follow up _0    Functional Status Survey:    Vitals:   08/22/22 1223  BP: 130/88  Pulse: 88  Resp: 16  Temp: (!) 97.1 F (36.2 C)  SpO2: 99%  Weight: 192 lb 9.6 oz (87.4 kg)  Height: 5' 7.5" (1.715 m)   Body mass index is 29.72 kg/m. Physical Exam Vitals reviewed.  Constitutional:      General: She is not in acute distress.    Appearance: Normal appearance. She is overweight. She is not ill-appearing or diaphoretic.  HENT:     Head: Normocephalic.     Right Ear: Tympanic membrane, ear canal and external ear normal. There is no impacted cerumen.     Left Ear: Tympanic membrane,  ear canal and external ear normal. There is no impacted cerumen.     Nose: Nose normal. No congestion or rhinorrhea.     Mouth/Throat:     Mouth: Mucous membranes are moist.     Pharynx: Oropharynx is clear. No oropharyngeal exudate or posterior oropharyngeal erythema.  Eyes:     General: No scleral icterus.       Right eye: No discharge.        Left eye: No discharge.     Extraocular Movements: Extraocular movements intact.     Conjunctiva/sclera: Conjunctivae normal.     Pupils: Pupils are equal, round, and reactive to light.  Neck:     Vascular: No carotid bruit.  Cardiovascular:     Rate and Rhythm: Normal rate and regular rhythm.     Pulses: Normal pulses.     Heart sounds: Normal heart sounds. No murmur heard.    No friction rub. No gallop.  Pulmonary:     Effort: Pulmonary effort is normal. No respiratory distress.     Breath sounds: Normal breath sounds. No wheezing, rhonchi or rales.  Chest:     Chest wall: No tenderness.  Abdominal:     General: Bowel sounds are normal. There is no distension.     Palpations: Abdomen is soft. There is no mass.     Tenderness: There is abdominal tenderness in the right upper quadrant. There is no right CVA tenderness, left CVA tenderness, guarding or rebound.  Musculoskeletal:        General: No swelling or tenderness. Normal range of motion.     Cervical back: Normal range of motion. No rigidity or tenderness.     Right lower leg: No edema.     Left lower leg: No edema.  Lymphadenopathy:     Cervical: No cervical adenopathy.  Skin:    General: Skin is warm and dry.     Coloration: Skin is not pale.     Findings: No bruising, erythema, lesion or rash.  Neurological:     Mental Status: She is alert and oriented to person, place, and time.     Cranial Nerves: No cranial nerve deficit.     Sensory: No sensory deficit.     Motor: No weakness.     Coordination: Coordination normal.     Gait: Gait normal.  Psychiatric:        Mood  and Affect: Mood normal.        Speech: Speech normal.        Behavior: Behavior normal.        Thought Content: Thought content normal.        Judgment: Judgment normal.  Labs reviewed: Recent Labs    11/06/21 0836 06/08/22 1013 06/22/22 1417  NA 136 137 139  K 4.3 4.3 4.3  CL 97* 101 104  CO2 _0 GLUCOSE 282* 156* 220*  BUN _1 CREATININE 0.76 0.89 0.84  CALCIUM 10.7* 9.7 9.9   Recent Labs    06/08/22 1013 06/22/22 1417 07/11/22 0945  AST 44* 53* 35  ALT 37* 46* 25  BILITOT 0.3 0.3 0.4  PROT 7.7 7.4 7.7   Recent Labs    11/06/21 0836 06/08/22 1013  WBC 9.2 10.6  NEUTROABS 5,658 6,890  HGB 14.5 12.8  HCT 44.6 37.8  MCV 82.6 80.3  PLT 392 371   Lab Results  Component Value Date   TSH 3.36 06/08/2022   Lab Results  Component Value Date   HGBA1C 8.9 (H) 06/08/2022   Lab Results  Component Value Date   CHOL 205 (H) 06/08/2022   HDL 43 (L) 06/08/2022   LDLCALC 139 (H) 06/08/2022   TRIG 112 06/08/2022   CHOLHDL 4.8 06/08/2022    Significant Diagnostic Results in last 30 days:  No results found.  Assessment/Plan 1. Right upper quadrant abdominal pain RUQ tender to palpate Discussed obtaining CT of the abdomen to rule out any acute abnormalities - CT Abdomen Pelvis Wo Contrast; Future  2. Mixed hyperlipidemia LDL 139 Currently off statin due to elevated liver enzymes Recently started exercising in the gym Advised to continue with dietary modification and exercise - Lipid panel; Future  3. Essential hypertension Blood pressure well controlled Continue on Metroprolol, lisinopril and hydrochlorothiazide - COMPLETE METABOLIC PANEL WITH GFR; Future - CBC with Differential/Platelet; Future  4. Acquired hypothyroidism Lab Results  Component Value Date   TSH 3.36 06/08/2022   - TSH; Future  5. Type 2 diabetes mellitus with hyperglycemia, without long-term current use of insulin (HCC) Lab Results  Component Value Date   HGBA1C  8.9 (H) 06/08/2022  Recently started exercising in the gym -Encouraged to continue with dietary modification and exercise We will continue on metformin and Lantus -Due for annual eye exam we will schedule follow-up with ophthalmologist Continue on ACE inhibitor for renal protection -Off statin due to elevated liver enzymes - Hemoglobin A1c; Future  6. Need for influenza vaccination Afebrile Flut shot administered by CMA no acute reaction reported.  - Flu Vaccine QUAD High Dose(Fluad)  Family/ staff Communication: Reviewed plan of care with patient verbalized understanding  Labs/tests ordered:  - CT Abdomen Pelvis Wo Contrast; Future - CBC with Differential/Platelet - CMP with eGFR([facility]) - TSH - Hgb A1C - Lipid panel  Next Appointment: Return in about 4 months (around 12/07/2022) for Fasting labs .   Sandrea Hughs, NP

## 2022-08-31 ENCOUNTER — Other Ambulatory Visit: Payer: Self-pay

## 2022-08-31 DIAGNOSIS — R1011 Right upper quadrant pain: Secondary | ICD-10-CM

## 2022-09-03 ENCOUNTER — Other Ambulatory Visit: Payer: 59

## 2022-09-03 ENCOUNTER — Other Ambulatory Visit: Payer: Self-pay | Admitting: Family

## 2022-09-03 ENCOUNTER — Telehealth: Payer: Self-pay

## 2022-09-03 DIAGNOSIS — E1165 Type 2 diabetes mellitus with hyperglycemia: Secondary | ICD-10-CM

## 2022-09-03 NOTE — Telephone Encounter (Signed)
Patient called upset about canceled CT appointment due to no authorization. Patient is aware that I have informed the practice administrator, Nuala Alpha and she will follow-up with referral coordinator. I plan to call patient back with a status update before the end of the day

## 2022-09-03 NOTE — Telephone Encounter (Signed)
Patient has request refill on medication Metformin '500mg'$ . Patient takes 2 tablets by mouth twice daily and receives 90 tablets. Medication pend and sent to PCP Ngetich, Nelda Bucks, NP

## 2022-09-04 NOTE — Telephone Encounter (Signed)
Faythe Dingwall spoke with patient yesterday

## 2022-09-07 ENCOUNTER — Ambulatory Visit (HOSPITAL_COMMUNITY)
Admission: RE | Admit: 2022-09-07 | Discharge: 2022-09-07 | Disposition: A | Discharge status: Home / Self Care | Payer: 59 | Source: Ambulatory Visit | Attending: Family | Admitting: Family

## 2022-09-07 DIAGNOSIS — R1011 Right upper quadrant pain: Secondary | ICD-10-CM | POA: Insufficient documentation

## 2022-09-12 ENCOUNTER — Telehealth: Payer: 59 | Admitting: Family

## 2022-09-13 ENCOUNTER — Telehealth (INDEPENDENT_AMBULATORY_CARE_PROVIDER_SITE_OTHER): Payer: 59 | Admitting: Family

## 2022-09-13 ENCOUNTER — Encounter: Payer: Self-pay | Admitting: Family

## 2022-09-13 DIAGNOSIS — K819 Cholecystitis, unspecified: Secondary | ICD-10-CM | POA: Diagnosis not present

## 2022-09-13 DIAGNOSIS — I700 Atherosclerosis of aorta: Secondary | ICD-10-CM

## 2022-09-13 DIAGNOSIS — R1011 Right upper quadrant pain: Secondary | ICD-10-CM | POA: Diagnosis not present

## 2022-09-13 NOTE — Progress Notes (Signed)
Provider: Marlowe Sax FNP-C  Jaskarn Schweer, Nelda Bucks, NP  Patient Care Team: Ashtyn Freilich, Nelda Bucks, NP as PCP - General (Family Medicine) Janith Lima, MD  Extended Emergency Contact Information Primary Emergency Contact: [facility] Address: [address]          [place], [place] [address] Janet Marquez of [place] Phone: [phone] Relation: Mother  Code Status: Full Code  Goals of care: Advanced Directive information    09/13/2022    1:01 PM  Advanced Directives  Does Patient Have a Medical Advance Directive? No  Would patient like information on creating a medical advance directive? No - Patient declined     Chief Complaint  Patient presents with   Acute Visit    Patient states she would like to discuss her CT scan results     HPI:  Pt is a 51 y.o. female seen today for an acute visit for discuss CT scan results done 11/24/203 which showed Mild bowel wall thickening of the rectum without significant surrounding inflammatory changes which was was considered to be nonspecific and may be infectious/inflammatory in etiology.Cholelithiasis without evidence of acute cholecystitis was also noted.Also indicated Aortic Atherosclerosis. She complains of RUQ pain would like Gall bladder evaluated. She is off statin currently due to elevated liver enzymes while on statin.   Past Medical History:  Diagnosis Date   Acid reflux    Anemia    Anxiety    Diabetes mellitus ([place])    Hx of cardiovascular stress test    ETT-echo 3/16: Normal   Hyperlipidemia    Hypertension    Inflammatory bowel diseases (IBD)    Thyroid disease    Vertigo    Past Surgical History:  Procedure Laterality Date   BILATERAL SALPINGECTOMY Left 01/20/2015   Procedure: LAPAROSCOPIC LEFT SALPINGECTOMY;  Surgeon: Everlene Farrier, MD;  Location: [facility];  Service: Gynecology;  Laterality: Left;   CESAREAN SECTION  2006   LAPAROSCOPIC ASSISTED VAGINAL HYSTERECTOMY N/A 01/20/2015   Procedure:  LAPAROSCOPIC ASSISTED VAGINAL HYSTERECTOMY;  Surgeon: Everlene Farrier, MD;  Location: [facility];  Service: Gynecology;  Laterality: N/A;   MYOMECTOMY     2002    Allergies  Allergen Reactions   Clarithromycin Nausea And Vomiting    Outpatient Encounter Medications as of 09/13/2022  Medication Sig   Cholecalciferol 1.25 MG (50000 UT) capsule Take 1 capsule (50,000 Units total) by mouth once a week.   fluticasone (FLONASE) 50 MCG/ACT nasal spray Place 1 spray into both nostrils as needed for allergies or rhinitis.   hydrochlorothiazide (HYDRODIURIL) 12.5 MG tablet Take 1 tablet (12.5 mg total) by mouth daily.   insulin glargine (LANTUS SOLOSTAR) 100 UNIT/ML Solostar Pen Inject 26 Units into the skin at bedtime.   Insulin Pen Needle (PEN NEEDLES) 32G X 4 MM MISC 1 Piece by Does not apply route in the morning, at noon, in the evening, and at bedtime.   lisinopril (ZESTRIL) 5 MG tablet Take 1 tablet (5 mg total) by mouth daily.   loratadine (CLARITIN) 10 MG tablet Take 1 tablet (10 mg total) by mouth daily.   metFORMIN (GLUCOPHAGE) 500 MG tablet TAKE 2 TABLETS BY MOUTH TWICE DAILY WITH MEALS . APPOINTMENT REQUIRED FOR FUTURE REFILLS   metoprolol tartrate (LOPRESSOR) 100 MG tablet Take 1 tablet (100 mg total) by mouth 2 (two) times daily.   Polyethyl Glycol-Propyl Glycol (SYSTANE) 0.4-0.3 % SOLN Apply 1 drop to eye as needed.   escitalopram (LEXAPRO) 10 MG tablet Take 1 tablet (10  mg total) by mouth daily.   No facility-administered encounter medications on file as of 09/13/2022.    Review of Systems  Constitutional:  Negative for appetite change, chills, fatigue and fever.  Respiratory:  Negative for cough, chest tightness, shortness of breath and wheezing.   Cardiovascular:  Negative for chest pain and palpitations.  Gastrointestinal:  Positive for abdominal pain. Negative for constipation, diarrhea, nausea, rectal pain and vomiting.       RUQ pain   Neurological:  Negative for dizziness and  headaches.    Immunization History  Administered Date(s) Administered   Fluad Quad(high Dose 65+) 08/22/2022   Hepatitis B 08/26/2021   Influenza, Seasonal, Injecte, Preservative Fre 11/23/2014, 07/10/2015   Influenza,inj,Quad PF,6+ Mos 11/23/2014, 07/10/2015, 07/18/2021   Influenza-Unspecified 11/23/2014, 07/10/2015, 08/03/2016   PFIZER(Purple Top)SARS-COV-2 Vaccination 12/29/2019, 02/09/2020, 08/08/2020, 03/05/2021   PNEUMOCOCCAL CONJUGATE-20 07/18/2021   Pfizer Covid-19 Vaccine Bivalent Booster 64yr & up 07/18/2021   Pneumococcal Polysaccharide-23 08/16/2016   Td 10/16/1999   Td (Adult), 2 Lf Tetanus Toxid, Preservative Free 10/16/1999   Tdap 04/03/2013, 08/26/2021   Zoster Recombinat (Shingrix) 03/05/2021, 07/18/2021   Pertinent  Health Maintenance Due  Topic Date Due   OPHTHALMOLOGY EXAM  Never done   COLONOSCOPY (Pts 45-459yrInsurance coverage will need to be confirmed)  Never done   PAP SMEAR-Modifier  12/07/2017   FOOT EXAM  11/06/2022   HEMOGLOBIN A1C  12/09/2022   MAMMOGRAM  05/22/2024   INFLUENZA VACCINE  Completed      06/08/2022    9:33 AM 06/11/2022    9:56 AM 07/11/2022    9:17 AM 08/22/2022   12:34 PM 09/13/2022    1:01 PM  Fall Risk  Falls in the past year? 0 0 0 0 0  Was there an injury with Fall? 0 0 0 0 0  Fall Risk Category Calculator 0 0 0 0 0  Fall Risk Category Low Low Low Low Low  Patient Fall Risk Level Low fall risk Low fall risk Low fall risk Low fall risk Low fall risk  Patient at Risk for Falls Due to No Fall Risks No Fall Risks No Fall Risks No Fall Risks No Fall Risks  Fall risk Follow up Falls evaluation completed Falls evaluation completed Falls evaluation completed Falls evaluation completed Falls evaluation completed   Functional Status Survey:    There were no vitals filed for this visit. There is no height or weight on file to calculate BMI. Physical Exam Pulmonary:     Effort: Pulmonary effort is normal. No respiratory distress.   Neurological:     Mental Status: She is alert and oriented to person, place, and time.  Psychiatric:        Mood and Affect: Mood normal.        Behavior: Behavior normal.     Labs reviewed: Recent Labs    11/06/21 0836 06/08/22 1013 06/22/22 1417  NA 136 137 139  K 4.3 4.3 4.3  CL 97* 101 104  CO2 '30 20 23  '$ GLUCOSE 282* 156* 220*  BUN '9 11 9  '$ CREATININE 0.76 0.89 0.84  CALCIUM 10.7* 9.7 9.9   Recent Labs    06/08/22 1013 06/22/22 1417 07/11/22 0945  AST 44* 53* 35  ALT 37* 46* 25  BILITOT 0.3 0.3 0.4  PROT 7.7 7.4 7.7   Recent Labs    11/06/21 0836 06/08/22 1013  WBC 9.2 10.6  NEUTROABS 5,658 6,890  HGB 14.5 12.8  HCT 44.6 37.8  MCV 82.6  80.3  PLT 392 371   Lab Results  Component Value Date   TSH 3.36 06/08/2022   Lab Results  Component Value Date   HGBA1C 8.9 (H) 06/08/2022   Lab Results  Component Value Date   CHOL 205 (H) 06/08/2022   HDL 43 (L) 06/08/2022   LDLCALC 139 (H) 06/08/2022   TRIG 112 06/08/2022   CHOLHDL 4.8 06/08/2022    Significant Diagnostic Results in last 30 days:  CT ABDOMEN PELVIS WO CONTRAST  Result Date: 09/07/2022 CLINICAL DATA:  Right lower quadrant abdominal pain and diarrhea on and off for 6 months. EXAM: CT ABDOMEN AND PELVIS WITHOUT CONTRAST TECHNIQUE: Multidetector CT imaging of the abdomen and pelvis was performed following the standard protocol without IV contrast. RADIATION DOSE REDUCTION: This exam was performed according to the departmental dose-optimization program which includes automated exposure control, adjustment of the mA and/or kV according to patient size and/or use of iterative reconstruction technique. COMPARISON:  Abdominal ultrasound dated 09/22/2004. FINDINGS: Lower chest: No acute abnormality. Hepatobiliary: No focal liver abnormality is seen. Gallstones are seen in the gallbladder neck. No gallbladder wall thickening or biliary dilatation. Pancreas: Unremarkable. No pancreatic ductal dilatation or  surrounding inflammatory changes. Spleen: Normal in size without focal abnormality. Adrenals/Urinary Tract: Adrenal glands are unremarkable. Kidneys are normal, without renal calculi, focal lesion, or hydronephrosis. Bladder is unremarkable. Stomach/Bowel: Stomach is within normal limits. Appendix appears normal. There is mild bowel wall thickening of the rectum without significant surrounding inflammatory changes. No evidence of bowel obstruction. Enteric contrast reaches the transverse colon. Vascular/Lymphatic: Aortic atherosclerosis. No enlarged abdominal or pelvic lymph nodes. Reproductive: Status post hysterectomy. No adnexal masses. Other: No abdominal wall hernia or abnormality. No abdominopelvic ascites. Musculoskeletal: No acute or significant osseous findings. IMPRESSION: 1. Mild bowel wall thickening of the rectum without significant surrounding inflammatory changes. Findings are nonspecific and may be infectious/inflammatory in etiology. 2. Cholelithiasis without evidence of acute cholecystitis. Aortic Atherosclerosis (ICD10-I70.0). Electronically Signed   By: Zerita Boers M.D.   On: 09/07/2022 16:58    Assessment/Plan 1. Aortic atherosclerosis ([facility]) Noted on Abdominal CT scan done 09/07/2022  Latest LDL 139 Currently off statin due to elevated liver enzymes.  Discussed referral to cardiology for further evaluation.  - Dietary modification and exercise discussed at length. - Ambulatory referral to Cardiology  2. Cholecystitis Per CT scan.continue to complain of RUQ pain  Will refer to general surgery for evaluation  - Ambulatory referral to General Surgery  3. Right upper quadrant abdominal pain Possible due to cholecystitis Will refer to general surgery as above  - Ambulatory referral to General Surgery  Family/ staff Communication: Reviewed plan of care with patient verbalized understanding   Labs/tests ordered: None   Next Appointment: Return in 1 year (on 09/14/2023), or if  symptoms worsen or fail to improve.  I connected with  Grace Isaac on 09/13/22 by a video enabled telemedicine application and verified that I am speaking with the correct person using two identifiers.   I discussed the limitations of evaluation and management by telemedicine. The patient expressed understanding and agreed to proceed.  Spent 12 minutes of face to face with patient  >50% time spent counseling; reviewing medical record; tests; labs; and developing future plan of care.   Sandrea Hughs, NP

## 2022-09-21 NOTE — Progress Notes (Signed)
CARDIOLOGY CONSULT NOTE       Patient ID: Janet Marquez MRN: [id number] DOB/AGE: 12/15/1970 51 y.o.  Admit date: (Not on file) Referring Physician: Ngetich Primary Physician: Sandrea Hughs, NP Primary Cardiologist: New Reason for Consultation: Aortic Atherosclerosis  Active Problems:   * No active hospital problems. *   HPI:  51 y.o. referred by NP Ngetich for aortic atherosclerosis has been w/u for cholelithiasis and abdominal CT done 09/07/22 indicated aortic atherosclerosis CT did not suggest acute cholelithiasis but she has been referred to general surgery for consideration of cholecystectomy due to continued RUQ pain  Her LFTls were mildly elevated on statin and they have been stopped AST 53 and ALT 46 most recently 07/11/22 back to normal   CRF;s also include HTN, DM She is a non smoker  Her DM is poorly controlled with A1c 8.9 06/08/22 LDL 139 06/08/22   She denies SSCP, dyspnea palpitations or syncope   Review of her CT shows minimal calcific atherosclerosis of the distal aorta and iliacs. 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 [employer] care of her mom and also has 2 yo son at home He's at [employer] and doing a trade G[employer] year at [employer] other systems reviewed and negative except as noted above  Past Medical History:  Diagnosis Date   Acid reflux    Anemia    Anxiety    Diabetes mellitus ([place])    Hx of cardiovascular stress test    ETT-echo 3/16: Normal   Hyperlipidemia    Hypertension    Inflammatory bowel diseases (IBD)    Thyroid disease    Vertigo     Family History  Problem Relation Age of Onset   Diabetes Mother    Hypertension Mother    Diverticulosis Mother    Arthritis Mother    Hypertension Maternal Grandmother    Stroke Maternal Grandmother    Dementia Maternal Grandmother    Cancer Maternal Grandfather    Heart attack Maternal Grandfather      Social History   Socioeconomic History   Marital status: Single    Spouse name: Not on file   Number of children: Not on file   Years of education: Not on file   Highest education level: Not on file  Occupational History   Not on file  Tobacco Use   Smoking status: Never   Smokeless tobacco: Never  Vaping Use   Vaping Use: Never used  Substance and Sexual Activity   Alcohol use: Yes    Comment: occasionally   Drug use: Never   Sexual activity: Not on file  Other Topics Concern   Not on file  Social History Narrative   Diet:       Caffeine:yes      Married, if yes what year: Single      Do you live in a house, apartment, assisted living, condo, trailer, ect: House      Is it one or more stories: 1      How many persons live in your home? 3      Pets: No      Highest level or education completed: some college      Current/Past profession: claims specialist       Exercise: Yes                 Type and how often:  ride bike, [place], treadmill few times a week         Living Will: No   DNR: No   POA/HPOA: No      Functional Status:   Do you have difficulty bathing or dressing yourself? No   Do you have difficulty preparing food or eating? No   Do you have difficulty managing your medications? No   Do you have difficulty managing your finances? No   Do you have difficulty affording your medications? Yes   Social Determinants of Health   Financial Resource Strain: Not on file  Food Insecurity: Not on file  Transportation Needs: Not on file  Physical Activity: Not on file  Stress: Not on file  Social Connections: Not on file  Intimate Partner Violence: Not on file    Past Surgical History:  Procedure Laterality Date   BILATERAL SALPINGECTOMY Left 01/20/2015   Procedure: LAPAROSCOPIC LEFT SALPINGECTOMY;  Surgeon: Everlene Farrier, MD;  Location: [facility];  Service: Gynecology;  Laterality: Left;   CESAREAN SECTION  2006   LAPAROSCOPIC ASSISTED VAGINAL  HYSTERECTOMY N/A 01/20/2015   Procedure: LAPAROSCOPIC ASSISTED VAGINAL HYSTERECTOMY;  Surgeon: Everlene Farrier, MD;  Location: [facility];  Service: Gynecology;  Laterality: N/A;   MYOMECTOMY     2002      Current Outpatient Medications:    Cholecalciferol 1.25 MG (50000 UT) capsule, Take 1 capsule (50,000 Units total) by mouth once a week., Disp: 6 capsule, Rfl: 1   escitalopram (LEXAPRO) 10 MG tablet, Take 1 tablet (10 mg total) by mouth daily., Disp: 90 tablet, Rfl: 0   fluticasone (FLONASE) 50 MCG/ACT nasal spray, Place 1 spray into both nostrils as needed for allergies or rhinitis., Disp: 16 g, Rfl: 5   hydrochlorothiazide (HYDRODIURIL) 12.5 MG tablet, Take 1 tablet (12.5 mg total) by mouth daily., Disp: 90 tablet, Rfl: 1   insulin glargine (LANTUS SOLOSTAR) 100 UNIT/ML Solostar Pen, Inject 26 Units into the skin at bedtime., Disp: 15 mL, Rfl: 2   Insulin Pen Needle (PEN NEEDLES) 32G X 4 MM MISC, 1 Piece by Does not apply route in the morning, at noon, in the evening, and at bedtime., Disp: 100 each, Rfl: 11   lisinopril (ZESTRIL) 5 MG tablet, Take 1 tablet (5 mg total) by mouth daily., Disp: 90 tablet, Rfl: 1   loratadine (CLARITIN) 10 MG tablet, Take 1 tablet (10 mg total) by mouth daily., Disp: 30 tablet, Rfl: 11   metFORMIN (GLUCOPHAGE) 500 MG tablet, TAKE 2 TABLETS BY MOUTH TWICE DAILY WITH MEALS . APPOINTMENT REQUIRED FOR FUTURE REFILLS, Disp: 90 tablet, Rfl: 0   metoprolol tartrate (LOPRESSOR) 100 MG tablet, Take 1 tablet (100 mg total) by mouth 2 (two) times daily., Disp: 180 tablet, Rfl: 1   Polyethyl Glycol-Propyl Glycol (SYSTANE) 0.4-0.3 % SOLN, Apply 1 drop to eye as needed., Disp: , Rfl:     Physical Exam: Last menstrual period 11/21/2014.   Affect appropriate Healthy:  appears stated age 11: normal Neck supple with no adenopathy JVP normal no bruits no thyromegaly Lungs clear with no wheezing and good diaphragmatic motion Heart:  S1/S2 no murmur, no rub, gallop or  click PMI normal Abdomen: benighn, BS positve, no tenderness, no AAA no bruit.  No HSM or HJR Distal pulses intact with no bruits No edema Neuro non-focal Skin warm and dry No muscular weakness   Labs:   Lab Results  Component Value Date   WBC 10.6 06/08/2022   HGB 12.8 06/08/2022   HCT 37.8  06/08/2022   MCV 80.3 06/08/2022   PLT 371 06/08/2022   No results for input(s): "NA", "K", "CL", "CO2", "BUN", "CREATININE", "CALCIUM", "PROT", "BILITOT", "ALKPHOS", "ALT", "AST", "GLUCOSE" in the last 168 hours.  Invalid input(s): "LABALBU" Lab Results  Component Value Date   TROPONINI <0.03 11/23/2014    Lab Results  Component Value Date   CHOL 205 (H) 06/08/2022   CHOL 205 (H) 11/06/2021   Lab Results  Component Value Date   HDL 43 (L) 06/08/2022   HDL 45 (L) 11/06/2021   Lab Results  Component Value Date   LDLCALC 139 (H) 06/08/2022   LDLCALC 134 (H) 11/06/2021   Lab Results  Component Value Date   TRIG 112 06/08/2022   TRIG 131 11/06/2021   Lab Results  Component Value Date   CHOLHDL 4.8 06/08/2022   CHOLHDL 4.6 11/06/2021   No results found for: "LDLDIRECT"    Radiology: CT ABDOMEN PELVIS WO CONTRAST  Result Date: 09/07/2022 CLINICAL DATA:  Right lower quadrant abdominal pain and diarrhea on and off for 6 months. EXAM: CT ABDOMEN AND PELVIS WITHOUT CONTRAST TECHNIQUE: Multidetector CT imaging of the abdomen and pelvis was performed following the standard protocol without IV contrast. RADIATION DOSE REDUCTION: This exam was performed according to the departmental dose-optimization program which includes automated exposure control, adjustment of the mA and/or kV according to patient size and/or use of iterative reconstruction technique. COMPARISON:  Abdominal ultrasound dated 09/22/2004. FINDINGS: Lower chest: No acute abnormality. Hepatobiliary: No focal liver abnormality is seen. Gallstones are seen in the gallbladder neck. No gallbladder wall thickening or biliary  dilatation. Pancreas: Unremarkable. No pancreatic ductal dilatation or surrounding inflammatory changes. Spleen: Normal in size without focal abnormality. Adrenals/Urinary Tract: Adrenal glands are unremarkable. Kidneys are normal, without renal calculi, focal lesion, or hydronephrosis. Bladder is unremarkable. Stomach/Bowel: Stomach is within normal limits. Appendix appears normal. There is mild bowel wall thickening of the rectum without significant surrounding inflammatory changes. No evidence of bowel obstruction. Enteric contrast reaches the transverse colon. Vascular/Lymphatic: Aortic atherosclerosis. No enlarged abdominal or pelvic lymph nodes. Reproductive: Status post hysterectomy. No adnexal masses. Other: No abdominal wall hernia or abnormality. No abdominopelvic ascites. Musculoskeletal: No acute or significant osseous findings. IMPRESSION: 1. Mild bowel wall thickening of the rectum without significant surrounding inflammatory changes. Findings are nonspecific and may be infectious/inflammatory in etiology. 2. Cholelithiasis without evidence of acute cholecystitis. Aortic Atherosclerosis (ICD10-I70.0). Electronically Signed   By: Zerita Boers M.D.   On: 09/07/2022 16:58    EKG: ST rate 115 nonspecific ST changes 07/28/21  10/01/2022 ST 108 normal otherwise   ASSESSMENT AND PLAN:   Aortic atherosclerosis:  mild in distal thoracic aorta and proximal iliac arteries Limited views of coronary arteries on abdominal CT with no coronary calcium. Discussed utility of full coronary calcium score to verify a calcium score of 0   HLD: elevated LFTls with statin mild. Return to normal No obstructive GB signs to explain Discussed alternatives to statins such as nexlitol and PSK9 refer to lipid clinic  GB:  had referral to general surgery [place] to have general anesthesia and operation if deemed necessary  DM:  poor control likely etiology of atherosclerosis more than lipids f/u primary target A1c < 6.5   HTN well controlled continue current meds  Sinus Tachycardia:  seems to have high resting HR check echo for EF and effusion   Refer lipid clinic  Coronary calcium score TTE   F/U PRN   Signed: Jenkins Rouge 09/21/2022, 3:17  PM   

## 2022-09-27 ENCOUNTER — Other Ambulatory Visit: Payer: 59

## 2022-10-01 ENCOUNTER — Ambulatory Visit: Payer: 59 | Attending: Cardiovascular Disease | Admitting: Cardiovascular Disease

## 2022-10-01 ENCOUNTER — Encounter: Payer: Self-pay | Admitting: Cardiovascular Disease

## 2022-10-01 VITALS — BP 138/88 | HR 111 | Ht 67.5 in | Wt 193.0 lb

## 2022-10-01 DIAGNOSIS — Z794 Long term (current) use of insulin: Secondary | ICD-10-CM

## 2022-10-01 DIAGNOSIS — R000 Tachycardia, unspecified: Secondary | ICD-10-CM | POA: Diagnosis not present

## 2022-10-01 DIAGNOSIS — E1165 Type 2 diabetes mellitus with hyperglycemia: Secondary | ICD-10-CM | POA: Diagnosis not present

## 2022-10-01 DIAGNOSIS — R7989 Other specified abnormal findings of blood chemistry: Secondary | ICD-10-CM

## 2022-10-01 DIAGNOSIS — I700 Atherosclerosis of aorta: Secondary | ICD-10-CM

## 2022-10-01 DIAGNOSIS — E782 Mixed hyperlipidemia: Secondary | ICD-10-CM

## 2022-10-01 DIAGNOSIS — I10 Essential (primary) hypertension: Secondary | ICD-10-CM | POA: Diagnosis not present

## 2022-10-01 NOTE — Patient Instructions (Signed)
Medication Instructions:  Your physician recommends that you continue on your current medications as directed. Please refer to the Current Medication list given to you today.  *If you need a refill on your cardiac medications before your next appointment, please call your pharmacy*  Lab Work: If you have labs (blood work) drawn today and your tests are completely normal, you will receive your results only by: [place] (if you have MyChart) OR A paper copy in the mail If you have any lab test that is abnormal or we need to change your treatment, we will call you to review the results.  Testing/Procedures: Your physician has requested that you have an echocardiogram before the end of the year. Echocardiography is a painless test that uses sound waves to create images of your heart. It provides your doctor with information about the size and shape of your heart and how well your heart's chambers and valves are working. This procedure takes approximately one hour. There are no restrictions for this procedure. Please do NOT wear cologne, perfume, aftershave, or lotions (deodorant is allowed). Please arrive 15 minutes prior to your appointment time.  Cardiac CT scanning for calcium score (CAT scanning), is a noninvasive, special x-ray that produces cross-sectional images of the body using x-rays and a computer. CT scans help physicians diagnose and treat medical conditions. For some CT exams, a contrast material is used to enhance visibility in the area of the body being studied. CT scans provide greater clarity and reveal more details than regular x-ray exams.  Follow-Up: At [facility], you and your health needs are our priority.  As part of our continuing mission to provide you with exceptional heart care, we have created designated Provider Care Teams.  These Care Teams include your primary Cardiologist (physician) and Advanced Practice Providers (APPs -  Physician Assistants and  Nurse Practitioners) who all work together to provide you with the care you need, when you need it.  We recommend signing up for the patient portal called "MyChart".  Sign up information is provided on this After Visit Summary.  MyChart is used to connect with patients for Virtual Visits (Telemedicine).  Patients are able to view lab/test results, encounter notes, upcoming appointments, etc.  Non-urgent messages can be sent to your provider as well.   To learn more about what you can do with MyChart, go to [email or web address].    Your next appointment:   As needed  The format for your next appointment:   In Person  Provider:   Jenkins Rouge, MD     Other Instructions You have been referred to [facility].   Important Information About Sugar

## 2022-10-04 ENCOUNTER — Ambulatory Visit (HOSPITAL_COMMUNITY)
Admission: RE | Admit: 2022-10-04 | Discharge: 2022-10-04 | Disposition: A | Discharge status: Home / Self Care | Payer: 59 | Source: Ambulatory Visit | Attending: Cardiovascular Disease | Admitting: Cardiovascular Disease

## 2022-10-04 DIAGNOSIS — R000 Tachycardia, unspecified: Secondary | ICD-10-CM | POA: Diagnosis present

## 2022-10-04 DIAGNOSIS — I10 Essential (primary) hypertension: Secondary | ICD-10-CM | POA: Insufficient documentation

## 2022-10-04 DIAGNOSIS — E1165 Type 2 diabetes mellitus with hyperglycemia: Secondary | ICD-10-CM | POA: Insufficient documentation

## 2022-10-04 DIAGNOSIS — R7989 Other specified abnormal findings of blood chemistry: Secondary | ICD-10-CM | POA: Diagnosis not present

## 2022-10-04 DIAGNOSIS — E782 Mixed hyperlipidemia: Secondary | ICD-10-CM | POA: Diagnosis not present

## 2022-10-04 DIAGNOSIS — Z794 Long term (current) use of insulin: Secondary | ICD-10-CM | POA: Diagnosis not present

## 2022-10-04 DIAGNOSIS — I700 Atherosclerosis of aorta: Secondary | ICD-10-CM | POA: Diagnosis not present

## 2022-10-04 DIAGNOSIS — I340 Nonrheumatic mitral (valve) insufficiency: Secondary | ICD-10-CM | POA: Insufficient documentation

## 2022-10-04 LAB — ECHOCARDIOGRAM COMPLETE
Area-P 1/2: 3.63 cm2
Calc EF: 51.8 %
MV M vel: 4.8 m/s
MV Peak grad: 92.2 mmHg
Radius: 0.4 cm
S' Lateral: 3.1 cm
Single Plane A2C EF: 48 %
Single Plane A4C EF: 57.6 %

## 2022-10-04 NOTE — Progress Notes (Signed)
  Echocardiogram 2D Echocardiogram has been performed.  Janet Marquez 10/04/2022, 9:01 AM

## 2022-10-06 ENCOUNTER — Other Ambulatory Visit: Payer: Self-pay | Admitting: Family

## 2022-10-06 DIAGNOSIS — E1165 Type 2 diabetes mellitus with hyperglycemia: Secondary | ICD-10-CM

## 2022-10-10 ENCOUNTER — Other Ambulatory Visit: Payer: Self-pay

## 2022-10-10 ENCOUNTER — Other Ambulatory Visit: Payer: Self-pay | Admitting: Family

## 2022-10-10 DIAGNOSIS — E1165 Type 2 diabetes mellitus with hyperglycemia: Secondary | ICD-10-CM

## 2022-10-10 DIAGNOSIS — I10 Essential (primary) hypertension: Secondary | ICD-10-CM

## 2022-10-10 MED ORDER — LANTUS SOLOSTAR 100 UNIT/ML ~~LOC~~ SOPN: 26.0000 [IU] | PEN_INJECTOR | Freq: Every day | SUBCUTANEOUS | 2 refills | Status: DC

## 2022-10-10 MED ORDER — METFORMIN HCL 500 MG PO TABS: ORAL_TABLET | ORAL | 1 refills | Status: DC

## 2022-10-10 MED ORDER — METOPROLOL TARTRATE 100 MG PO TABS: 100.0000 mg | ORAL_TABLET | Freq: Two times a day (BID) | ORAL | 1 refills | Status: DC

## 2022-11-07 ENCOUNTER — Other Ambulatory Visit: Payer: Self-pay

## 2022-11-07 DIAGNOSIS — E782 Mixed hyperlipidemia: Secondary | ICD-10-CM

## 2022-11-07 DIAGNOSIS — E1165 Type 2 diabetes mellitus with hyperglycemia: Secondary | ICD-10-CM

## 2022-11-07 DIAGNOSIS — E039 Hypothyroidism, unspecified: Secondary | ICD-10-CM

## 2022-11-07 DIAGNOSIS — I10 Essential (primary) hypertension: Secondary | ICD-10-CM

## 2022-11-13 ENCOUNTER — Ambulatory Visit: Payer: 59

## 2022-11-13 ENCOUNTER — Ambulatory Visit (HOSPITAL_BASED_OUTPATIENT_CLINIC_OR_DEPARTMENT_OTHER)
Admission: RE | Admit: 2022-11-13 | Discharge: 2022-11-13 | Disposition: A | Discharge status: Home / Self Care | Payer: 59 | Source: Ambulatory Visit | Attending: Cardiovascular Disease | Admitting: Cardiovascular Disease

## 2022-11-13 DIAGNOSIS — Z794 Long term (current) use of insulin: Secondary | ICD-10-CM | POA: Insufficient documentation

## 2022-11-13 DIAGNOSIS — I700 Atherosclerosis of aorta: Secondary | ICD-10-CM | POA: Insufficient documentation

## 2022-11-13 DIAGNOSIS — E1165 Type 2 diabetes mellitus with hyperglycemia: Secondary | ICD-10-CM | POA: Insufficient documentation

## 2022-11-13 DIAGNOSIS — R000 Tachycardia, unspecified: Secondary | ICD-10-CM | POA: Insufficient documentation

## 2022-11-13 DIAGNOSIS — R7989 Other specified abnormal findings of blood chemistry: Secondary | ICD-10-CM | POA: Insufficient documentation

## 2022-11-13 DIAGNOSIS — E782 Mixed hyperlipidemia: Secondary | ICD-10-CM | POA: Insufficient documentation

## 2022-11-13 DIAGNOSIS — I10 Essential (primary) hypertension: Secondary | ICD-10-CM | POA: Insufficient documentation

## 2022-11-14 ENCOUNTER — Telehealth: Payer: Self-pay

## 2022-11-14 DIAGNOSIS — I340 Nonrheumatic mitral (valve) insufficiency: Secondary | ICD-10-CM

## 2022-11-14 NOTE — Telephone Encounter (Signed)
The patient has been notified of the result and verbalized understanding.  All questions (if any) were answered. Michaelyn Barter, RN 11/14/2022 1:55 PM   Will place order for echo and office visit in one year.

## 2022-11-14 NOTE — Telephone Encounter (Signed)
-----  Message from Josue Hector, MD sent at 10/05/2022  8:14 AM EST ----- Low normal RV/LV function only mild MR overall ok f/u echo in a year will consider f/u stress test pending calcium score

## 2022-11-16 ENCOUNTER — Ambulatory Visit: Payer: 59

## 2022-11-30 ENCOUNTER — Ambulatory Visit: Payer: 59

## 2022-12-05 ENCOUNTER — Other Ambulatory Visit (INDEPENDENT_AMBULATORY_CARE_PROVIDER_SITE_OTHER): Payer: 59

## 2022-12-06 LAB — COMPLETE METABOLIC PANEL WITH GFR
AG Ratio: 1.6 (calc) (ref 1.0–2.5)
ALT: 40 U/L — ABNORMAL HIGH (ref 6–29)
AST: 40 U/L — ABNORMAL HIGH (ref 10–35)
Albumin: 4.6 g/dL (ref 3.6–5.1)
Alkaline phosphatase (APISO): 82 U/L (ref 37–153)
BUN: 7 mg/dL (ref 7–25)
CO2: 28 mmol/L (ref 20–32)
Calcium: 9.7 mg/dL (ref 8.6–10.4)
Chloride: 100 mmol/L (ref 98–110)
Creat: 0.75 mg/dL (ref 0.50–1.03)
Globulin: 2.9 g/dL (calc) (ref 1.9–3.7)
Glucose, Bld: 105 mg/dL — ABNORMAL HIGH (ref 65–99)
Potassium: 4 mmol/L (ref 3.5–5.3)
Sodium: 139 mmol/L (ref 135–146)
Total Bilirubin: 0.3 mg/dL (ref 0.2–1.2)
Total Protein: 7.5 g/dL (ref 6.1–8.1)
eGFR: 96 mL/min/{1.73_m2} (ref >=60)

## 2022-12-06 LAB — CBC WITH DIFFERENTIAL/PLATELET
Absolute Monocytes: 407 cells/uL (ref 200–950)
Basophils Absolute: 32 cells/uL (ref 0–200)
Basophils Relative: 0.3 %
Eosinophils Absolute: 64 cells/uL (ref 15–500)
Eosinophils Relative: 0.6 %
HCT: 36.1 % (ref 35.0–45.0)
Hemoglobin: 12.1 g/dL (ref 11.7–15.5)
Lymphs Abs: 3060 cells/uL (ref 850–3900)
MCH: 27 pg (ref 27.0–33.0)
MCHC: 33.5 g/dL (ref 32.0–36.0)
MCV: 80.6 fL (ref 80.0–100.0)
MPV: 9.6 fL (ref 7.5–12.5)
Monocytes Relative: 3.8 %
Neutro Abs: 7137 cells/uL (ref 1500–7800)
Neutrophils Relative %: 66.7 %
Platelets: 408 10*3/uL — ABNORMAL HIGH (ref 140–400)
RBC: 4.48 10*6/uL (ref 3.80–5.10)
RDW: 13.2 % (ref 11.0–15.0)
Total Lymphocyte: 28.6 %
WBC: 10.7 10*3/uL (ref 3.8–10.8)

## 2022-12-06 LAB — HEMOGLOBIN A1C
Hgb A1c MFr Bld: 7.5 % of total Hgb — ABNORMAL HIGH (ref <5.7)
Mean Plasma Glucose: 169 mg/dL
eAG (mmol/L): 9.3 mmol/L

## 2022-12-06 LAB — TSH: TSH: 3.8 mIU/L

## 2022-12-06 LAB — LIPID PANEL
Cholesterol: 205 mg/dL — ABNORMAL HIGH (ref <200)
HDL: 40 mg/dL — ABNORMAL LOW (ref >=50)
LDL Cholesterol (Calc): 140 mg/dL (calc) — ABNORMAL HIGH
Non-HDL Cholesterol (Calc): 165 mg/dL (calc) — ABNORMAL HIGH (ref <130)
Total CHOL/HDL Ratio: 5.1 (calc) — ABNORMAL HIGH (ref <5.0)
Triglycerides: 124 mg/dL (ref <150)

## 2022-12-07 ENCOUNTER — Ambulatory Visit: Payer: 59 | Attending: Cardiology | Admitting: Pharmacist

## 2022-12-07 DIAGNOSIS — E1165 Type 2 diabetes mellitus with hyperglycemia: Secondary | ICD-10-CM | POA: Diagnosis not present

## 2022-12-07 DIAGNOSIS — Z794 Long term (current) use of insulin: Secondary | ICD-10-CM

## 2022-12-07 DIAGNOSIS — E782 Mixed hyperlipidemia: Secondary | ICD-10-CM | POA: Diagnosis not present

## 2022-12-07 NOTE — Patient Instructions (Addendum)
Your LDL cholesterol is 140. We'd like it to be < 100, and < 70 is even better  I'll send your PCP a message to see if she's ok with you trying a low dose of rosuvastatin (Crestor)  You can think about trying one of the newer once weekly injections called Mounjaro. It's really effective in lowering your blood sugar, can help with weight loss 15-20%, and we can usually decrease your insulin as well

## 2022-12-07 NOTE — Progress Notes (Addendum)
Patient ID: LORAN AUGUSTE                 DOB: [birth year]                    MRN: [id number]     HPI: Janet Marquez is a 52 y.o. female patient referred to lipid clinic by Dr Eden Emms. PMH is significant for abdominal CT 08/2022 with mild aortic atherosclerosis in distal thoracic aorta and proximal iliac arteries, HTN, and DM. F/u calcium score 11/13/22 was 0.   Pt previously took atorvastatin however this was stopped 06/22/22 due to mildly elevated LFTs (ALT 46, AST 53 at the time). Normalized on f/u check 07/11/22. Increased again slightly on labs from 2 days ago despite being off of statin. Pt reports no alcohol use and no recent Tylenol use. Has not taken other lipid meds before.  Trying to lose weight. Previously took Ozempic for her DM but stopped due to GI issues. Has been making dietary improvements since then which could help with tolerability if she wanted to rechallenge. A1c improved to 7.5% 2 days ago, down from 8.9% 6 months ago and 11.9% 1 year ago. On metformin and insulin. Thinks she also tried either Januvia or Jardiance in the past but isn't sure which/why it was stopped.  Current Medications: none Intolerances: atorvastatin 20-40mg  daily - mild LFT elevation Risk Factors: aortic atherosclerosis on abdominal CT (CAC 0 though), DM, HTN LDL goal: < 100mg /dL since CAC is 0, could also target  70mg /dL since aortic atherosclerosis was seen on abdominal CT  Diet: Cutting out fried food, cutting back on red meat. More fish, baked meat, avocado, fruit. Wellness program through work for diabetics, wearing a CGM - dietary coaching.  Exercise: [employer].  Family History: Mother with HTN and DM. Maternal grandmother with HTN, stroke, and maternal grandfather with MI.  Social History: No tobacco or drug use, occasional alcohol use.  Labs: 12/05/22: TC 205, HDL 40, TG 124, LDL 140 (no LLT)  Past Medical History:  Diagnosis Date   Acid reflux    Anemia    Anxiety    Diabetes  mellitus (HCC)    Hx of cardiovascular stress test    ETT-echo 3/16: Normal   Hyperlipidemia    Hypertension    Inflammatory bowel diseases (IBD)    Thyroid disease    Vertigo     Current Outpatient Medications on File Prior to Visit  Medication Sig Dispense Refill   aspirin EC 81 MG tablet Take 81 mg by mouth daily. Swallow whole.     Cholecalciferol 1.25 MG (50000 UT) capsule Take 1 capsule (50,000 Units total) by mouth once a week. 6 capsule 1   escitalopram (LEXAPRO) 10 MG tablet Take 1 tablet (10 mg total) by mouth daily. 90 tablet 0   fluticasone (FLONASE) 50 MCG/ACT nasal spray Place 1 spray into both nostrils as needed for allergies or rhinitis. 16 g 5   hydrochlorothiazide (HYDRODIURIL) 12.5 MG tablet Take 1 tablet (12.5 mg total) by mouth daily. 90 tablet 1   insulin glargine (LANTUS SOLOSTAR) 100 UNIT/ML Solostar Pen Inject 26 Units into the skin at bedtime. 15 mL 2   Insulin Pen Needle (PEN NEEDLES) 32G X 4 MM MISC 1 Piece by Does not apply route in the morning, at noon, in the evening, and at bedtime. 100 each 11   lisinopril (ZESTRIL) 5 MG tablet Take 1 tablet (5 mg total) by mouth daily. 90 tablet 1  loratadine (CLARITIN) 10 MG tablet Take 1 tablet (10 mg total) by mouth daily. 30 tablet 11   metFORMIN (GLUCOPHAGE) 500 MG tablet TAKE 2 TABLETS BY MOUTH TWICE DAILY WITH MEALS . 360 tablet 1   metoprolol tartrate (LOPRESSOR) 100 MG tablet Take 1 tablet (100 mg total) by mouth 2 (two) times daily. 180 tablet 1   Polyethyl Glycol-Propyl Glycol (SYSTANE) 0.4-0.3 % SOLN Apply 1 drop to eye as needed.     No current facility-administered medications on file prior to visit.    Allergies  Allergen Reactions   Clarithromycin Nausea And Vomiting    Assessment/Plan:  1. Hyperlipidemia - LDL 140 at baseline above goal < 100 for primary prevention. Could target more aggressive goal < 70 given mild aortic atherosclerosis noted on abdominal CT, however follow up coronary artery  calcium score was 0. She had slight LFT increase on atorvastatin 20-40mg  daily which did normalize after she stopped atorvastatin, however increased again on lab check 2 days ago despite being on no statin. No alcohol or Tylenol use either. Should be ok to rechallenge with statin therapy, ideally rosuvastatin 5mg  daily for less LFT effects, also not sure that prior elevation was related to statin use given elevation again on labs from 2 days ago. Will confirm with PCP that she's ok with statin rechallenge. Not sure if pt will be getting referred to GI.   2. T2DM - Pt expressed interest in weight loss. Currently on metformin 1g BID and Lantus 26u daily, A1c improved from 11.9% (1 yr ago) to 8.9% (6 mo ago) to 7.5% 2 days ago. Prior GI issues on Ozempic. Discussed potential GLP1RA rechallenge with [facility] for more effective weight loss, plus insulin can typically be decreased/stopped potentially with GLP start which would further aid in weight loss. Discussed dietary tips that can help with GI tolerability. She'll think about this and bring up with her PCP if interested.  Emaly Boschert E. Dontrez Pettis, PharmD, BCACP, CPP [facility] [address] [place], [place] [address] Phone: [phone]; Fax: [phone] 12/07/2022 3:17 PM

## 2022-12-10 ENCOUNTER — Ambulatory Visit: Payer: 59 | Admitting: Family

## 2022-12-14 NOTE — Telephone Encounter (Signed)
Message routed to PCP Ngetich, Nelda Bucks, NP

## 2022-12-17 ENCOUNTER — Telehealth: Payer: Self-pay | Admitting: *Deleted

## 2022-12-17 ENCOUNTER — Encounter: Payer: Self-pay | Admitting: Family

## 2022-12-17 ENCOUNTER — Encounter: Payer: 59 | Admitting: Family

## 2022-12-17 NOTE — Progress Notes (Signed)
This encounter was created in error - please disregard. Patient rescheduled appointment.

## 2022-12-17 NOTE — Telephone Encounter (Signed)
Ms. Janet Marquez, Janet Marquez are scheduled for a virtual visit with your provider today.    Just as we do with appointments in the office, we must obtain your consent to participate.  Your consent will be active for this visit and any virtual visit you Janet Marquez have with one of our providers in the next 365 days.    If you have a MyChart account, I can also send a copy of this consent to you electronically.  All virtual visits are billed to your insurance company just like a traditional visit in the office.  As this is a virtual visit, video technology does not allow for your provider to perform a traditional examination.  This Janet Marquez limit your provider's ability to fully assess your condition.  If your provider identifies any concerns that need to be evaluated in person or the need to arrange testing such as labs, EKG, etc, we will make arrangements to do so.    Although advances in technology are sophisticated, we cannot ensure that it will always work on either your end or our end.  If the connection with a video visit is poor, we Janet Marquez have to switch to a telephone visit.  With either a video or telephone visit, we are not always able to ensure that we have a secure connection.   I need to obtain your verbal consent now.   Are you willing to proceed with your visit today?   Janet Marquez has provided verbal consent on 12/17/2022 for a virtual visit (video or telephone).   Janet Marquez, [place] 12/17/2022  1:38 PM

## 2022-12-19 ENCOUNTER — Encounter: Payer: Self-pay | Admitting: Pharmacist

## 2022-12-19 ENCOUNTER — Telehealth: Payer: Self-pay | Admitting: Family

## 2022-12-19 ENCOUNTER — Telehealth: Payer: Self-pay | Admitting: Pharmacist

## 2022-12-19 ENCOUNTER — Other Ambulatory Visit: Payer: Self-pay | Admitting: Family

## 2022-12-19 DIAGNOSIS — E782 Mixed hyperlipidemia: Secondary | ICD-10-CM

## 2022-12-19 DIAGNOSIS — F411 Generalized anxiety disorder: Secondary | ICD-10-CM

## 2022-12-19 MED ORDER — ROSUVASTATIN CALCIUM 5 MG PO TABS: 5.0000 mg | ORAL_TABLET | Freq: Every day | ORAL | 5 refills | Status: AC

## 2022-12-19 NOTE — Telephone Encounter (Signed)
I called patient, but no answer. I left message for patient to call office and schedule appointment.

## 2022-12-19 NOTE — Telephone Encounter (Signed)
Patient cancelled appointment but did not reschedule.please call patient to schedule appointment to discuss high liver enzymes.

## 2022-12-19 NOTE — Telephone Encounter (Signed)
I have sent rx for rosuvastatin '5mg'$  daily to pt's pharmacy. Called her and left her a message - will plan to keep a closer eye on labs and recheck lipids and LFTs in 4 weeks.

## 2022-12-19 NOTE — Telephone Encounter (Signed)
-----   Message from Sandrea Hughs, NP sent at 12/19/2022 12:23 PM EST ----- Regarding: RE: statin Hi Danelle Curiale,  I'm okay with her trying Rosuvastatin 5 mg tablet.she had an appointment with me to discussed LFT's but cancelled. Will have our staff reach out to her to reschedule appointment.will discuss GI referral with her. Thank You  Dinah Ngetich,FNP-C   ----- Message ----- From: Leeroy Bock, RPH-CPP Sent: 12/19/2022  11:46 AM EST To: Sandrea Hughs, NP Subject: statin                                         Hi Dinah,  I recently saw Ms Goya for cholesterol management at [facility] and wanted to see if you're ok with a statin rechallenge with rosuvastatin '5mg'$  daily. I saw where her LFTs had increased previously and that she was off of atorvastatin during the most recent labs when they increased slightly. I wasn't sure if you were planning on referring her to GI.  Thanks, [employer]

## 2022-12-19 NOTE — Telephone Encounter (Signed)
Discussed with PCP - they are ok with pt retrying low dose of rosuvastatin '5mg'$  daily. Rx sent to pharmacy. Called pt and left message. Will need to keep a close eye on labs, will plan to recheck lipids and LFTs in 4 weeks when pt returns call.

## 2022-12-19 NOTE — Telephone Encounter (Signed)
-----   Message from Sandrea Hughs, NP sent at 12/19/2022 12:23 PM EST ----- Regarding: RE: statin Hi Lamonica Trueba,  I'm okay with her trying Rosuvastatin 5 mg tablet.she had an appointment with me to discussed LFT's but cancelled. Will have our staff reach out to her to reschedule appointment.will discuss GI referral with her. Thank You  Dinah Ngetich,FNP-C   ----- Message ----- From: Leeroy Bock, RPH-CPP Sent: 12/19/2022  11:46 AM EST To: Sandrea Hughs, NP Subject: statin                                         Hi Dinah,  I recently saw Ms Sarduy for cholesterol management at [facility] and wanted to see if you're ok with a statin rechallenge with rosuvastatin '5mg'$  daily. I saw where her LFTs had increased previously and that she was off of atorvastatin during the most recent labs when they increased slightly. I wasn't sure if you were planning on referring her to GI.  Thanks, [employer]

## 2022-12-19 NOTE — Telephone Encounter (Signed)
This encounter was created in error - please disregard.

## 2022-12-21 NOTE — Telephone Encounter (Signed)
2nd message left for pt.

## 2022-12-21 NOTE — Telephone Encounter (Signed)
Pt returned call. 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 Advised that lab is open from 7:30am-5pm but pt states she cannot make it in before work, after work, or on her lunch break. Scheduled labs on 4/29.

## 2022-12-26 ENCOUNTER — Other Ambulatory Visit: Payer: Self-pay | Admitting: Family

## 2023-01-03 ENCOUNTER — Encounter: Payer: Self-pay | Admitting: Family

## 2023-01-03 ENCOUNTER — Telehealth (INDEPENDENT_AMBULATORY_CARE_PROVIDER_SITE_OTHER): Payer: 59 | Admitting: Family

## 2023-01-03 DIAGNOSIS — Z1211 Encounter for screening for malignant neoplasm of colon: Secondary | ICD-10-CM

## 2023-01-03 DIAGNOSIS — Z794 Long term (current) use of insulin: Secondary | ICD-10-CM | POA: Diagnosis not present

## 2023-01-03 DIAGNOSIS — E1165 Type 2 diabetes mellitus with hyperglycemia: Secondary | ICD-10-CM | POA: Diagnosis not present

## 2023-01-03 DIAGNOSIS — Z7984 Long term (current) use of oral hypoglycemic drugs: Secondary | ICD-10-CM | POA: Diagnosis not present

## 2023-01-03 DIAGNOSIS — Z7985 Long-term (current) use of injectable non-insulin antidiabetic drugs: Secondary | ICD-10-CM

## 2023-01-03 MED ORDER — TIRZEPATIDE 2.5 MG/0.5ML ~~LOC~~ SOAJ: 2.5000 mg | SUBCUTANEOUS | 0 refills | Status: DC

## 2023-01-03 MED ORDER — LANTUS SOLOSTAR 100 UNIT/ML ~~LOC~~ SOPN: 24.0000 [IU] | PEN_INJECTOR | Freq: Every day | SUBCUTANEOUS | 2 refills | Status: DC

## 2023-01-03 MED ORDER — METFORMIN HCL 500 MG PO TABS: 500.0000 mg | ORAL_TABLET | Freq: Two times a day (BID) | ORAL | 1 refills | Status: DC

## 2023-01-03 NOTE — Progress Notes (Addendum)
Provider: Marlowe Sax FNP-C  Aerielle Stoklosa, Nelda Bucks, NP  Patient Care Team: Nobie Alleyne, Nelda Bucks, NP as PCP - General (Family Medicine) Josue Hector, MD as PCP - Cardiology (Cardiology) Janith Lima, MD  Extended Emergency Contact Information Primary Emergency Contact: [facility] Address: [address]          [place], [place] [address] Johnnette Litter of [place] Phone: [phone] Mobile Phone: [phone] Relation: Mother  Code Status:  Full Code  Goals of care: Advanced Directive information    12/17/2022    1:34 PM  Advanced Directives  Does Patient Have a Medical Advance Directive? No  Would patient like information on creating a medical advance directive? No - Patient declined     Chief Complaint  Patient presents with   Diabetes    Needs DM eye exam, foot exam. Spoke with pharmacy and she would ike to try mounjaro instead of lantus. She is need of pap, colonoscopy    HPI:  Pt is a 52 y.o. female seen today for an acute visit to discuss blood sugars.   CBG were high when taking Prednisone but lately has been running low especially at night/mornings.  CBG 50's. Overall CBG runs in the 130's -180's  Due for annual DM eye exam and foot exam.  She would like Lantus discontinued and switch to mounjaro.   Due for pap smear.will schedule appointment.   Also due for colonoscopy.Cologuard verse colonoscopy discussed prefers colonoscopy.   She was previously referred to General surgery for right upper quadrant pain and cholecystitis but states specialist office ha not called her to schedule appointment.on chart review,referral Coordinator noted request was send to [place] [facility] on [address]  telephone: [phone]. Number given to patient to call and schedule appointment.will let provider know if another referral is required.   Past Medical History:  Diagnosis Date   Acid reflux    Anemia    Anxiety    Diabetes mellitus ([place])    Hx of cardiovascular  stress test    ETT-echo 3/16: Normal   Hyperlipidemia    Hypertension    Inflammatory bowel diseases (IBD)    Thyroid disease    Vertigo    Past Surgical History:  Procedure Laterality Date   BILATERAL SALPINGECTOMY Left 01/20/2015   Procedure: LAPAROSCOPIC LEFT SALPINGECTOMY;  Surgeon: Everlene Farrier, MD;  Location: [facility];  Service: Gynecology;  Laterality: Left;   CESAREAN SECTION  2006   LAPAROSCOPIC ASSISTED VAGINAL HYSTERECTOMY N/A 01/20/2015   Procedure: LAPAROSCOPIC ASSISTED VAGINAL HYSTERECTOMY;  Surgeon: Everlene Farrier, MD;  Location: [facility];  Service: Gynecology;  Laterality: N/A;   MYOMECTOMY     2002    Allergies  Allergen Reactions   Clarithromycin Nausea And Vomiting    Outpatient Encounter Medications as of 01/03/2023  Medication Sig   ALPRAZolam (XANAX) 0.25 MG tablet Take 0.25 mg by mouth at bedtime as needed.   aspirin EC 81 MG tablet Take 81 mg by mouth daily. Swallow whole.   Cholecalciferol 1.25 MG (50000 UT) capsule Take 1 capsule (50,000 Units total) by mouth once a week.   escitalopram (LEXAPRO) 10 MG tablet Take 1 tablet (10 mg total) by mouth daily. Needs Appointment before anymore future refills.   fluticasone (FLONASE) 50 MCG/ACT nasal spray Place 1 spray into both nostrils as needed for allergies or rhinitis.   hydrochlorothiazide (HYDRODIURIL) 12.5 MG tablet Take 1 tablet (12.5 mg total) by mouth daily.   insulin glargine (LANTUS SOLOSTAR) 100 UNIT/ML Solostar  Pen Inject 26 Units into the skin at bedtime.   Insulin Pen Needle (PEN NEEDLES) 32G X 4 MM MISC 1 Piece by Does not apply route in the morning, at noon, in the evening, and at bedtime.   lisinopril (ZESTRIL) 5 MG tablet Take 1 tablet by mouth once daily   loratadine (CLARITIN) 10 MG tablet Take 1 tablet (10 mg total) by mouth daily.   metFORMIN (GLUCOPHAGE) 500 MG tablet TAKE 2 TABLETS BY MOUTH TWICE DAILY WITH MEALS .   metoprolol tartrate (LOPRESSOR) 100 MG tablet Take 1 tablet (100 mg total)  by mouth 2 (two) times daily.   Polyethyl Glycol-Propyl Glycol (SYSTANE) 0.4-0.3 % SOLN Apply 1 drop to eye as needed.   rosuvastatin (CRESTOR) 5 MG tablet Take 1 tablet (5 mg total) by mouth daily.   No facility-administered encounter medications on file as of 01/03/2023.    Review of Systems  Constitutional:  Negative for appetite change, chills, fatigue, fever and unexpected weight change.  Eyes:  Negative for pain, discharge, redness, itching and visual disturbance.  Respiratory:  Negative for cough, chest tightness, shortness of breath and wheezing.   Cardiovascular:  Negative for chest pain, palpitations and leg swelling.  Gastrointestinal:  Negative for abdominal distention, abdominal pain, blood in stool, constipation, diarrhea, nausea and vomiting.  Endocrine: Negative for cold intolerance, heat intolerance, polydipsia, polyphagia and polyuria.  Musculoskeletal:  Negative for arthralgias, back pain, gait problem, joint swelling, myalgias, neck pain and neck stiffness.  Skin:  Negative for color change, pallor and rash.  Neurological:  Negative for dizziness, syncope, weakness, light-headedness and headaches.    Immunization History  Administered Date(s) Administered   Fluad Quad(high Dose 65+) 08/22/2022   Hepatitis B 08/26/2021   Influenza, Seasonal, Injecte, Preservative Fre 11/23/2014, 07/10/2015   Influenza,inj,Quad PF,6+ Mos 11/23/2014, 07/10/2015, 07/18/2021   Influenza-Unspecified 11/23/2014, 07/10/2015, 08/03/2016   PFIZER(Purple Top)SARS-COV-2 Vaccination 12/29/2019, 01/19/2020, 02/09/2020, 08/08/2020, 03/05/2021   PNEUMOCOCCAL CONJUGATE-20 07/18/2021   Pfizer Covid-19 Vaccine Bivalent Booster 73yrs & up 07/18/2021   Pneumococcal Polysaccharide-23 08/16/2016   Td 10/16/1999   Td (Adult), 2 Lf Tetanus Toxid, Preservative Free 10/16/1999   Td (Adult),5 Lf Tetanus Toxid, Preservative Free 10/16/1999   Tdap 04/03/2013, 08/26/2021   Zoster Recombinat (Shingrix)  03/05/2021, 07/18/2021   Pertinent  Health Maintenance Due  Topic Date Due   OPHTHALMOLOGY EXAM  Never done   COLONOSCOPY (Pts 45-47yrs Insurance coverage will need to be confirmed)  Never done   PAP SMEAR-Modifier  12/07/2017   FOOT EXAM  11/06/2022   HEMOGLOBIN A1C  06/05/2023   MAMMOGRAM  05/22/2024   INFLUENZA VACCINE  Completed      06/11/2022    9:56 AM 07/11/2022    9:17 AM 08/22/2022   12:34 PM 09/13/2022    1:01 PM 12/17/2022    1:34 PM  [place] in the past year? 0 0 0 0 0  Was there an injury with Fall? 0 0 0 0 0  Fall Risk Category Calculator 0 0 0 0 0  Fall Risk Category (Retired) Low Low Low Low   (RETIRED) Patient Fall Risk Level Low fall risk Low fall risk Low fall risk Low fall risk   Patient at Risk for Falls Due to No Fall Risks No Fall Risks No Fall Risks No Fall Risks   Fall risk Follow up Falls evaluation completed Falls evaluation completed Falls evaluation completed Falls evaluation completed    Functional Status Survey:    There were no vitals  filed for this visit. There is no height or weight on file to calculate BMI. Physical Exam Constitutional:      General: She is not in acute distress.    Appearance: She is not ill-appearing.  Pulmonary:     Effort: Pulmonary effort is normal. No respiratory distress.  Neurological:     Mental Status: She is alert and oriented to person, place, and time.     Gait: Gait normal.  Psychiatric:        Mood and Affect: Mood normal.        Behavior: Behavior normal.     Labs reviewed: Recent Labs    06/08/22 1013 06/22/22 1417 12/05/22 0822  NA 137 139 139  K 4.3 4.3 4.0  CL 101 104 100  CO2 20 23 28   GLUCOSE 156* 220* 105*  BUN 11 9 7   CREATININE 0.89 0.84 0.75  CALCIUM 9.7 9.9 9.7   Recent Labs    06/22/22 1417 07/11/22 0945 12/05/22 0822  AST 53* 35 40*  ALT 46* 25 40*  BILITOT 0.3 0.4 0.3  PROT 7.4 7.7 7.5   Recent Labs    06/08/22 1013 12/05/22 0822  WBC 10.6 10.7   NEUTROABS 6,890 7,137  HGB 12.8 12.1  HCT 37.8 36.1  MCV 80.3 80.6  PLT 371 408*   Lab Results  Component Value Date   TSH 3.80 12/05/2022   Lab Results  Component Value Date   HGBA1C 7.5 (H) 12/05/2022   Lab Results  Component Value Date   CHOL 205 (H) 12/05/2022   HDL 40 (L) 12/05/2022   LDLCALC 140 (H) 12/05/2022   TRIG 124 12/05/2022   CHOLHDL 5.1 (H) 12/05/2022    Significant Diagnostic Results in last 30 days:  No results found.  Assessment/Plan 1. Type 2 diabetes mellitus with hyperglycemia, without long-term current use of insulin (HCC) Lab Results  Component Value Date   HGBA1C 7.5 (H) 12/05/2022  Has had some low blood sugar readings at night and morning. - advised to reduce metformin from 2 tablet twice daily to one tablet twice daily. Also reduce Lantus from 26 units to 24 units daily.will discontinue lantus once she starts on Mounjaro.  - metFORMIN (GLUCOPHAGE) 500 MG tablet; Take 1 tablet (500 mg total) by mouth 2 (two) times daily with a meal.  Dispense: 360 tablet; Refill: 1 - insulin glargine (LANTUS SOLOSTAR) 100 UNIT/ML Solostar Pen; Inject 24 Units into the skin at bedtime.  Dispense: 15 mL; Refill: 2 - tirzepatide (MOUNJARO) 2.5 MG/0.5ML Pen; Inject 2.5 mg into the skin once a week.  Dispense: 2 mL; Refill: 0 - follow up in one month then adjust Mounjaro if tolerated.   2. Colon cancer screening Cologuard verse colonoscopy discussed prefers colonoscopy. Made aware specialist office will call to schedule appointment.  - Ambulatory referral to Gastroenterology  Family/ staff Communication: Reviewed plan of care with patient verbalized understanding.   Labs/tests ordered: None   Next Appointment: Return in about 1 month (around 02/03/2023) for Blood sugars .  I connected with  Richardson Chiquito on 01/03/23 by a video enabled telemedicine application and verified that I am speaking with the correct person using two identifiers.   I discussed the  limitations of evaluation and management by telemedicine. The patient expressed understanding and agreed to proceed.  Spent 13 minutes of face to face on video with patient  >50% time spent counseling; reviewing medical record; tests; labs; and developing future plan of care.  This service  is provided via telemedicine  No vital signs collected/recorded due to the encounter was a telemedicine visit.   Location of patient (ex: home, work):  Home   Patient consents to a telephone visit:  yes  Location of the provider (ex: office, home):  [facility] office   Name of any referring provider: Richarda Blade ,FNP- C  I connected with Havanna  K. Nestor on 01/03/2023 by a video enabled telemedicine application and verified that I am speaking with the correct person using two identifiers.   I discussed the limitations of evaluation and management by telemedicine. The patient expressed understanding and agreed to proceed.    Caesar Bookman, NP

## 2023-02-01 ENCOUNTER — Other Ambulatory Visit: Payer: Self-pay | Admitting: Family

## 2023-02-01 DIAGNOSIS — E1165 Type 2 diabetes mellitus with hyperglycemia: Secondary | ICD-10-CM

## 2023-02-08 ENCOUNTER — Ambulatory Visit: Payer: 59 | Admitting: Family

## 2023-02-11 ENCOUNTER — Ambulatory Visit: Payer: 59 | Attending: Cardiovascular Disease

## 2023-02-11 DIAGNOSIS — E782 Mixed hyperlipidemia: Secondary | ICD-10-CM

## 2023-02-12 ENCOUNTER — Ambulatory Visit: Payer: 59 | Admitting: Family

## 2023-02-12 LAB — HEPATIC FUNCTION PANEL
ALT: 14 IU/L (ref 0–32)
AST: 20 IU/L (ref 0–40)
Albumin: 4.5 g/dL (ref 3.8–4.9)
Alkaline Phosphatase: 124 IU/L — ABNORMAL HIGH (ref 44–121)
Bilirubin Total: 0.3 mg/dL (ref 0.0–1.2)
Bilirubin, Direct: <0.1 mg/dL (ref 0.00–0.40)
Total Protein: 7.1 g/dL (ref 6.0–8.5)

## 2023-02-12 LAB — LIPID PANEL
Chol/HDL Ratio: 4.9 ratio — ABNORMAL HIGH (ref 0.0–4.4)
Cholesterol, Total: 153 mg/dL (ref 100–199)
HDL: 31 mg/dL — ABNORMAL LOW (ref >39)
LDL Chol Calc (NIH): 106 mg/dL — ABNORMAL HIGH (ref 0–99)
Triglycerides: 85 mg/dL (ref 0–149)
VLDL Cholesterol Cal: 16 mg/dL (ref 5–40)

## 2023-02-13 ENCOUNTER — Telehealth: Payer: Self-pay | Admitting: Pharmacist

## 2023-02-13 ENCOUNTER — Ambulatory Visit: Payer: Self-pay | Admitting: General Surgery

## 2023-02-13 NOTE — Telephone Encounter (Addendum)
LFTs previously elevated both on and off statin. Currently WNL on rosuvastatin 5mg  daily. LDL 106, down from 140 on no lipid lowering meds. Could target LDL goal < 100 given CAC of 0, however abdominal CT did show aortic atherosclerosis so would be reasonable to target < 70. Called pt and left message to discuss. To target more lenient goal < 100, will plan to increase rosuvastatin to 10mg  daily and recheck lipids/LFTs in 1-2 months if pt agreeable.

## 2023-02-15 NOTE — Telephone Encounter (Signed)
Called pt again, 2nd message left.

## 2023-02-18 NOTE — Telephone Encounter (Signed)
Called pt again. She is tolerating rosuvastatin well. She is having her gallbladder removed soon and will not be able to eat greasy food etc. She prefers to continue on same dose of her rosuvastatin. Her LDL is very close to her goal, anticipate with major dietary changes coming up that it will be controlled without med change. She wonders if her gallbladder led to her LFT issues and will follow up with her GI doctor. She reports she sees her PCP in another few months and will have them check her cholesterol again.

## 2023-03-03 ENCOUNTER — Other Ambulatory Visit: Payer: Self-pay | Admitting: Family

## 2023-03-03 DIAGNOSIS — E1165 Type 2 diabetes mellitus with hyperglycemia: Secondary | ICD-10-CM

## 2023-03-12 NOTE — Progress Notes (Signed)
Surgical Instructions    Your procedure is scheduled on Monday June 3rd.  Report to [facility] Main Entrance "A" at 6:30 A.M., then check in with the Admitting office.  Call this number if you have problems the morning of surgery:  [phone]   If you have any questions prior to your surgery date call [phone]: Open Monday-Friday 8am-4pm If you experience any cold or flu symptoms such as cough, fever, chills, shortness of breath, etc. between now and your scheduled surgery, please notify us at the above number     Remember:  Do not eat after midnight the night before your surgery  You may drink clear liquids until 5:30am the morning of your surgery.   Clear liquids allowed are: Water, Non-Citrus Juices (without pulp), Carbonated Beverages, Clear Tea, Black Coffee ONLY (NO MILK, CREAM OR POWDERED CREAMER of any kind), and Gatorade    Take these medicines the morning of surgery with A SIP OF WATER: escitalopram (LEXAPRO) 10 MG tablet  loratadine (CLARITIN) 10 MG tablet  metoprolol tartrate (LOPRESSOR) 100 MG tablet  Polyethyl Glycol-Propyl Glycol (SYSTANE) 0.4-0.3 % SOLN  rosuvastatin (CRESTOR) 5 MG tablet   IF NEEDED  fluticasone (FLONASE) 50 MCG/ACT nasal spray  Olopatadine HCl (PATADAY) 0.7 % SOLN    As of today, STOP taking any Aspirin (unless otherwise instructed by your surgeon) Aleve, Naproxen, Ibuprofen, Motrin, Advil, Goody's, BC's, all herbal medications, fish oil, and all vitamins.   WHAT DO I DO ABOUT MY DIABETES MEDICATION?   Do not take oral diabetes medicines (Metformin) the morning of surgery.  DO NOT TAKE your MOUNJARO 7 days prior to your procedure  HOW TO MANAGE YOUR DIABETES BEFORE AND AFTER SURGERY  Why is it important to control my blood sugar before and after surgery? Improving blood sugar levels before and after surgery helps healing and can limit problems. A way of improving blood sugar control is eating a healthy diet by:  Eating less sugar  and carbohydrates  Increasing activity/exercise  Talking with your doctor about reaching your blood sugar goals High blood sugars (greater than 180 mg/dL) can raise your risk of infections and slow your recovery, so you will need to focus on controlling your diabetes during the weeks before surgery. Make sure that the doctor who takes care of your diabetes knows about your planned surgery including the date and location.  How do I manage my blood sugar before surgery? Check your blood sugar at least 4 times a day, starting 2 days before surgery, to make sure that the level is not too high or low.  Check your blood sugar the morning of your surgery when you wake up and every 2 hours until you get to the Short Stay unit.  If your blood sugar is less than 70 mg/dL, you will need to treat for low blood sugar: Do not take insulin. Treat a low blood sugar (less than 70 mg/dL) with  cup of clear juice (cranberry or apple), 4 glucose tablets, OR glucose gel. Recheck blood sugar in 15 minutes after treatment (to make sure it is greater than 70 mg/dL). If your blood sugar is not greater than 70 mg/dL on recheck, call [phone] for further instructions. Report your blood sugar to the short stay nurse when you get to Short Stay.  If you are admitted to the hospital after surgery: Your blood sugar will be checked by the staff and you will probably be given insulin after surgery (instead of oral diabetes medicines) to make  sure you have good blood sugar levels. The goal for blood sugar control after surgery is 80-180 mg/dL.    SURGICAL INSTRUCTIONS        Do not wear jewelry or makeup. Do not wear lotions, powders, perfumes or deodorant. Do not shave 48 hours prior to surgery.   Do not bring valuables to the hospital. Do not wear nail polish, gel polish, artificial nails, or any other type of covering on natural nails (fingers and toes) If you have artificial nails or gel coating that need to be  removed by a nail salon, please have this removed prior to surgery. Artificial nails or gel coating may interfere with anesthesia's ability to adequately monitor your vital signs.  [facility] is not responsible for any belongings or valuables.    Do NOT Smoke (Tobacco/Vaping)  24 hours prior to your procedure  If you use a CPAP at night, you may bring your mask for your overnight stay.   Contacts, glasses, hearing aids, dentures or partials may not be worn into surgery, please bring cases for these belongings   For patients admitted to the hospital, discharge time will be determined by your treatment team.   Patients discharged the day of surgery will not be allowed to drive home, and someone needs to stay with them for 24 hours.   SURGICAL WAITING ROOM VISITATION Patients having surgery or a procedure may have no more than 2 support people in the waiting area - these visitors may rotate.   Children under the age of 3 must have an adult with them who is not the patient. If the patient needs to stay at the hospital during part of their recovery, the visitor guidelines for inpatient rooms apply. Pre-op nurse will coordinate an appropriate time for 1 support person to accompany patient in pre-op.  This support person may not rotate.   Please refer to [email or web address] for the visitor guidelines for Inpatients (after your surgery is over and you are in a regular room).    Special instructions:    Oral Hygiene is also important to reduce your risk of infection.  Remember - BRUSH YOUR TEETH THE MORNING OF SURGERY WITH YOUR REGULAR TOOTHPASTE   [facility]- Preparing For Surgery  Before surgery, you can play an important role. Because skin is not sterile, your skin needs to be as free of germs as possible. You can reduce the number of germs on your skin by washing with CHG (chlorahexidine gluconate) Soap before surgery.  CHG is an  antiseptic cleaner which kills germs and bonds with the skin to continue killing germs even after washing.     Please do not use if you have an allergy to CHG or antibacterial soaps. If your skin becomes reddened/irritated stop using the CHG.  Do not shave (including legs and underarms) for at least 48 hours prior to first CHG shower. It is OK to shave your face.  Please follow these instructions carefully.     Shower the NIGHT BEFORE SURGERY and the MORNING OF SURGERY with CHG Soap.   If you chose to wash your hair, wash your hair first as usual with your normal shampoo. After you shampoo, rinse your hair and body thoroughly to remove the shampoo.  Then [employer] and genitals (private parts) with your normal soap and rinse thoroughly to remove soap.  After that Use CHG Soap as you would any other liquid soap. You can apply CHG directly to the skin and wash gently with  a scrungie or a clean washcloth.   Apply the CHG Soap to your body ONLY FROM THE NECK DOWN.  Do not use on open wounds or open sores. Avoid contact with your eyes, ears, mouth and genitals (private parts). Wash Face and genitals (private parts)  with your normal soap.   Wash thoroughly, paying special attention to the area where your surgery will be performed.  Thoroughly rinse your body with warm water from the neck down.  DO NOT shower/wash with your normal soap after using and rinsing off the CHG Soap.  Pat yourself dry with a CLEAN TOWEL.  Wear CLEAN PAJAMAS to bed the night before surgery  Place CLEAN SHEETS on your bed the night before your surgery  DO NOT SLEEP WITH PETS.   Day of Surgery:  Take a shower with CHG soap. Wear Clean/Comfortable clothing the morning of surgery Do not apply any deodorants/lotions.   Remember to brush your teeth WITH YOUR REGULAR TOOTHPASTE.    If you received a COVID test during your pre-op visit, it is requested that you wear a mask when out in public, stay away from anyone  that may not be feeling well, and notify your surgeon if you develop symptoms. If you have been in contact with anyone that has tested positive in the last 10 days, please notify your surgeon.    Please read over the following fact sheets that you were given.

## 2023-03-13 ENCOUNTER — Other Ambulatory Visit (HOSPITAL_COMMUNITY): Payer: 59

## 2023-03-13 ENCOUNTER — Encounter (HOSPITAL_COMMUNITY)
Admission: RE | Admit: 2023-03-13 | Discharge: 2023-03-13 | Disposition: A | Discharge status: Home / Self Care | Payer: 59 | Source: Ambulatory Visit | Attending: General Surgery | Admitting: General Surgery

## 2023-03-13 ENCOUNTER — Other Ambulatory Visit: Payer: Self-pay

## 2023-03-13 ENCOUNTER — Encounter (HOSPITAL_COMMUNITY): Payer: Self-pay

## 2023-03-13 VITALS — BP 129/95 | HR 86 | Temp 98.0°F | Resp 18 | Ht 66.0 in | Wt 178.7 lb

## 2023-03-13 DIAGNOSIS — K8020 Calculus of gallbladder without cholecystitis without obstruction: Secondary | ICD-10-CM | POA: Insufficient documentation

## 2023-03-13 DIAGNOSIS — Z01812 Encounter for preprocedural laboratory examination: Secondary | ICD-10-CM | POA: Insufficient documentation

## 2023-03-13 DIAGNOSIS — E079 Disorder of thyroid, unspecified: Secondary | ICD-10-CM | POA: Insufficient documentation

## 2023-03-13 DIAGNOSIS — I700 Atherosclerosis of aorta: Secondary | ICD-10-CM | POA: Diagnosis not present

## 2023-03-13 DIAGNOSIS — Z794 Long term (current) use of insulin: Secondary | ICD-10-CM | POA: Diagnosis not present

## 2023-03-13 DIAGNOSIS — E1165 Type 2 diabetes mellitus with hyperglycemia: Secondary | ICD-10-CM | POA: Diagnosis not present

## 2023-03-13 HISTORY — DX: Other complications of anesthesia, initial encounter: T88.59XA

## 2023-03-13 LAB — CBC
HCT: 43.1 % (ref 36.0–46.0)
Hemoglobin: 13.6 g/dL (ref 12.0–15.0)
MCH: 26.2 pg (ref 26.0–34.0)
MCHC: 31.6 g/dL (ref 30.0–36.0)
MCV: 82.9 fL (ref 80.0–100.0)
Platelets: 363 10*3/uL (ref 150–400)
RBC: 5.2 MIL/uL — ABNORMAL HIGH (ref 3.87–5.11)
RDW: 13.2 % (ref 11.5–15.5)
WBC: 9.3 10*3/uL (ref 4.0–10.5)
nRBC: 0 % (ref 0.0–0.2)

## 2023-03-13 LAB — BASIC METABOLIC PANEL
Anion gap: 10 (ref 5–15)
BUN: 11 mg/dL (ref 6–20)
CO2: 24 mmol/L (ref 22–32)
Calcium: 9.8 mg/dL (ref 8.9–10.3)
Chloride: 104 mmol/L (ref 98–111)
Creatinine, Ser: 0.87 mg/dL (ref 0.44–1.00)
GFR, Estimated: >60 mL/min (ref >60)
Glucose, Bld: 131 mg/dL — ABNORMAL HIGH (ref 70–99)
Potassium: 4.3 mmol/L (ref 3.5–5.1)
Sodium: 138 mmol/L (ref 135–145)

## 2023-03-13 LAB — GLUCOSE, CAPILLARY: Glucose-Capillary: 90 mg/dL (ref 70–99)

## 2023-03-13 NOTE — Progress Notes (Signed)
PCP - Etter Sjogren NP [facility] Cardiologist - Saw Dr. Charlton Haws in December 2023  PPM/ICD - Denies  Chest x-ray - N/A EKG - N/A Stress Test - 12/29/14 ECHO - 11/14/22 Cardiac Cath - Denies  Sleep Study - Denies  DM - Type II CBG at PAT appt 90 Patient's Libre read 124  Fasting Blood Sugar - 80-90 Checks Blood Sugar _multiple____ times a day  Last dose of GLP1 agonist-  Mounjaro, last dose 22nd GLP1 instructions: Patient holding todays dose for her surgery  Blood Thinner Instructions:N/A Aspirin Instructions:N/A  ERAS Protcol - Yes  COVID TEST- N/I  Anesthesia review: Yes has seen cardiology recently   Patient denies shortness of breath, fever, cough and chest pain at PAT appointment   All instructions explained to the patient, with a verbal understanding of the material. Patient agrees to go over the instructions while at home for a better understanding. The opportunity to ask questions was provided.

## 2023-03-14 ENCOUNTER — Encounter (HOSPITAL_COMMUNITY): Payer: Self-pay

## 2023-03-14 LAB — HEMOGLOBIN A1C
Hgb A1c MFr Bld: 6.6 % — ABNORMAL HIGH (ref 4.8–5.6)
Mean Plasma Glucose: 143 mg/dL

## 2023-03-14 NOTE — Anesthesia Preprocedure Evaluation (Addendum)
Anesthesia Evaluation  Patient identified by MRN, date of birth, ID band Patient awake    Reviewed: Allergy & Precautions, NPO status , Patient's Chart, lab work & pertinent test results, reviewed documented beta blocker date and time   History of Anesthesia Complications Negative for: history of anesthetic complications  Airway Mallampati: II  TM Distance: >3 FB Neck ROM: Full    Dental  (+) Edentulous Lower, Edentulous Upper   Pulmonary neg pulmonary ROS   Pulmonary exam normal        Cardiovascular hypertension, Pt. on medications and Pt. on home beta blockers Normal cardiovascular exam  Echo 10/04/22: EF 50-55%, moderate asymmetric LVH of basal-septal segment, mild RV dysfunction, mild MR     Neuro/Psych   Anxiety        GI/Hepatic Neg liver ROS,GERD  ,,gallstones   Endo/Other  diabetes (on Mounjaro), Type 2, Oral Hypoglycemic AgentsHypothyroidism    Renal/GU negative Renal ROS     Musculoskeletal negative musculoskeletal ROS (+)    Abdominal   Peds  Hematology negative hematology ROS (+)   Anesthesia Other Findings Day of surgery medications reviewed with patient.  Reproductive/Obstetrics negative OB ROS                             Anesthesia Physical Anesthesia Plan  ASA: 2  Anesthesia Plan: General   Post-op Pain Management: Tylenol PO (pre-op)*   Induction:   PONV Risk Score and Plan: 4 or greater and Ondansetron, Dexamethasone, Treatment may vary due to age or medical condition, Midazolam and Scopolamine patch - Pre-op  Airway Management Planned: Oral ETT  Additional Equipment: None  Intra-op Plan:   Post-operative Plan: Extubation in OR  Informed Consent: I have reviewed the patients History and Physical, chart, labs and discussed the procedure including the risks, benefits and alternatives for the proposed anesthesia with the patient or authorized representative  who has indicated his/her understanding and acceptance.     Dental advisory given  Plan Discussed with: CRNA  Anesthesia Plan Comments: (See PAT note from 5/29 by Sherlie Ban PA-C )        Anesthesia Quick Evaluation

## 2023-03-14 NOTE — Progress Notes (Signed)
Case: [id number] Date/Time: 03/18/23 0815   Procedure: LAPAROSCOPIC CHOLECYSTECTOMY WITH INTRAOPERATIVE CHOLANGIOGRAM   Anesthesia type: General   Pre-op diagnosis: GALLSTONES   [facility] OR ROOM 02 / [facility] OR   Surgeons: Griselda Miner, MD       DISCUSSION: Janet Marquez is a 52 year old female who presents to PAT prior to lap chole.  Patient has been having intermittent right upper quadrant pain and nausea since the fall of last year.  Now having her gallbladder removed due to ongoing biliary colic.  During her workup for her gallbladder she had a CT of her abdomen which was remarkable for atherosclerosis of the aorta and she was referred to cardiology.  She had an office visit with them on 10/01/22 and it was recommended to get a CT calcium score and echo both of which were normal.  Per Dr. Eden Emms during that visit "Clear to have general anesthesia and operation if deemed necessary"  Other chronic medical problems includes diabetes, hypertension, thyroid disease and follows with her PCP for this.  These have been controlled.  No significant complications from anesthesia. Apparently she has experience itching intermittently after an operation to remove lipomas.  VS: BP (!) 129/95   Pulse 86   Temp 36.7 C   Resp 18   Ht 5\' 6"  (1.676 m)   Wt 81.1 kg   LMP 11/21/2014   SpO2 100%   BMI 28.84 kg/m   PROVIDERS: Ngetich, Dinah C, NP   LABS: Labs reviewed: Acceptable for surgery. (all labs ordered are listed, but only abnormal results are displayed)  Labs Reviewed  HEMOGLOBIN A1C - Abnormal; Notable for the following components:      Result Value   Hgb A1c MFr Bld 6.6 (*)    All other components within normal limits  BASIC METABOLIC PANEL - Abnormal; Notable for the following components:   Glucose, Bld 131 (*)    All other components within normal limits  CBC - Abnormal; Notable for the following components:   RBC 5.20 (*)    All other components within normal limits   GLUCOSE, CAPILLARY     IMAGES:  CT A/P wo contrast 09/07/22:  IMPRESSION: 1. Mild bowel wall thickening of the rectum without significant surrounding inflammatory changes. Findings are nonspecific and may be infectious/inflammatory in etiology. 2. Cholelithiasis without evidence of acute cholecystitis.   Aortic Atherosclerosis (ICD10-I70.0).  CT Cardiac scoring 11/13/22:  FINDINGS: Coronary arteries: Normal origins.   Coronary Calcium Score:   Total: 0   Pericardium: Normal.   Ascending Aorta: Normal caliber.   Non-cardiac: See separate report from [facility].   IMPRESSION: Coronary calcium score of 0.   EKG 10/01/22:  Sinus tachycardia, rate 108   CV:  Echo 10/04/22:  IMPRESSIONS    1. Left ventricular ejection fraction, by estimation, is 50 to 55%. The  left ventricle has low normal function. The left ventricle has no regional  wall motion abnormalities. There is moderate asymmetric left ventricular  hypertrophy of the basal-septal  segment. Left ventricular diastolic parameters were normal.   2. Right ventricular systolic function is mildly reduced. The right  ventricular size is normal. There is normal pulmonary artery systolic  pressure.   3. The mitral valve is normal in structure. Mild mitral valve  regurgitation.   4. The aortic valve is tricuspid. Aortic valve regurgitation is not  visualized. No aortic stenosis is present.   5. The inferior vena cava is normal in size with greater than 50%  respiratory  variability, suggesting right atrial pressure of 3 mmHg.   Per Dr. Eden Emms: "Low normal RV/LV function only mild MR overall ok f/u echo in a year will consider f/u stress test pending calcium score "  Past Medical History:  Diagnosis Date   Acid reflux    Anemia    Anxiety    Complication of anesthesia    patient had lipomas in 2019 removed and has experienced itching since then intermittently   Diabetes mellitus (HCC)    type  II   Hx of cardiovascular stress test    ETT-echo 3/16: Normal   Hyperlipidemia    Hypertension    Inflammatory bowel diseases (IBD)    Thyroid disease    Vertigo     Past Surgical History:  Procedure Laterality Date   BILATERAL SALPINGECTOMY Left 01/20/2015   Procedure: LAPAROSCOPIC LEFT SALPINGECTOMY;  Surgeon: Harold Hedge, MD;  Location: [facility] ORS;  Service: Gynecology;  Laterality: Left;   CESAREAN SECTION  2006   LAPAROSCOPIC ASSISTED VAGINAL HYSTERECTOMY N/A 01/20/2015   Procedure: LAPAROSCOPIC ASSISTED VAGINAL HYSTERECTOMY;  Surgeon: Harold Hedge, MD;  Location: [facility] ORS;  Service: Gynecology;  Laterality: N/A;   LIPOMA EXCISION  2019   MYOMECTOMY     2002    MEDICATIONS:  escitalopram (LEXAPRO) 10 MG tablet   fluticasone (FLONASE) 50 MCG/ACT nasal spray   hydrochlorothiazide (HYDRODIURIL) 12.5 MG tablet   insulin glargine (LANTUS SOLOSTAR) 100 UNIT/ML Solostar Pen   Insulin Pen Needle (PEN NEEDLES) 32G X 4 MM MISC   lisinopril (ZESTRIL) 5 MG tablet   loratadine (CLARITIN) 10 MG tablet   metFORMIN (GLUCOPHAGE) 500 MG tablet   metoprolol tartrate (LOPRESSOR) 100 MG tablet   MOUNJARO 2.5 MG/0.5ML Pen   Olopatadine HCl (PATADAY) 0.7 % SOLN   Polyethyl Glycol-Propyl Glycol (SYSTANE) 0.4-0.3 % SOLN   rosuvastatin (CRESTOR) 5 MG tablet   No current facility-administered medications for this encounter.   Marcille Blanco [facility] Surgical Short Stay/Anesthesiology [facility] Phone [phone] 03/14/2023 9:58 AM

## 2023-03-18 ENCOUNTER — Ambulatory Visit (HOSPITAL_BASED_OUTPATIENT_CLINIC_OR_DEPARTMENT_OTHER): Payer: 59 | Admitting: Anesthesiology

## 2023-03-18 ENCOUNTER — Ambulatory Visit (HOSPITAL_COMMUNITY): Payer: 59

## 2023-03-18 ENCOUNTER — Ambulatory Visit (HOSPITAL_COMMUNITY)
Admission: RE | Admit: 2023-03-18 | Discharge: 2023-03-18 | Disposition: A | Discharge status: Home / Self Care | Payer: 59 | Attending: General Surgery | Admitting: General Surgery

## 2023-03-18 ENCOUNTER — Ambulatory Visit (HOSPITAL_COMMUNITY): Payer: 59 | Admitting: Medical

## 2023-03-18 ENCOUNTER — Encounter (HOSPITAL_COMMUNITY)
Admission: RE | Disposition: A | Discharge status: Home / Self Care | Payer: Self-pay | Source: Home / Self Care | Attending: General Surgery

## 2023-03-18 ENCOUNTER — Other Ambulatory Visit: Payer: Self-pay

## 2023-03-18 DIAGNOSIS — K8010 Calculus of gallbladder with chronic cholecystitis without obstruction: Secondary | ICD-10-CM | POA: Insufficient documentation

## 2023-03-18 DIAGNOSIS — E119 Type 2 diabetes mellitus without complications: Secondary | ICD-10-CM | POA: Insufficient documentation

## 2023-03-18 DIAGNOSIS — I10 Essential (primary) hypertension: Secondary | ICD-10-CM | POA: Diagnosis not present

## 2023-03-18 DIAGNOSIS — K8020 Calculus of gallbladder without cholecystitis without obstruction: Secondary | ICD-10-CM

## 2023-03-18 DIAGNOSIS — E039 Hypothyroidism, unspecified: Secondary | ICD-10-CM

## 2023-03-18 DIAGNOSIS — Z7984 Long term (current) use of oral hypoglycemic drugs: Secondary | ICD-10-CM

## 2023-03-18 DIAGNOSIS — E1165 Type 2 diabetes mellitus with hyperglycemia: Secondary | ICD-10-CM

## 2023-03-18 DIAGNOSIS — Z7985 Long-term (current) use of injectable non-insulin antidiabetic drugs: Secondary | ICD-10-CM | POA: Diagnosis not present

## 2023-03-18 HISTORY — PX: CHOLECYSTECTOMY: SHX55

## 2023-03-18 LAB — GLUCOSE, CAPILLARY
Glucose-Capillary: 119 mg/dL — ABNORMAL HIGH (ref 70–99)
Glucose-Capillary: 92 mg/dL (ref 70–99)
Glucose-Capillary: 181 mg/dL — ABNORMAL HIGH (ref 70–99)

## 2023-03-18 SURGERY — LAPAROSCOPIC CHOLECYSTECTOMY WITH INTRAOPERATIVE CHOLANGIOGRAM
Anesthesia: General

## 2023-03-18 MED ORDER — OXYCODONE HCL 5 MG PO TABS: 5.0000 mg | ORAL_TABLET | Freq: Four times a day (QID) | ORAL | 0 refills | Status: DC | PRN

## 2023-03-18 MED ORDER — ONDANSETRON HCL 4 MG/2ML IJ SOLN
INTRAMUSCULAR | Status: AC
  Filled 2023-03-18: qty 2

## 2023-03-18 MED ORDER — CELECOXIB 200 MG PO CAPS
200.0000 mg | ORAL_CAPSULE | ORAL | Status: AC
  Administered 2023-03-18: 200 mg via ORAL
  Filled 2023-03-18: qty 1

## 2023-03-18 MED ORDER — SUGAMMADEX SODIUM 200 MG/2ML IV SOLN
INTRAVENOUS | Status: DC | PRN
  Administered 2023-03-18: 326.4 mg via INTRAVENOUS

## 2023-03-18 MED ORDER — CHLORHEXIDINE GLUCONATE CLOTH 2 % EX PADS: 6.0000 | MEDICATED_PAD | Freq: Once | CUTANEOUS | Status: DC

## 2023-03-18 MED ORDER — AMISULPRIDE (ANTIEMETIC) 5 MG/2ML IV SOLN: 10.0000 mg | Freq: Once | INTRAVENOUS | Status: DC | PRN

## 2023-03-18 MED ORDER — CHLORHEXIDINE GLUCONATE 0.12 % MT SOLN
15.0000 mL | Freq: Once | OROMUCOSAL | Status: AC
  Administered 2023-03-18: 15 mL via OROMUCOSAL
  Filled 2023-03-18: qty 15

## 2023-03-18 MED ORDER — LIDOCAINE 2% (20 MG/ML) 5 ML SYRINGE
INTRAMUSCULAR | Status: AC
  Filled 2023-03-18: qty 5

## 2023-03-18 MED ORDER — LIDOCAINE 2% (20 MG/ML) 5 ML SYRINGE
INTRAMUSCULAR | Status: DC | PRN
  Administered 2023-03-18: 100 mg via INTRAVENOUS

## 2023-03-18 MED ORDER — ONDANSETRON HCL 4 MG/2ML IJ SOLN
INTRAMUSCULAR | Status: DC | PRN
  Administered 2023-03-18: 4 mg via INTRAVENOUS

## 2023-03-18 MED ORDER — FENTANYL CITRATE (PF) 250 MCG/5ML IJ SOLN
INTRAMUSCULAR | Status: DC | PRN
  Administered 2023-03-18 (×3): 50 ug via INTRAVENOUS

## 2023-03-18 MED ORDER — INSULIN ASPART 100 UNIT/ML IJ SOLN: 0.0000 [IU] | INTRAMUSCULAR | Status: DC | PRN

## 2023-03-18 MED ORDER — PHENYLEPHRINE HCL-NACL 20-0.9 MG/250ML-% IV SOLN
INTRAVENOUS | Status: DC | PRN
  Administered 2023-03-18: 80 ug via INTRAVENOUS

## 2023-03-18 MED ORDER — GLYCOPYRROLATE PF 0.2 MG/ML IJ SOSY
PREFILLED_SYRINGE | INTRAMUSCULAR | Status: AC
  Filled 2023-03-18: qty 2

## 2023-03-18 MED ORDER — SCOPOLAMINE 1 MG/3DAYS TD PT72
1.0000 | MEDICATED_PATCH | Freq: Once | TRANSDERMAL | Status: DC
  Administered 2023-03-18: 1.5 mg via TRANSDERMAL
  Filled 2023-03-18: qty 1

## 2023-03-18 MED ORDER — CEFAZOLIN SODIUM-DEXTROSE 2-4 GM/100ML-% IV SOLN
2.0000 g | INTRAVENOUS | Status: AC
  Administered 2023-03-18: 2 g via INTRAVENOUS
  Filled 2023-03-18: qty 100

## 2023-03-18 MED ORDER — ACETAMINOPHEN 500 MG PO TABS: 1000.0000 mg | ORAL_TABLET | Freq: Once | ORAL | Status: DC

## 2023-03-18 MED ORDER — GABAPENTIN 300 MG PO CAPS
300.0000 mg | ORAL_CAPSULE | ORAL | Status: AC
  Administered 2023-03-18: 300 mg via ORAL
  Filled 2023-03-18: qty 1

## 2023-03-18 MED ORDER — SODIUM CHLORIDE 0.9 % IV SOLN
INTRAVENOUS | Status: DC | PRN
  Administered 2023-03-18: 5 mL

## 2023-03-18 MED ORDER — FENTANYL CITRATE (PF) 250 MCG/5ML IJ SOLN
INTRAMUSCULAR | Status: AC
  Filled 2023-03-18: qty 5

## 2023-03-18 MED ORDER — ACETAMINOPHEN 500 MG PO TABS
1000.0000 mg | ORAL_TABLET | ORAL | Status: AC
  Administered 2023-03-18: 1000 mg via ORAL
  Filled 2023-03-18: qty 2

## 2023-03-18 MED ORDER — PROPOFOL 10 MG/ML IV BOLUS
INTRAVENOUS | Status: DC | PRN
  Administered 2023-03-18: 170 mg via INTRAVENOUS

## 2023-03-18 MED ORDER — ROCURONIUM BROMIDE 10 MG/ML (PF) SYRINGE
PREFILLED_SYRINGE | INTRAVENOUS | Status: DC | PRN
  Administered 2023-03-18: 60 mg via INTRAVENOUS

## 2023-03-18 MED ORDER — SODIUM CHLORIDE 0.9 % IR SOLN
Status: DC | PRN
  Administered 2023-03-18: 1000 mL

## 2023-03-18 MED ORDER — OXYCODONE HCL 5 MG PO TABS
ORAL_TABLET | ORAL | Status: AC
  Filled 2023-03-18: qty 1

## 2023-03-18 MED ORDER — DEXAMETHASONE SODIUM PHOSPHATE 10 MG/ML IJ SOLN
INTRAMUSCULAR | Status: DC | PRN
  Administered 2023-03-18: 10 mg via INTRAVENOUS

## 2023-03-18 MED ORDER — OXYCODONE HCL 5 MG/5ML PO SOLN: 5.0000 mg | Freq: Once | ORAL | Status: AC | PRN

## 2023-03-18 MED ORDER — MIDAZOLAM HCL 2 MG/2ML IJ SOLN
INTRAMUSCULAR | Status: AC
  Filled 2023-03-18: qty 2

## 2023-03-18 MED ORDER — LACTATED RINGERS IV SOLN: INTRAVENOUS | Status: DC

## 2023-03-18 MED ORDER — BUPIVACAINE-EPINEPHRINE 0.25% -1:200000 IJ SOLN
INTRAMUSCULAR | Status: DC | PRN
  Administered 2023-03-18: 14 mL

## 2023-03-18 MED ORDER — PROPOFOL 10 MG/ML IV BOLUS
INTRAVENOUS | Status: AC
  Filled 2023-03-18: qty 20

## 2023-03-18 MED ORDER — DEXAMETHASONE SODIUM PHOSPHATE 10 MG/ML IJ SOLN
INTRAMUSCULAR | Status: AC
  Filled 2023-03-18: qty 1

## 2023-03-18 MED ORDER — ORAL CARE MOUTH RINSE: 15.0000 mL | Freq: Once | OROMUCOSAL | Status: AC

## 2023-03-18 MED ORDER — ROCURONIUM BROMIDE 10 MG/ML (PF) SYRINGE
PREFILLED_SYRINGE | INTRAVENOUS | Status: AC
  Filled 2023-03-18: qty 10

## 2023-03-18 MED ORDER — OXYCODONE HCL 5 MG PO TABS
5.0000 mg | ORAL_TABLET | Freq: Once | ORAL | Status: AC | PRN
  Administered 2023-03-18: 5 mg via ORAL

## 2023-03-18 MED ORDER — BUPIVACAINE-EPINEPHRINE (PF) 0.25% -1:200000 IJ SOLN
INTRAMUSCULAR | Status: AC
  Filled 2023-03-18: qty 30

## 2023-03-18 MED ORDER — FENTANYL CITRATE (PF) 100 MCG/2ML IJ SOLN: 25.0000 ug | INTRAMUSCULAR | Status: DC | PRN

## 2023-03-18 SURGICAL SUPPLY — 43 items
ADH SKN CLS APL DERMABOND .7 (GAUZE/BANDAGES/DRESSINGS) ×1
ADH SKN CLS LQ APL DERMABOND (GAUZE/BANDAGES/DRESSINGS) ×1
APL PRP STRL LF DISP 70% ISPRP (MISCELLANEOUS) ×1
APPLIER CLIP 5 13 M/L LIGAMAX5 (MISCELLANEOUS) ×1
APR CLP MED LRG 5 ANG JAW (MISCELLANEOUS) ×1
BAG COUNTER SPONGE SURGICOUNT (BAG) ×1 IMPLANT
BAG SPNG CNTER NS LX DISP (BAG) ×1
BLADE CLIPPER SURG (BLADE) IMPLANT
CANISTER SUCT 3000ML PPV (MISCELLANEOUS) ×1 IMPLANT
CATH REDDICK CHOLANGI 4FR 50CM (CATHETERS) ×1 IMPLANT
CHLORAPREP W/TINT 26 (MISCELLANEOUS) ×1 IMPLANT
CLIP APPLIE 5 13 M/L LIGAMAX5 (MISCELLANEOUS) ×1 IMPLANT
COVER MAYO STAND STRL (DRAPES) ×1 IMPLANT
COVER SURGICAL LIGHT HANDLE (MISCELLANEOUS) ×1 IMPLANT
DERMABOND ADVANCED .7 DNX12 (GAUZE/BANDAGES/DRESSINGS) ×1 IMPLANT
DERMABOND ADVANCED .7 DNX6 (GAUZE/BANDAGES/DRESSINGS) IMPLANT
DRAPE C-ARM 42X120 X-RAY (DRAPES) ×1 IMPLANT
ELECT REM PT RETURN 9FT ADLT (ELECTROSURGICAL) ×1
ELECTRODE REM PT RTRN 9FT ADLT (ELECTROSURGICAL) ×1 IMPLANT
GLOVE BIO SURGEON STRL SZ7.5 (GLOVE) ×1 IMPLANT
GOWN STRL REUS W/ TWL LRG LVL3 (GOWN DISPOSABLE) ×3 IMPLANT
GOWN STRL REUS W/TWL LRG LVL3 (GOWN DISPOSABLE) ×3
IRRIG SUCT STRYKERFLOW 2 WTIP (MISCELLANEOUS) ×1
IRRIGATION SUCT STRKRFLW 2 WTP (MISCELLANEOUS) ×1 IMPLANT
IV CATH 14GX2 1/4 (CATHETERS) ×1 IMPLANT
KIT BASIN OR (CUSTOM PROCEDURE TRAY) ×1 IMPLANT
KIT TURNOVER KIT B (KITS) ×1 IMPLANT
NS IRRIG 1000ML POUR BTL (IV SOLUTION) ×1 IMPLANT
PAD ARMBOARD 7.5X6 YLW CONV (MISCELLANEOUS) ×1 IMPLANT
SCISSORS LAP 5X35 DISP (ENDOMECHANICALS) ×1 IMPLANT
SET TUBE SMOKE EVAC HIGH FLOW (TUBING) ×1 IMPLANT
SLEEVE Z-THREAD 5X100MM (TROCAR) ×2 IMPLANT
SPECIMEN JAR SMALL (MISCELLANEOUS) ×1 IMPLANT
SUT MNCRL AB 4-0 PS2 18 (SUTURE) ×1 IMPLANT
SYS BAG RETRIEVAL 10MM (BASKET) ×1
SYSTEM BAG RETRIEVAL 10MM (BASKET) ×1 IMPLANT
TOWEL GREEN STERILE (TOWEL DISPOSABLE) ×1 IMPLANT
TOWEL GREEN STERILE FF (TOWEL DISPOSABLE) ×1 IMPLANT
TRAY LAPAROSCOPIC MC (CUSTOM PROCEDURE TRAY) ×1 IMPLANT
TROCAR BALLN 12MMX100 BLUNT (TROCAR) ×1 IMPLANT
TROCAR Z-THREAD OPTICAL 5X100M (TROCAR) ×1 IMPLANT
WARMER LAPAROSCOPE (MISCELLANEOUS) ×1 IMPLANT
WATER STERILE IRR 1000ML POUR (IV SOLUTION) ×1 IMPLANT

## 2023-03-18 NOTE — Interval H&P Note (Signed)
History and Physical Interval Note:  03/18/2023 7:37 AM  Janet Marquez  has presented today for surgery, with the diagnosis of GALLSTONES.  The various methods of treatment have been discussed with the patient and family. After consideration of risks, benefits and other options for treatment, the patient has consented to  Procedure(s): LAPAROSCOPIC CHOLECYSTECTOMY WITH INTRAOPERATIVE CHOLANGIOGRAM (N/A) as a surgical intervention.  The patient's history has been reviewed, patient examined, no change in status, stable for surgery.  I have reviewed the patient's chart and labs.  Questions were answered to the patient's satisfaction.     [name]

## 2023-03-18 NOTE — Op Note (Signed)
03/18/2023  9:54 AM  PATIENT:  Janet Marquez  52 y.o. female  PRE-OPERATIVE DIAGNOSIS:  GALLSTONES  POST-OPERATIVE DIAGNOSIS:  GALLSTONES  PROCEDURE:  Procedure(s): LAPAROSCOPIC CHOLECYSTECTOMY WITH INTRAOPERATIVE CHOLANGIOGRAM (N/A)  SURGEON:  Surgeon(s) and Role:    * Griselda Miner, MD - Primary  PHYSICIAN ASSISTANT:   ASSISTANTS: Rockwell Germany, RNFA   ANESTHESIA:   local and general  EBL:  minimal   BLOOD ADMINISTERED:none  DRAINS: none   LOCAL MEDICATIONS USED:  MARCAINE     SPECIMEN:  Source of Specimen:  gallbladder  DISPOSITION OF SPECIMEN:  PATHOLOGY  COUNTS:  YES  TOURNIQUET:  * No tourniquets in log *  DICTATION: .Dragon Dictation    Procedure: After informed consent was obtained the patient was brought to the operating room and placed in the supine position on the operating room table. After adequate induction of general anesthesia the patient's abdomen was prepped with ChloraPrep allowed to dry and draped in usual sterile manner. An appropriate timeout was performed. The area above the umbilicus was infiltrated with quarter percent  Marcaine. A small incision was made with a 15 blade knife. The incision was carried down through the subcutaneous tissue bluntly with a hemostat and [employer] retractors. The linea alba was identified. The linea alba was incised with a 15 blade knife and each side was grasped with Coker clamps. The preperitoneal space was then probed with a hemostat until the peritoneum was opened and access was gained to the abdominal cavity. A 0 Vicryl pursestring stitch was placed in the fascia surrounding the opening. A Hassan cannula was then placed through the opening and anchored in place with the previously placed Vicryl purse string stitch. The abdomen was insufflated with carbon dioxide without difficulty. A laparoscope was inserted through the [facility] cannula in the right upper quadrant was inspected. Next the epigastric region was  infiltrated with % Marcaine. A small incision was made with a 15 blade knife. A 5 mm port was placed bluntly through this incision into the abdominal cavity under direct vision. Next 2 sites were chosen laterally on the right side of the abdomen for placement of 5 mm ports. Each of these areas was infiltrated with quarter percent Marcaine. Small stab incisions were made with a 15 blade knife. 5 mm ports were then placed bluntly through these incisions into the abdominal cavity under direct vision without difficulty. A blunt grasper was placed through the lateralmost 5 mm port and used to grasp the dome of the gallbladder and elevate it anteriorly and superiorly. Another blunt grasper was placed through the other 5 mm port and used to retract the body and neck of the gallbladder. A dissector was placed through the epigastric port and using the electrocautery the peritoneal reflection at the gallbladder neck was opened. Blunt dissection was then carried out in this area until the gallbladder neck-cystic duct junction was readily identified and a good window was created. A single clip was placed on the gallbladder neck. A small  ductotomy was made just below the clip with laparoscopic scissors. A 14-gauge Angiocath was then placed through the anterior abdominal wall under direct vision. A Reddick cholangiogram catheter was then placed through the Angiocath and flushed. The catheter was then placed in the cystic duct and anchored in place with a clip. A cholangiogram was obtained that showed no filling defects, good emptying into the duodenum, and an adequate length on the cystic duct. The anchoring clip and catheters were then removed from the patient.  3 clips were placed proximally on the cystic duct and the duct was divided between the 2 sets of clips. Posterior to this the cystic artery was identified and again dissected bluntly in a circumferential manner until a good window  was created. 2 clips were placed  proximally and one distally on the artery and the artery was divided between the 2 sets of clips. Next a laparoscopic hook cautery device was used to separate the gallbladder from the liver bed. Prior to completely detaching the gallbladder from the liver bed the liver bed was inspected and several small bleeding points were coagulated with the electrocautery until the area was completely hemostatic. The gallbladder was then detached the rest of it from the liver bed without difficulty. A laparoscopic bag was inserted through the hassan port. The laparoscope was moved to the epigastric port. The gallbladder was placed within the bag and the bag was sealed.  The bag with the gallbladder was then removed with the [facility] cannula through the infraumbilical port without difficulty. The fascial defect was then closed with the previously placed Vicryl pursestring stitch as well as with another figure-of-eight 0 Vicryl stitch. The liver bed was inspected again and found to be hemostatic. The abdomen was irrigated with copious amounts of saline until the effluent was clear. The ports were then removed under direct vision without difficulty and were found to be hemostatic. The gas was allowed to escape. No other abnormalities were noted on general inspection of the abdomen. The skin incisions were all closed with interrupted 4-0 Monocryl subcuticular stitches. Dermabond dressings were applied. The patient tolerated the procedure well. At the end of the case all needle sponge and instrument counts were correct. The patient was then awakened and taken to recovery in stable condition  PLAN OF CARE: Discharge to home after PACU  PATIENT DISPOSITION:  PACU - hemodynamically stable.   Delay start of Pharmacological VTE agent (>24hrs) due to surgical blood loss or risk of bleeding: not applicable

## 2023-03-18 NOTE — Anesthesia Postprocedure Evaluation (Signed)
Anesthesia Post Note  Patient: Janet Marquez  Procedure(s) Performed: LAPAROSCOPIC CHOLECYSTECTOMY WITH INTRAOPERATIVE CHOLANGIOGRAM     Patient location during evaluation: PACU Anesthesia Type: General Level of consciousness: awake and alert Pain management: pain level controlled Vital Signs Assessment: post-procedure vital signs reviewed and stable Respiratory status: spontaneous breathing, nonlabored ventilation and respiratory function stable Cardiovascular status: blood pressure returned to baseline Postop Assessment: no apparent nausea or vomiting Anesthetic complications: no   No notable events documented.  Last Vitals:  Vitals:   03/18/23 1030 03/18/23 1045  BP: 132/89 (!) 136/98  Pulse: 96 92  Resp: 13 19  Temp:  (!) 36.4 C  SpO2: 94% 95%    Last Pain:  Vitals:   03/18/23 1045  TempSrc:   PainSc: 4                  Shanda Howells

## 2023-03-18 NOTE — Anesthesia Procedure Notes (Signed)
Procedure Name: Intubation Date/Time: 03/18/2023 9:02 AM  Performed by: Loleta Tashona Calk, CRNAPre-anesthesia Checklist: Patient identified, Patient being monitored, Timeout performed, Emergency Drugs available and Suction available Patient Re-evaluated:Patient Re-evaluated prior to induction Oxygen Delivery Method: Circle system utilized Preoxygenation: Pre-oxygenation with 100% oxygen Induction Type: IV induction Ventilation: Mask ventilation without difficulty Laryngoscope Size: Mac and 4 Grade View: Grade I Tube type: Oral Tube size: 7.0 mm Number of attempts: 1 Airway Equipment and Method: Stylet Placement Confirmation: ETT inserted through vocal cords under direct vision, positive ETCO2 and breath sounds checked- equal and bilateral Secured at: 22 cm Tube secured with: Tape Dental Injury: Teeth and Oropharynx as per pre-operative assessment

## 2023-03-18 NOTE — H&P (Signed)
REFERRING PHYSICIAN: Ngetich, Donalee Citrin, NP PROVIDER: Lindell Noe, MD MRN: [id number] DOB: [birth year] Subjective   Chief Complaint: New Consultation (Cholecystitis)  History of Present Illness: Janet Marquez is a 52 y.o. female who is seen today as an office consultation for evaluation of New Consultation (Cholecystitis)  We are asked to see the patient in consultation by Dr. Elam Dutch to evaluate her for gallstones. The patient is a 52 year old black female who has been experiencing right upper quadrant abdominal pain since November of last year. The pain seems to come and go. The pain has been associated with some nausea but no vomiting. She underwent a CT scan in November of last year that showed stones in the gallbladder but no gallbladder wall thickening or ductal dilatation. Her liver functions were essentially normal with only very mild elevation of her SGOT and SGPT. Some of this was attributed to her statin apparently. She is otherwise in good health other than hypertension and diabetes and does not smoke  Review of Systems: A complete review of systems was obtained from the patient. I have reviewed this information and discussed as appropriate with the patient. See HPI as well for other ROS.  ROS   Medical History: Past Medical History:  Diagnosis Date  Anemia  Anxiety  Diabetes mellitus without complication (CMS/HHS-HCC)  Glaucoma (increased eye pressure)  Hypertension  Thyroid disease   Patient Active Problem List  Diagnosis  Calculus of gallbladder without cholecystitis without obstruction   Past Surgical History:  Procedure Laterality Date  CESAREAN DELIVERY FAILED VAGINAL DELIVERY W/PREVIOUS CESARIAN  HYSTERECTOMY  lipoma removal    Allergies  Allergen Reactions  Biaxin [Clarithromycin] Unknown   Current Outpatient Medications on File Prior to Visit  Medication Sig Dispense Refill  escitalopram oxalate (LEXAPRO) 10 MG tablet Take by mouth   hydroCHLOROthiazide (HYDRODIURIL) 12.5 MG tablet Take 12.5 mg by mouth once daily  lisinopriL (ZESTRIL) 5 MG tablet Take 5 mg by mouth once daily  metFORMIN (GLUCOPHAGE) 500 MG tablet Take 500 mg by mouth 2 (two) times daily with meals  metoprolol tartrate (LOPRESSOR) 100 MG tablet Take 100 mg by mouth 2 (two) times daily  MOUNJARO 2.5 mg/0.5 mL PnIj INJECT 2.5 MG INTO THE SKIN ONCE A WEEK  rosuvastatin (CRESTOR) 5 MG tablet Take 5 mg by mouth once daily   No current facility-administered medications on file prior to visit.   Family History  Problem Relation Age of Onset  Hyperlipidemia (Elevated cholesterol) Mother  High blood pressure (Hypertension) Mother  Diabetes Mother    Social History   Tobacco Use  Smoking Status Never  Smokeless Tobacco Never    Social History   Socioeconomic History  Marital status: Single  Tobacco Use  Smoking status: Never  Smokeless tobacco: Never  Vaping Use  Vaping status: Never Used  Substance and Sexual Activity  Alcohol use: Never  Drug use: Never   Objective:   Vitals:  BP: (!) 163/104  Pulse: 89  Temp: 36.6 C (97.9 F)  SpO2: 97%  Weight: 85.3 kg (188 lb)  Height: 170.2 cm (5\' 7" )   Body mass index is 29.44 kg/m.  Physical Exam Vitals reviewed.  Constitutional:  General: She is not in acute distress. Appearance: Normal appearance.  HENT:  Head: Normocephalic and atraumatic.  Right Ear: External ear normal.  Left Ear: External ear normal.  Nose: Nose normal.  Mouth/Throat:  Mouth: Mucous membranes are moist.  Pharynx: Oropharynx is clear.  Eyes:  General: No scleral icterus. Extraocular Movements: Extraocular  movements intact.  Conjunctiva/sclera: Conjunctivae normal.  Pupils: Pupils are equal, round, and reactive to light.  Cardiovascular:  Rate and Rhythm: Normal rate and regular rhythm.  Pulses: Normal pulses.  Heart sounds: Normal heart sounds.  Pulmonary:  Effort: Pulmonary effort is normal. No  respiratory distress.  Breath sounds: Normal breath sounds.  Abdominal:  General: Bowel sounds are normal.  Palpations: Abdomen is soft.  Tenderness: There is no abdominal tenderness.  Comments: There is mild right upper quadrant pain with palpation. There is no palpable mass.  Musculoskeletal:  General: No swelling, tenderness or deformity. Normal range of motion.  Cervical back: Normal range of motion and neck supple.  Skin: General: Skin is warm and dry.  Coloration: Skin is not jaundiced.  Neurological:  General: No focal deficit present.  Mental Status: She is alert and oriented to person, place, and time.  Psychiatric:  Mood and Affect: Mood normal.  Behavior: Behavior normal.     Labs, Imaging and Diagnostic Testing:  Assessment and Plan:   Diagnoses and all orders for this visit:  Calculus of gallbladder without cholecystitis without obstruction - [facility] Case Posting Request; Future   The patient appears to have symptomatic gallstones. Because of the risk of further painful episodes and possible pancreatitis I feel she would benefit from having her gallbladder removed. She would also like to have this done. I have discussed with her in detail the risks and benefits of the operation as well as some of the technical aspects including the risk of common bile duct injury and she understands and wishes to proceed

## 2023-03-18 NOTE — Transfer of Care (Signed)
Immediate Anesthesia Transfer of Care Note  Patient: Janet Marquez  Procedure(s) Performed: LAPAROSCOPIC CHOLECYSTECTOMY WITH INTRAOPERATIVE CHOLANGIOGRAM  Patient Location: PACU  Anesthesia Type:General  Level of Consciousness: sedated  Airway & Oxygen Therapy: Patient Spontanous Breathing and Patient connected to face mask oxygen  Post-op Assessment: Report given to RN and Post -op Vital signs reviewed and stable  Post vital signs: Reviewed and stable  Last Vitals:  Vitals Value Taken Time  BP 138/86 03/18/23 1015  Temp    Pulse 90 03/18/23 1015  Resp 13   SpO2 98 % 03/18/23 1015  Vitals shown include unvalidated device data.  Last Pain:  Vitals:   03/18/23 0755  TempSrc:   PainSc: 1       Patients Stated Pain Goal: 2 (03/18/23 0755)  Complications: No notable events documented.

## 2023-03-19 ENCOUNTER — Encounter (HOSPITAL_COMMUNITY): Payer: Self-pay | Admitting: General Surgery

## 2023-03-19 LAB — SURGICAL PATHOLOGY

## 2023-04-19 ENCOUNTER — Encounter: Payer: Self-pay | Admitting: Gastroenterology

## 2023-05-03 ENCOUNTER — Encounter: Payer: Self-pay | Admitting: Gastroenterology

## 2023-05-03 ENCOUNTER — Ambulatory Visit (INDEPENDENT_AMBULATORY_CARE_PROVIDER_SITE_OTHER): Payer: 59 | Admitting: Gastroenterology

## 2023-05-03 VITALS — BP 122/78 | HR 84 | Ht 66.0 in | Wt 175.0 lb

## 2023-05-03 DIAGNOSIS — R6881 Early satiety: Secondary | ICD-10-CM | POA: Diagnosis not present

## 2023-05-03 DIAGNOSIS — R1012 Left upper quadrant pain: Secondary | ICD-10-CM | POA: Diagnosis not present

## 2023-05-03 DIAGNOSIS — Z1211 Encounter for screening for malignant neoplasm of colon: Secondary | ICD-10-CM

## 2023-05-03 DIAGNOSIS — R195 Other fecal abnormalities: Secondary | ICD-10-CM

## 2023-05-03 DIAGNOSIS — R630 Anorexia: Secondary | ICD-10-CM

## 2023-05-03 MED ORDER — PANTOPRAZOLE SODIUM 40 MG PO TBEC: 40.0000 mg | DELAYED_RELEASE_TABLET | Freq: Every day | ORAL | 1 refills | Status: DC

## 2023-05-03 MED ORDER — NA SULFATE-K SULFATE-MG SULF 17.5-3.13-1.6 GM/177ML PO SOLN: 1.0000 | Freq: Once | ORAL | 0 refills | Status: AC

## 2023-05-03 MED ORDER — COLESTIPOL HCL 1 G PO TABS: 2.0000 g | ORAL_TABLET | Freq: Two times a day (BID) | ORAL | 2 refills | Status: DC

## 2023-05-03 NOTE — Progress Notes (Signed)
Attending Physician's Attestation   I have reviewed the chart.   I agree with the Advanced Practitioner's note, impression, and recommendations with any updates as below.    Gabriel Mansouraty, MD [facility] Advanced Endoscopy Office # [phone]  

## 2023-05-03 NOTE — Patient Instructions (Addendum)
We have sent the following medications to your pharmacy for you to pick up at your convenience: Pantoprazole, Colestipol, suprep  You have been scheduled for an endoscopy and colonoscopy. Please follow the written instructions given to you at your visit today.  Please pick up your prep supplies at the pharmacy within the next 1-3 days.  If you use inhalers (even only as needed), please bring them with you on the day of your procedure.  DO NOT TAKE 7 DAYS PRIOR TO TEST- Trulicity (dulaglutide) Ozempic, Wegovy (semaglutide) Mounjaro (tirzepatide) Bydureon Bcise (exanatide extended release)  DO NOT TAKE 1 DAY PRIOR TO YOUR TEST Rybelsus (semaglutide) Adlyxin (lixisenatide) Victoza (liraglutide) Byetta (exanatide) ___________________________________________________________________________   _______________________________________________________  If your blood pressure at your visit was 140/90 or greater, please contact your primary care physician to follow up on this.  _______________________________________________________  If you are age 76 or younger, your body mass index should be between 19-25. Your Body mass index is 28.25 kg/m. If this is out of the aformentioned range listed, please consider follow up with your Primary Care Provider.   __________________________________________________________  The [facility] providers would like to encourage you to use [facility] to communicate with providers for non-urgent requests or questions.  Due to long hold times on the telephone, sending your provider a message by [facility] may be a faster and more efficient way to get a response.  Please allow 48 business hours for a response.  Please remember that this is for non-urgent requests.    _______________________________________________________  If your blood pressure at your visit was 140/90 or greater, please contact your primary care physician to follow up on  this.  _______________________________________________________  If you are age 29 or older, your body mass index should be between 23-30. Your Body mass index is 28.25 kg/m. If this is out of the aforementioned range listed, please consider follow up with your Primary Care Provider.  If you are age 17 or younger, your body mass index should be between 19-25. Your Body mass index is 28.25 kg/m. If this is out of the aformentioned range listed, please consider follow up with your Primary Care Provider.   __________________________________________________________  The [facility] providers would like to encourage you to use [facility] to communicate with providers for non-urgent requests or questions.  Due to long hold times on the telephone, sending your provider a message by [facility] may be a faster and more efficient way to get a response.  Please allow 48 business hours for a response.  Please remember that this is for non-urgent requests.   Thank you for choosing me and [facility].  Boone Master, PA-C

## 2023-05-03 NOTE — Progress Notes (Signed)
Chief Complaint: LUQ pain, loss of appetite, early satiety Primary GI MD: Gentry Fitz  HPI:  52 year old female, s/p cholecystectomy 03/18/23, presents for evaluation of multiple GI symptoms  Patient had been experiencing intermittent RUQ pain since November 2023 with associated nausea. CT showed stones in the gallbladder but no gallbladder wall thickening or ductal dilation. LFTs only mildly elevated (thought to be due to statin).  Patient underweight lap chole 03/18/23 with negative IOC.  Patient states she has had a constant dull LUQ pain for almost a year. No identifiable trigger. Reports her PCP told her it was indigestion but she has not tried an antacid. She also reports decreased appetite and early satiety as well as some LLQ cramping. States she was previously on Ozempic and recently switched to [facility]. She states she can only eats a few bites or a small meal before feeling full.  Reports loose/explosive stools since cholecystectomy. Will have 3-4 Bms per day and sometimes has nocturnal bowel movements. Denies melena/hematochezia.  Adequate EF 50-55%   PREVIOUS GI WORKUP   Reports previous colonoscopy "many moons ago" and unsure what it showed. Done with Mann/Hung.  Past Medical History:  Diagnosis Date   Acid reflux    Anemia    Anxiety    Complication of anesthesia    patient had lipomas in 2019 removed and has experienced itching since then intermittently   Diabetes mellitus (HCC)    type II   Gallstones    GERD (gastroesophageal reflux disease)    Hx of cardiovascular stress test    ETT-echo 3/16: Normal   Hyperlipidemia    Hypertension    Inflammatory bowel diseases (IBD)    Thyroid disease    Vertigo     Past Surgical History:  Procedure Laterality Date   BILATERAL SALPINGECTOMY Left 01/20/2015   Procedure: LAPAROSCOPIC LEFT SALPINGECTOMY;  Surgeon: Harold Hedge, MD;  Location: [facility];  Service: Gynecology;  Laterality: Left;   CESAREAN SECTION  2006    CHOLECYSTECTOMY N/A 03/18/2023   Procedure: LAPAROSCOPIC CHOLECYSTECTOMY WITH INTRAOPERATIVE CHOLANGIOGRAM;  Surgeon: Griselda Miner, MD;  Location: [facility] OR;  Service: General;  Laterality: N/A;   LAPAROSCOPIC ASSISTED VAGINAL HYSTERECTOMY N/A 01/20/2015   Procedure: LAPAROSCOPIC ASSISTED VAGINAL HYSTERECTOMY;  Surgeon: Harold Hedge, MD;  Location: [facility];  Service: Gynecology;  Laterality: N/A;   LIPOMA EXCISION  2019   MYOMECTOMY     2002    Current Outpatient Medications  Medication Sig Dispense Refill   escitalopram (LEXAPRO) 10 MG tablet Take 1 tablet (10 mg total) by mouth daily. Needs Appointment before anymore future refills. 30 tablet 0   fluticasone (FLONASE) 50 MCG/ACT nasal spray Place 1 spray into both nostrils as needed for allergies or rhinitis. 16 g 5   hydrochlorothiazide (HYDRODIURIL) 12.5 MG tablet Take 1 tablet (12.5 mg total) by mouth daily. 90 tablet 1   lisinopril (ZESTRIL) 5 MG tablet Take 1 tablet by mouth once daily 90 tablet 1   loratadine (CLARITIN) 10 MG tablet Take 1 tablet (10 mg total) by mouth daily. 30 tablet 11   metFORMIN (GLUCOPHAGE) 500 MG tablet Take 1 tablet (500 mg total) by mouth 2 (two) times daily with a meal. 360 tablet 1   metoprolol tartrate (LOPRESSOR) 100 MG tablet Take 1 tablet (100 mg total) by mouth 2 (two) times daily. 180 tablet 1   MOUNJARO 2.5 MG/0.5ML Pen INJECT 2.5 MG SUBCUTANEOUSLY ONCE A WEEK 4 mL 0   Olopatadine HCl (PATADAY) 0.7 % SOLN Place  1 drop into both eyes daily as needed (allergies).     Polyethyl Glycol-Propyl Glycol (SYSTANE) 0.4-0.3 % SOLN Place 1 drop into both eyes in the morning, at noon, and at bedtime.     rosuvastatin (CRESTOR) 5 MG tablet Take 1 tablet (5 mg total) by mouth daily. 30 tablet 5   No current facility-administered medications for this visit.    Allergies as of 05/03/2023 - Review Complete 05/03/2023  Allergen Reaction Noted   Clarithromycin Nausea And Vomiting     Family History  Problem Relation  Age of Onset   Diabetes Mother    Hypertension Mother    Diverticulosis Mother    Arthritis Mother    Other Mother        GIST tumor   Colon polyps Mother    Liver disease Mother        elevated LFT's   Hypertension Maternal Grandmother    Stroke Maternal Grandmother    Dementia Maternal Grandmother    Lung cancer Maternal Grandfather        non-smoker   Heart attack Maternal Grandfather    Breast cancer Maternal Aunt     Social History   Socioeconomic History   Marital status: Single    Spouse name: Not on file   Number of children: 1   Years of education: Not on file   Highest education level: Not on file  Occupational History   Not on file  Tobacco Use   Smoking status: Never   Smokeless tobacco: Never  Vaping Use   Vaping status: Never Used  Substance and Sexual Activity   Alcohol use: Yes    Comment: occasionally   Drug use: Never   Sexual activity: Not Currently  Other Topics Concern   Not on file  Social History Narrative   Diet:       Caffeine:yes      Married, if yes what year: Single      Do you live in a house, apartment, assisted living, condo, trailer, ect: House      Is it one or more stories: 1      How many persons live in your home? 3      Pets: No      Highest level or education completed: some college      Current/Past profession: claims specialist       Exercise: Yes                 Type and how often: ride bike, cubii, treadmill few times a week         Living Will: No   DNR: No   POA/HPOA: No      Functional Status:   Do you have difficulty bathing or dressing yourself? No   Do you have difficulty preparing food or eating? No   Do you have difficulty managing your medications? No   Do you have difficulty managing your finances? No   Do you have difficulty affording your medications? Yes   Social Determinants of Health   Financial Resource Strain: Not on file  Food Insecurity: Not on file  Transportation Needs: Not on  file  Physical Activity: Not on file  Stress: Not on file  Social Connections: Not on file  Intimate Partner Violence: Not on file    Review of Systems:    Constitutional: No weight loss, fever, chills, weakness or fatigue HEENT: Eyes: No change in vision  Ears, Nose, Throat:  No change in hearing or congestion Skin: No rash or itching Cardiovascular: No chest pain, chest pressure or palpitations   Respiratory: No SOB or cough Gastrointestinal: See HPI and otherwise negative Genitourinary: No dysuria or change in urinary frequency Neurological: No headache, dizziness or syncope Musculoskeletal: No new muscle or joint pain Hematologic: No bleeding or bruising Psychiatric: No history of depression or anxiety    Physical Exam:  Vital signs: BP 122/78   Pulse 84   Ht 5\' 6"  (1.676 m)   Wt 79.4 kg   LMP 11/21/2014   BMI 28.25 kg/m   Constitutional: NAD, Well developed, Well nourished, alert and cooperative Head:  Normocephalic and atraumatic. Eyes:   PEERL, EOMI. No icterus. Conjunctiva pink. Respiratory: Respirations even and unlabored. Lungs clear to auscultation bilaterally.   No wheezes, crackles, or rhonchi.  Cardiovascular:  Regular rate and rhythm. No peripheral edema, cyanosis or pallor.  Gastrointestinal:  Soft, nondistended, nontender. No rebound or guarding. Normal bowel sounds. No appreciable masses or hepatomegaly. Rectal:  Not performed.  Msk:  Symmetrical without gross deformities. Without edema, no deformity or joint abnormality.  Neurologic:  Alert and  oriented x4;  grossly normal neurologically.  Skin:   Dry and intact without significant lesions or rashes. Psychiatric: Oriented to person, place and time. Demonstrates good judgement and reason without abnormal affect or behaviors.   RELEVANT LABS AND IMAGING: CBC    Component Value Date/Time   WBC 9.3 03/13/2023 0912   RBC 5.20 (H) 03/13/2023 0912   HGB 13.6 03/13/2023 0912   HGB 11.8  01/14/2017 0937   HCT 43.1 03/13/2023 0912   HCT 35.6 01/14/2017 0937   PLT 363 03/13/2023 0912   PLT 395 (H) 01/14/2017 0937   MCV 82.9 03/13/2023 0912   MCV 80 01/14/2017 0937   MCH 26.2 03/13/2023 0912   MCHC 31.6 03/13/2023 0912   RDW 13.2 03/13/2023 0912   RDW 14.6 01/14/2017 0937   LYMPHSABS 3,060 12/05/2022 0822   LYMPHSABS 1.8 01/14/2017 0937   MONOABS 0.4 05/16/2021 0924   EOSABS 64 12/05/2022 0822   EOSABS 0.1 01/14/2017 0937   BASOSABS 32 12/05/2022 0822   BASOSABS 0.0 01/14/2017 0937    CMP     Component Value Date/Time   NA 138 03/13/2023 0912   NA 140 01/14/2017 0937   K 4.3 03/13/2023 0912   CL 104 03/13/2023 0912   CO2 24 03/13/2023 0912   GLUCOSE 131 (H) 03/13/2023 0912   BUN 11 03/13/2023 0912   BUN 8 01/14/2017 0937   CREATININE 0.87 03/13/2023 0912   CREATININE 0.75 12/05/2022 0822   CALCIUM 9.8 03/13/2023 0912   PROT 7.1 02/11/2023 0724   ALBUMIN 4.5 02/11/2023 0724   AST 20 02/11/2023 0724   ALT 14 02/11/2023 0724   ALKPHOS 124 (H) 02/11/2023 0724   BILITOT 0.3 02/11/2023 0724   GFRNONAA >60 03/13/2023 0912   GFRAA 99 01/14/2017 0937     Assessment/Plan:   LUQ pain Decreased appetite Early satiety Multiple upper symptoms. DDX includes gastritis, esophagitis, and PUD. Also suspect her early satiety and decreased appetite are secondary to delayed gastric emptying from mounjaro use. Her pain/cramping could also be a side effect from that medication. - EGD for further evaluation - I thoroughly discussed the procedure with the patient (at bedside) to include nature of the procedure, alternatives, benefits, and risks (including but not limited to bleeding, infection, perforation, anesthesia/cardiac pulmonary complications).  Patient verbalized understanding and gave verbal consent  to proceed with procedure.  - Educated patient on lifestyle modifications - trial of pantoprazole 40mg  once daily - f/u per procedure - if EGD is unrevealing, may need  to consider talking with PCP about cessation of mounjaro and switching to a different medication.  Screening for colon cancer Reports previous colonoscopy many years ago with mann/hung. Will obtain report - repeat colonoscopy - I thoroughly discussed the procedure with the patient (at bedside) to include nature of the procedure, alternatives, benefits, and risks (including but not limited to bleeding, infection, perforation, anesthesia/cardiac pulmonary complications).  Patient verbalized understanding and gave verbal consent to proceed with procedure.   Loose stools Ongoing since cholecystectomy. Suspect side effect from mounjaro versus residual bowel changes as a result of her surgery. She reports it's slowly improving - advised to continue to avoid fatty/greasy foods - trial of colestipol for bile acid diarrhea.  Lara Mulch  Gastroenterology 05/03/2023, 2:10 PM  Cc: Ngetich, Donalee Citrin, NP

## 2023-06-13 ENCOUNTER — Other Ambulatory Visit: Payer: Self-pay | Admitting: Family

## 2023-06-13 DIAGNOSIS — E1165 Type 2 diabetes mellitus with hyperglycemia: Secondary | ICD-10-CM

## 2023-06-14 ENCOUNTER — Telehealth: Payer: Self-pay

## 2023-06-14 NOTE — Telephone Encounter (Signed)
Prior authorization request received from covermymeds for mounjaro 2.5mg /0.79ml. Prior authorization initiated and waiting on reply

## 2023-06-27 ENCOUNTER — Other Ambulatory Visit: Payer: Self-pay | Admitting: Family

## 2023-06-27 DIAGNOSIS — R0981 Nasal congestion: Secondary | ICD-10-CM

## 2023-06-27 DIAGNOSIS — I10 Essential (primary) hypertension: Secondary | ICD-10-CM

## 2023-07-09 ENCOUNTER — Ambulatory Visit: Payer: 59 | Admitting: Gastroenterology

## 2023-07-09 ENCOUNTER — Encounter: Payer: 59 | Admitting: Gastroenterology

## 2023-07-09 ENCOUNTER — Encounter: Payer: Self-pay | Admitting: Gastroenterology

## 2023-07-09 VITALS — BP 136/83 | HR 83 | Temp 98.1°F | Resp 15 | Ht 66.0 in | Wt 175.0 lb

## 2023-07-09 DIAGNOSIS — K2950 Unspecified chronic gastritis without bleeding: Secondary | ICD-10-CM | POA: Diagnosis not present

## 2023-07-09 DIAGNOSIS — R1012 Left upper quadrant pain: Secondary | ICD-10-CM | POA: Diagnosis not present

## 2023-07-09 DIAGNOSIS — Z1211 Encounter for screening for malignant neoplasm of colon: Secondary | ICD-10-CM

## 2023-07-09 DIAGNOSIS — R1310 Dysphagia, unspecified: Secondary | ICD-10-CM | POA: Diagnosis not present

## 2023-07-09 DIAGNOSIS — K5732 Diverticulitis of large intestine without perforation or abscess without bleeding: Secondary | ICD-10-CM | POA: Diagnosis not present

## 2023-07-09 MED ORDER — CHOLESTYRAMINE LIGHT 4 G PO PACK: 4.0000 g | PACK | Freq: Two times a day (BID) | ORAL | 6 refills | Status: DC | PRN

## 2023-07-09 MED ORDER — SODIUM CHLORIDE 0.9 % IV SOLN: 500.0000 mL | Freq: Once | INTRAVENOUS | Status: DC

## 2023-07-09 NOTE — Progress Notes (Signed)
J Monday CRNA notified by Felton Clinton pt had hives and breakout from anesthesia in past .Patient not sure of anesthesia name.

## 2023-07-09 NOTE — Progress Notes (Signed)
GASTROENTEROLOGY PROCEDURE H&P NOTE   Primary Care Physician: Ngetich, Donalee Citrin, NP  HPI: Janet Marquez is a 52 y.o. female who presents for EGD/Colonoscopy to evaluate issues of LUQ pain, anorexia, decreased appetite, early satiety, change in bowel habits, Colon cancer screening.  Past Medical History:  Diagnosis Date   Acid reflux    Anemia    Anxiety    Complication of anesthesia    patient had lipomas in 2019 removed and has experienced itching since then intermittently   Diabetes mellitus (HCC)    type II   Gallstones    GERD (gastroesophageal reflux disease)    Hx of cardiovascular stress test    ETT-echo 3/16: Normal   Hyperlipidemia    Hypertension    Inflammatory bowel diseases (IBD)    Thyroid disease    Vertigo    Past Surgical History:  Procedure Laterality Date   BILATERAL SALPINGECTOMY Left 01/20/2015   Procedure: LAPAROSCOPIC LEFT SALPINGECTOMY;  Surgeon: Harold Hedge, MD;  Location: [facility];  Service: Gynecology;  Laterality: Left;   CESAREAN SECTION  2006   CHOLECYSTECTOMY N/A 03/18/2023   Procedure: LAPAROSCOPIC CHOLECYSTECTOMY WITH INTRAOPERATIVE CHOLANGIOGRAM;  Surgeon: Griselda Miner, MD;  Location: [facility] OR;  Service: General;  Laterality: N/A;   LAPAROSCOPIC ASSISTED VAGINAL HYSTERECTOMY N/A 01/20/2015   Procedure: LAPAROSCOPIC ASSISTED VAGINAL HYSTERECTOMY;  Surgeon: Harold Hedge, MD;  Location: [facility];  Service: Gynecology;  Laterality: N/A;   LIPOMA EXCISION  2019   MYOMECTOMY     2002   Current Outpatient Medications  Medication Sig Dispense Refill   EQ ALL DAY ALLERGY RELIEF 10 MG tablet Take 1 tablet by mouth once daily 30 tablet 0   hydrochlorothiazide (HYDRODIURIL) 12.5 MG tablet Take 1 tablet (12.5 mg total) by mouth daily. 90 tablet 1   lisinopril (ZESTRIL) 5 MG tablet Take 1 tablet by mouth once daily 90 tablet 1   metFORMIN (GLUCOPHAGE) 500 MG tablet Take 1 tablet (500 mg total) by mouth 2 (two) times daily with a meal. 360 tablet 1    metoprolol tartrate (LOPRESSOR) 100 MG tablet Take 1 tablet by mouth twice daily 60 tablet 0   Olopatadine HCl (PATADAY) 0.7 % SOLN Place 1 drop into both eyes daily as needed (allergies).     Polyethyl Glycol-Propyl Glycol (SYSTANE) 0.4-0.3 % SOLN Place 1 drop into both eyes in the morning, at noon, and at bedtime.     rosuvastatin (CRESTOR) 5 MG tablet Take 1 tablet (5 mg total) by mouth daily. 30 tablet 5   colestipol (COLESTID) 1 g tablet Take 2 tablets (2 g total) by mouth 2 (two) times daily. (Patient not taking: Reported on 07/09/2023) 60 tablet 2   escitalopram (LEXAPRO) 10 MG tablet Take 1 tablet (10 mg total) by mouth daily. Needs Appointment before anymore future refills. (Patient not taking: Reported on 07/09/2023) 30 tablet 0   fluticasone (FLONASE) 50 MCG/ACT nasal spray Place 1 spray into both nostrils as needed for allergies or rhinitis. 16 g 5   pantoprazole (PROTONIX) 40 MG tablet Take 1 tablet (40 mg total) by mouth daily. 30 tablet 1   tirzepatide (MOUNJARO) 2.5 MG/0.5ML Pen INJECT 2.5 MG SUBCUTANEOUSLY ONCE A WEEK 8 mL 0   No current facility-administered medications for this visit.    Current Outpatient Medications:    EQ ALL DAY ALLERGY RELIEF 10 MG tablet, Take 1 tablet by mouth once daily, Disp: 30 tablet, Rfl: 0   hydrochlorothiazide (HYDRODIURIL) 12.5 MG tablet, Take 1 tablet (  12.5 mg total) by mouth daily., Disp: 90 tablet, Rfl: 1   lisinopril (ZESTRIL) 5 MG tablet, Take 1 tablet by mouth once daily, Disp: 90 tablet, Rfl: 1   metFORMIN (GLUCOPHAGE) 500 MG tablet, Take 1 tablet (500 mg total) by mouth 2 (two) times daily with a meal., Disp: 360 tablet, Rfl: 1   metoprolol tartrate (LOPRESSOR) 100 MG tablet, Take 1 tablet by mouth twice daily, Disp: 60 tablet, Rfl: 0   Olopatadine HCl (PATADAY) 0.7 % SOLN, Place 1 drop into both eyes daily as needed (allergies)., Disp: , Rfl:    Polyethyl Glycol-Propyl Glycol (SYSTANE) 0.4-0.3 % SOLN, Place 1 drop into both eyes in the  morning, at noon, and at bedtime., Disp: , Rfl:    rosuvastatin (CRESTOR) 5 MG tablet, Take 1 tablet (5 mg total) by mouth daily., Disp: 30 tablet, Rfl: 5   colestipol (COLESTID) 1 g tablet, Take 2 tablets (2 g total) by mouth 2 (two) times daily. (Patient not taking: Reported on 07/09/2023), Disp: 60 tablet, Rfl: 2   escitalopram (LEXAPRO) 10 MG tablet, Take 1 tablet (10 mg total) by mouth daily. Needs Appointment before anymore future refills. (Patient not taking: Reported on 07/09/2023), Disp: 30 tablet, Rfl: 0   fluticasone (FLONASE) 50 MCG/ACT nasal spray, Place 1 spray into both nostrils as needed for allergies or rhinitis., Disp: 16 g, Rfl: 5   pantoprazole (PROTONIX) 40 MG tablet, Take 1 tablet (40 mg total) by mouth daily., Disp: 30 tablet, Rfl: 1   tirzepatide (MOUNJARO) 2.5 MG/0.5ML Pen, INJECT 2.5 MG SUBCUTANEOUSLY ONCE A WEEK, Disp: 8 mL, Rfl: 0 Allergies  Allergen Reactions   Clarithromycin Nausea And Vomiting   Family History  Problem Relation Age of Onset   Diabetes Mother    Hypertension Mother    Diverticulosis Mother    Arthritis Mother    Other Mother        GIST tumor   Colon polyps Mother    Liver disease Mother        elevated LFT's   Hypertension Maternal Grandmother    Stroke Maternal Grandmother    Dementia Maternal Grandmother    Lung cancer Maternal Grandfather        non-smoker   Heart attack Maternal Grandfather    Breast cancer Maternal Aunt    Social History   Socioeconomic History   Marital status: Single    Spouse name: Not on file   Number of children: 1   Years of education: Not on file   Highest education level: Not on file  Occupational History   Not on file  Tobacco Use   Smoking status: Never   Smokeless tobacco: Never  Vaping Use   Vaping status: Never Used  Substance and Sexual Activity   Alcohol use: Yes    Comment: occasionally   Drug use: Never   Sexual activity: Not Currently  Other Topics Concern   Not on file  Social  History Narrative   Diet:       Caffeine:yes      Married, if yes what year: Single      Do you live in a house, apartment, assisted living, condo, trailer, ect: House      Is it one or more stories: 1      How many persons live in your home? 3      Pets: No      Highest level or education completed: some college      Current/Past profession: Wellsite geologist  Exercise: Yes                 Type and how often: ride bike, cubii, treadmill few times a week         Living Will: No   DNR: No   POA/HPOA: No      Functional Status:   Do you have difficulty bathing or dressing yourself? No   Do you have difficulty preparing food or eating? No   Do you have difficulty managing your medications? No   Do you have difficulty managing your finances? No   Do you have difficulty affording your medications? Yes   Social Determinants of Health   Financial Resource Strain: Not on file  Food Insecurity: Not on file  Transportation Needs: Not on file  Physical Activity: Not on file  Stress: Not on file  Social Connections: Not on file  Intimate Partner Violence: Not on file    Physical Exam: Today's Vitals   07/09/23 1302 07/09/23 1313  BP: (!) 137/96   Temp: 98.1 F (36.7 C) 98.1 F (36.7 C)  TempSrc: Skin   SpO2: 99%   Weight: 175 lb (79.4 kg)   Height: 5\' 6"  (1.676 m)    Body mass index is 28.25 kg/m. GEN: NAD EYE: Sclerae anicteric ENT: MMM CV: Non-tachycardic GI: Soft, NT/ND NEURO:  Alert & Oriented x 3  Lab Results: No results for input(s): "WBC", "HGB", "HCT", "PLT" in the last 72 hours. BMET No results for input(s): "NA", "K", "CL", "CO2", "GLUCOSE", "BUN", "CREATININE", "CALCIUM" in the last 72 hours. LFT No results for input(s): "PROT", "ALBUMIN", "AST", "ALT", "ALKPHOS", "BILITOT", "BILIDIR", "IBILI" in the last 72 hours. PT/INR No results for input(s): "LABPROT", "INR" in the last 72 hours.   Impression / Plan: This is a 51 y.o.female who  presents for EGD/Colonoscopy to evaluate issues of LUQ pain, anorexia, decreased appetite, early satiety, change in bowel habits, Colon cancer screening.  The risks and benefits of endoscopic evaluation/treatment were discussed with the patient and/or family; these include but are not limited to the risk of perforation, infection, bleeding, missed lesions, lack of diagnosis, severe illness requiring hospitalization, as well as anesthesia and sedation related illnesses.  The patient's history has been reviewed, patient examined, no change in status, and deemed stable for procedure.  The patient and/or family is agreeable to proceed.    Corliss Parish, MD [facility] Advanced [facility]oscopy Office # [phone]

## 2023-07-09 NOTE — Patient Instructions (Addendum)
Resume previous diet tomorrow, post dilation diet today, then  High fiber diet encouraged Continue present medications Await pathology results There were no colon polyps seen today!  You will need another screening colonoscopy in 10 years, you will receive a letter at that time when you are due for the procedure.   Please call us at [phone] if you have a change in bowel habits, change in family history of colo-rectal cancer, rectal bleeding or other GI concern before that time.  Follow up office visit in 2-3 months (scheduled for 10-17-23 at 3:30 PM) they will call you if another appt opens up for the end of November or in December  Handouts/information given for Post-Dilation diet, Dilation, hiatal hernia, high fiber diet, diverticulosis and hemorrhoids  YOU HAD AN ENDOSCOPIC PROCEDURE TODAY AT THE [facility]:   Refer to the procedure report that was given to you for any specific questions about what was found during the examination.  If the procedure report does not answer your questions, please call your gastroenterologist to clarify.  If you requested that your care partner not be given the details of your procedure findings, then the procedure report has been included in a sealed envelope for you to review at your convenience later.  YOU SHOULD EXPECT: Some feelings of bloating in the abdomen. Passage of more gas than usual.  Walking can help get rid of the air that was put into your GI tract during the procedure and reduce the bloating. If you had a lower endoscopy (such as a colonoscopy or flexible sigmoidoscopy) you may notice spotting of blood in your stool or on the toilet paper. If you underwent a bowel prep for your procedure, you may not have a normal bowel movement for a few days.  Please Note:  You might notice some irritation and congestion in your nose or some drainage.  This is from the oxygen used during your procedure.  There is no need for concern and it should  clear up in a day or so.  SYMPTOMS TO REPORT IMMEDIATELY:  Following lower endoscopy (colonoscopy):  Excessive amounts of blood in the stool  Significant tenderness or worsening of abdominal pains  Swelling of the abdomen that is new, acute  Fever of 100F or higher Following upper endoscopy (EGD)  Vomiting of blood or coffee ground material  New chest pain or pain under the shoulder blades  Painful or persistently difficult swallowing  New shortness of breath  Black, tarry-looking stools  For urgent or emergent issues, a gastroenterologist can be reached at any hour by calling [phone]. Do not use MyChart messaging for urgent concerns.   DIET:  We do recommend a small meal at first, but then you may proceed to your regular diet.  Drink plenty of fluids but you should avoid alcoholic beverages for 24 hours.  ACTIVITY:  You should plan to take it easy for the rest of today and you should NOT DRIVE or use heavy machinery until tomorrow (because of the sedation medicines used during the test).    FOLLOW UP: Our staff will call the number listed on your records the next business day following your procedure.  We will call around 7:15- 8:00 am to check on you and address any questions or concerns that you may have regarding the information given to you following your procedure. If we do not reach you, we will leave a message.     If any biopsies were taken you will be  contacted by phone or by letter within the next 1-3 weeks.  Please call us at [phone] if you have not heard about the biopsies in 3 weeks.    SIGNATURES/CONFIDENTIALITY: You and/or your care partner have signed paperwork which will be entered into your electronic medical record.  These signatures attest to the fact that that the information above on your After Visit Summary has been reviewed and is understood.  Full responsibility of the confidentiality of this discharge information lies with you and/or your  care-partner.

## 2023-07-09 NOTE — Op Note (Signed)
[facility] Patient Name: Janet Marquez Procedure Date: 07/09/2023 1:29 PM MRN: [id number] Endoscopist: Corliss Parish , MD, [id number] Age: 52 Referring MD:  Date of Birth: [birth year] Gender: Female Account #: [id number] Procedure:                Colonoscopy Indications:              Screening for colorectal malignant neoplasm,                            Incidental change in bowel habits noted since                            cholecystectomy Medicines:                Monitored Anesthesia Care Procedure:                Pre-Anesthesia Assessment:                           - Prior to the procedure, a History and Physical                            was performed, and patient medications and                            allergies were reviewed. The patient's tolerance of                            previous anesthesia was also reviewed. The risks                            and benefits of the procedure and the sedation                            options and risks were discussed with the patient.                            All questions were answered, and informed consent                            was obtained. Prior Anticoagulants: The patient has                            taken no anticoagulant or antiplatelet agents. ASA                            Grade Assessment: II - A patient with mild systemic                            disease. After reviewing the risks and benefits,                            the patient was deemed in satisfactory condition to  undergo the procedure.                           After obtaining informed consent, the colonoscope                            was passed under direct vision. Throughout the                            procedure, the patient's blood pressure, pulse, and                            oxygen saturations were monitored continuously. The                            Olympus CF-[id number]) Colonoscope was                             introduced through the anus and advanced to the the                            cecum, identified by appendiceal orifice and                            ileocecal valve. The colonoscopy was performed                            without difficulty. The patient tolerated the                            procedure. The quality of the bowel preparation was                            good. The ileocecal valve, appendiceal orifice, and                            rectum were photographed. Scope In: 1:50:26 PM Scope Out: 2:03:47 PM Scope Withdrawal Time: 0 hours 8 minutes 11 seconds  Total Procedure Duration: 0 hours 13 minutes 21 seconds  Findings:                 The digital rectal exam findings include                            hemorrhoids. Pertinent negatives include no                            palpable rectal lesions.                           Multiple small-mouthed diverticula were found in                            the entire colon.  Normal mucosa was found in the entire colon.                            Biopsies for histology were taken with a cold                            forceps from the entire colon for evaluation of                            microscopic colitis.                           Non-bleeding non-thrombosed internal hemorrhoids                            were found during retroflexion, during perianal                            exam and during digital exam. The hemorrhoids were                            Grade II (internal hemorrhoids that prolapse but                            reduce spontaneously). Complications:            No immediate complications. Estimated Blood Loss:     Estimated blood loss was minimal. Impression:               - Hemorrhoids found on digital rectal exam.                           - Diverticulosis in the entire examined colon.                           - Normal mucosa in the entire examined  colon.                            Biopsied.                           - Non-bleeding non-thrombosed internal hemorrhoids. Recommendation:           - The patient will be observed post-procedure,                            until all discharge criteria are met.                           - Discharge patient to home.                           - Patient has a contact number available for                            emergencies. The signs and symptoms of potential  delayed complications were discussed with the                            patient. Return to normal activities tomorrow.                            Written discharge instructions were provided to the                            patient.                           - High fiber diet.                           - Transition from colestipol to cholestyramine (1                            to 2 packets daily as needed).                           - Continue present medications otherwise.                           - Await pathology results.                           - Repeat colonoscopy in 10 years for screening                            purposes.                           -Further evaluation of diarrhea if it persists with                            fecal elastase testing as well as SIBO breath                            testing should be considered if we have maxed out                            bile salt diarrhea treatment and no evidence of                            microscopic colitis.                           - The findings and recommendations were discussed                            with the patient.                           - The findings and recommendations were discussed  with the patient's family. Corliss Parish, MD 07/09/2023 2:15:09 PM

## 2023-07-09 NOTE — Progress Notes (Signed)
Called to room to assist during endoscopic procedure.  Patient ID and intended procedure confirmed with present staff. Received instructions for my participation in the procedure from the performing physician.  

## 2023-07-09 NOTE — Progress Notes (Signed)
Pt's states no medical or surgical changes since previsit or office visit. 

## 2023-07-09 NOTE — Op Note (Signed)
[facility] Patient Name: Janet Marquez Procedure Date: 07/09/2023 1:30 PM MRN: [id number] Endoscopist: Corliss Parish , MD, [id number] Age: 52 Referring MD:  Date of Birth: 1971/08/26 Gender: Female Account #: [id number] Procedure:                Upper GI endoscopy Indications:              Abdominal pain in the left upper quadrant,                            Dysphagia, Abdominal bloating Medicines:                Monitored Anesthesia Care Procedure:                Pre-Anesthesia Assessment:                           - Prior to the procedure, a History and Physical                            was performed, and patient medications and                            allergies were reviewed. The patient's tolerance of                            previous anesthesia was also reviewed. The risks                            and benefits of the procedure and the sedation                            options and risks were discussed with the patient.                            All questions were answered, and informed consent                            was obtained. Prior Anticoagulants: The patient has                            taken no anticoagulant or antiplatelet agents. ASA                            Grade Assessment: II - A patient with mild systemic                            disease. After reviewing the risks and benefits,                            the patient was deemed in satisfactory condition to                            undergo the procedure.  After obtaining informed consent, the endoscope was                            passed under direct vision. Throughout the                            procedure, the patient's blood pressure, pulse, and                            oxygen saturations were monitored continuously. The                            GIF [id number] #[id number] was introduced through the                            mouth, and advanced to the  second part of duodenum.                            The upper GI endoscopy was accomplished without                            difficulty. The patient tolerated the procedure. Scope In: Scope Out: Findings:                 No gross lesions were noted in the entire                            esophagus. Biopsies were taken with a cold forceps                            for histology. After the rest of the EGD was                            completed, a guidewire was placed and the scope was                            withdrawn. Dilation was performed with a Savary                            dilator with no resistance at 18 mm. The dilation                            site was examined following endoscope reinsertion                            and showed just below the UES, a mild mucosal                            disruption, mild improvement in luminal narrowing                            and no perforation.  The Z-line was irregular and was found 35 cm from                            the incisors.                           A 2 cm hiatal hernia was present.                           Localized mildly erythematous mucosa without                            bleeding was found in the gastric antrum.                           No other gross lesions were noted in the entire                            examined stomach. Biopsies were taken with a cold                            forceps for histology and Helicobacter pylori                            testing.                           No gross lesions were noted in the duodenal bulb,                            in the first portion of the duodenum and in the                            second portion of the duodenum. Biopsies were taken                            with a cold forceps for histology. Complications:            No immediate complications. Estimated Blood Loss:     Estimated blood loss was minimal. Impression:                - No gross lesions in the entire esophagus.                            Biopsied. Dilated up to 18 mm with mucosal wrent                            noted just below the UES..                           - Z-line irregular, 35 cm from the incisors.                           - 2 cm hiatal hernia.                           -  Erythematous mucosa in the antrum. No other gross                            lesions in the entire stomach. Biopsied.                           - No gross lesions in the duodenal bulb, in the                            first portion of the duodenum and in the second                            portion of the duodenum. Biopsied. Recommendation:           - Proceed to scheduled colonoscopy.                           - Continue present medications.                           - Await pathology results.                           - If dysphagia symptoms persist, then consider                            esophageal manometry.                           - The findings and recommendations were discussed                            with the patient.                           - The findings and recommendations were discussed                            with the patient's family. Corliss Parish, MD 07/09/2023 2:11:31 PM

## 2023-07-09 NOTE — Progress Notes (Signed)
Report to PACU, RN, vss, BBS= Clear.  

## 2023-07-10 ENCOUNTER — Telehealth: Payer: Self-pay

## 2023-07-10 NOTE — Telephone Encounter (Signed)
No answer, left message to call if having any issues or concerns, B.Lj Miyamoto RN 

## 2023-07-12 LAB — SURGICAL PATHOLOGY

## 2023-07-13 ENCOUNTER — Encounter: Payer: Self-pay | Admitting: Gastroenterology

## 2023-08-30 ENCOUNTER — Other Ambulatory Visit: Payer: Self-pay | Admitting: Family

## 2023-08-30 DIAGNOSIS — E1165 Type 2 diabetes mellitus with hyperglycemia: Secondary | ICD-10-CM

## 2023-09-16 ENCOUNTER — Other Ambulatory Visit: Payer: Self-pay | Admitting: Family

## 2023-09-16 DIAGNOSIS — F411 Generalized anxiety disorder: Secondary | ICD-10-CM

## 2023-09-20 ENCOUNTER — Ambulatory Visit (HOSPITAL_COMMUNITY): Payer: 59 | Attending: Cardiovascular Disease

## 2023-09-20 DIAGNOSIS — I340 Nonrheumatic mitral (valve) insufficiency: Secondary | ICD-10-CM | POA: Diagnosis present

## 2023-09-20 LAB — ECHOCARDIOGRAM COMPLETE
Area-P 1/2: 4.33 cm2
S' Lateral: 2 cm

## 2023-10-17 ENCOUNTER — Ambulatory Visit: Payer: 59 | Admitting: Gastroenterology

## 2023-11-22 ENCOUNTER — Encounter: Payer: Self-pay | Admitting: Adult Health

## 2023-11-22 ENCOUNTER — Ambulatory Visit (INDEPENDENT_AMBULATORY_CARE_PROVIDER_SITE_OTHER): Payer: 59 | Admitting: Adult Health

## 2023-11-22 VITALS — BP 122/86 | HR 88 | Temp 97.5°F | Resp 18 | Ht 66.0 in | Wt 167.8 lb

## 2023-11-22 DIAGNOSIS — F411 Generalized anxiety disorder: Secondary | ICD-10-CM

## 2023-11-22 DIAGNOSIS — I10 Essential (primary) hypertension: Secondary | ICD-10-CM | POA: Diagnosis not present

## 2023-11-22 DIAGNOSIS — K529 Noninfective gastroenteritis and colitis, unspecified: Secondary | ICD-10-CM

## 2023-11-22 DIAGNOSIS — K219 Gastro-esophageal reflux disease without esophagitis: Secondary | ICD-10-CM

## 2023-11-22 DIAGNOSIS — E1165 Type 2 diabetes mellitus with hyperglycemia: Secondary | ICD-10-CM

## 2023-11-22 DIAGNOSIS — E782 Mixed hyperlipidemia: Secondary | ICD-10-CM

## 2023-11-22 MED ORDER — HYDROCHLOROTHIAZIDE 12.5 MG PO TABS: 12.5000 mg | ORAL_TABLET | Freq: Every day | ORAL | 1 refills | Status: AC

## 2023-11-22 MED ORDER — LISINOPRIL 5 MG PO TABS: 5.0000 mg | ORAL_TABLET | Freq: Every day | ORAL | 3 refills | Status: DC

## 2023-11-22 MED ORDER — PANTOPRAZOLE SODIUM 40 MG PO TBEC
40.0000 mg | DELAYED_RELEASE_TABLET | Freq: Every day | ORAL | 1 refills | Status: AC
Start: 2023-11-22 — End: ?

## 2023-11-22 MED ORDER — CHOLESTYRAMINE LIGHT 4 G PO PACK
4.0000 g | PACK | Freq: Two times a day (BID) | ORAL | 6 refills | Status: AC | PRN
Start: 2023-11-22 — End: ?

## 2023-11-22 MED ORDER — METOPROLOL TARTRATE 100 MG PO TABS: 100.0000 mg | ORAL_TABLET | Freq: Two times a day (BID) | ORAL | 3 refills | Status: AC

## 2023-11-22 MED ORDER — FLUTICASONE PROPIONATE 50 MCG/ACT NA SUSP: 1.0000 | NASAL | 5 refills | Status: DC | PRN

## 2023-11-22 MED ORDER — MOUNJARO 2.5 MG/0.5ML ~~LOC~~ SOAJ: 2.5000 mg | SUBCUTANEOUS | 1 refills | Status: AC

## 2023-11-22 MED ORDER — ESCITALOPRAM OXALATE 10 MG PO TABS: 10.0000 mg | ORAL_TABLET | Freq: Every day | ORAL | 3 refills | Status: DC

## 2023-11-22 NOTE — Progress Notes (Signed)
 --------------------------------------                                                                                                                          [facility]  Provider:  Jereld Serum DNP  Code Status:  Full Code   Goals of Care:     03/13/2023    8:34 AM  Advanced Directives  Does Patient Have a Medical Advance Directive? No  Would patient like information on creating a medical advance directive? No - Patient declined     Chief Complaint  Patient presents with   Acute Visit    My Diabetes levels    Discussed the use of AI scribe software for clinical note transcription with the patient, who gave verbal consent to proceed.  HPI: Patient is a 53 y.o. female seen today for an acute visit for diabetes mellitus management.  Janet Marquez is a 53 year old female with diabetes who presents with rising blood sugar levels.  Over the past three to four months, she has experienced rising blood sugar levels, with recent readings between 180 to 200 mg/dL. Previously, her blood sugar levels were better controlled and had been decreasing. She attributes the recent increase to stress and family issues, including deaths in the family and starting a new job. She has been out of Mounjaro  for the past month, which she takes at a dose of 2.5 mg weekly, and she also takes metformin  500 mg twice daily. Her blood sugar levels began to rise even before she ran out of Mounjaro .  She experiences anxiety, which she manages with Lexapro  10 mg daily. Increased stress is due to recent family deaths and work-related pressures. She does not feel depressed but acknowledges being in grief. No fever or swelling in her feet. She feels 'always clogged' in her airways but does not have congestion or fever.  She has chronic diarrhea and stomach cramping, which she attributes to  having her gallbladder removed. She takes cholestyramine  to manage these symptoms. She reports having diarrhea about three times a day recently and notes that her appetite has been poor this week. She confirms regular bowel movements despite chronic diarrhea.  She takes pantoprazole  for acid reflux, which has improved, although she still experiences some stomach discomfort. She does not frequently experience heartburn.  She has a history of hypertension, for which she takes metoprolol  tartrate 100 mg twice daily, lisinopril  5 mg daily, and hydrochlorothiazide  12.5 mg daily. Her blood pressure is currently well-controlled.     Past Medical History:  Diagnosis Date   Acid reflux    Anemia    Anxiety    Complication of anesthesia    patient had lipomas in 2019 removed and has experienced itching since then intermittently   Diabetes mellitus (HCC)    type II   Gallstones    GERD (gastroesophageal reflux disease)    Hx of cardiovascular stress test    ETT-echo 3/16: Normal   Hyperlipidemia  Hypertension    Inflammatory bowel diseases (IBD)    Thyroid  disease    Vertigo     Past Surgical History:  Procedure Laterality Date   BILATERAL SALPINGECTOMY Left 01/20/2015   Procedure: LAPAROSCOPIC LEFT SALPINGECTOMY;  Surgeon: Lynwood Clubs, MD;  Location: [facility];  Service: Gynecology;  Laterality: Left;   CESAREAN SECTION  2006   CHOLECYSTECTOMY N/A 03/18/2023   Procedure: LAPAROSCOPIC CHOLECYSTECTOMY WITH INTRAOPERATIVE CHOLANGIOGRAM;  Surgeon: Curvin Deward MOULD, MD;  Location: [facility] OR;  Service: General;  Laterality: N/A;   LAPAROSCOPIC ASSISTED VAGINAL HYSTERECTOMY N/A 01/20/2015   Procedure: LAPAROSCOPIC ASSISTED VAGINAL HYSTERECTOMY;  Surgeon: Lynwood Clubs, MD;  Location: [facility];  Service: Gynecology;  Laterality: N/A;   LIPOMA EXCISION  2019   MYOMECTOMY     2002    Allergies  Allergen Reactions   Clarithromycin Nausea And Vomiting    Outpatient Encounter Medications as of  11/22/2023  Medication Sig   cholestyramine  light (PREVALITE ) 4 g packet Take 1 packet (4 g total) by mouth 2 (two) times daily as needed. Start by taking one packet daily for 1-2 weeks, then adjust if necessary   EQ ALL DAY ALLERGY RELIEF 10 MG tablet Take 1 tablet by mouth once daily   escitalopram  (LEXAPRO ) 10 MG tablet TAKE 1 TABLET BY MOUTH ONCE DAILY ---PT  NEEDS  APPPOINTMENT  BEFORE  ANYMORE  FUTURE  REFILLS   fluticasone  (FLONASE ) 50 MCG/ACT nasal spray Place 1 spray into both nostrils as needed for allergies or rhinitis.   hydrochlorothiazide  (HYDRODIURIL ) 12.5 MG tablet Take 1 tablet (12.5 mg total) by mouth daily.   lisinopril  (ZESTRIL ) 5 MG tablet Take 1 tablet by mouth once daily   metFORMIN  (GLUCOPHAGE ) 500 MG tablet Take 1 tablet (500 mg total) by mouth 2 (two) times daily with a meal.   metoprolol  tartrate (LOPRESSOR ) 100 MG tablet Take 1 tablet by mouth twice daily   MOUNJARO  2.5 MG/0.5ML Pen INJECT 2.5 MG SUBCUTANEOUSLY ONCE A WEEK   Olopatadine HCl (PATADAY) 0.7 % SOLN Place 1 drop into both eyes daily as needed (allergies).   pantoprazole  (PROTONIX ) 40 MG tablet Take 1 tablet (40 mg total) by mouth daily.   Polyethyl Glycol-Propyl Glycol (SYSTANE) 0.4-0.3 % SOLN Place 1 drop into both eyes in the morning, at noon, and at bedtime.   rosuvastatin  (CRESTOR ) 5 MG tablet Take 1 tablet (5 mg total) by mouth daily.   [DISCONTINUED] colestipol  (COLESTID ) 1 g tablet Take 2 tablets (2 g total) by mouth 2 (two) times daily. (Patient not taking: Reported on 07/09/2023)   No facility-administered encounter medications on file as of 11/22/2023.    Review of Systems:  Review of Systems  Constitutional:  Negative for appetite change, chills, fatigue and fever.  HENT:  Negative for congestion, hearing loss, rhinorrhea and sore throat.   Eyes: Negative.   Respiratory:  Negative for cough, shortness of breath and wheezing.   Cardiovascular:  Negative for chest pain, palpitations and leg  swelling.  Gastrointestinal:  Positive for diarrhea. Negative for abdominal pain, constipation, nausea and vomiting.  Genitourinary:  Negative for dysuria.  Musculoskeletal:  Negative for arthralgias, back pain and myalgias.  Skin:  Negative for color change, rash and wound.  Neurological:  Negative for dizziness, weakness and headaches.  Psychiatric/Behavioral:  Negative for behavioral problems. The patient is not nervous/anxious.     Health Maintenance  Topic Date Due   OPHTHALMOLOGY EXAM  Never done   HIV Screening  Never done   Hepatitis C  Screening  Never done   Cervical Cancer Screening (HPV/Pap Cotest)  12/07/2017   Diabetic kidney evaluation - Urine ACR  10/30/2022   FOOT EXAM  11/06/2022   INFLUENZA VACCINE  05/16/2023   COVID-19 Vaccine (7 - 2024-25 season) 06/16/2023   HEMOGLOBIN A1C  09/13/2023   Diabetic kidney evaluation - eGFR measurement  03/12/2024   MAMMOGRAM  05/22/2024   DTaP/Tdap/Td (5 - Td or Tdap) 08/27/2031   Colonoscopy  07/08/2033   Pneumococcal Vaccine 36-33 Years old  Completed   Zoster Vaccines- Shingrix  Completed   HPV VACCINES  Aged Out    Physical Exam: Vitals:   11/22/23 1400  BP: 122/86  Pulse: 88  Resp: 18  Temp: (!) 97.5 F (36.4 C)  SpO2: 98%  Weight: 167 lb 12.8 oz (76.1 kg)  Height: 5' 6 (1.676 m)   Body mass index is 27.08 kg/m. Physical Exam Constitutional:      Appearance: Normal appearance.  HENT:     Head: Normocephalic and atraumatic.     Nose: Nose normal.     Mouth/Throat:     Mouth: Mucous membranes are moist.  Eyes:     Conjunctiva/sclera: Conjunctivae normal.  Cardiovascular:     Rate and Rhythm: Normal rate and regular rhythm.  Pulmonary:     Effort: Pulmonary effort is normal.     Breath sounds: Normal breath sounds.  Abdominal:     General: Bowel sounds are normal.     Palpations: Abdomen is soft.  Musculoskeletal:        General: Normal range of motion.     Cervical back: Normal range of motion.   Skin:    General: Skin is warm and dry.  Neurological:     General: No focal deficit present.     Mental Status: She is alert and oriented to person, place, and time.  Psychiatric:        Mood and Affect: Mood normal.        Behavior: Behavior normal.        Thought Content: Thought content normal.        Judgment: Judgment normal.     Labs reviewed: Basic Metabolic Panel: Recent Labs    12/05/22 0822 03/13/23 0912  NA 139 138  K 4.0 4.3  CL 100 104  CO2 28 24  GLUCOSE 105* 131*  BUN 7 11  CREATININE 0.75 0.87  CALCIUM  9.7 9.8  TSH 3.80  --    Liver Function Tests: Recent Labs    12/05/22 0822 02/11/23 0724  AST 40* 20  ALT 40* 14  ALKPHOS  --  124*  BILITOT 0.3 0.3  PROT 7.5 7.1  ALBUMIN  --  4.5   No results for input(s): LIPASE, AMYLASE in the last 8760 hours. No results for input(s): AMMONIA in the last 8760 hours. CBC: Recent Labs    12/05/22 0822 03/13/23 0912  WBC 10.7 9.3  NEUTROABS 7,137  --   HGB 12.1 13.6  HCT 36.1 43.1  MCV 80.6 82.9  PLT 408* 363   Lipid Panel: Recent Labs    12/05/22 0822 02/11/23 0724  CHOL 205* 153  HDL 40* 31*  LDLCALC 140* 893*  TRIG 124 85  CHOLHDL 5.1* 4.9*   Lab Results  Component Value Date   HGBA1C 6.6 (H) 03/13/2023    Procedures since last visit: No results found.  Assessment/Plan  1. Type 2 diabetes mellitus with hyperglycemia, without long-term current use of insulin  (HCC) (Primary) -  Elevated home glucose readings (180-200) and non-adherence to Mounjaro  2.5mg  weekly for the past month. Patient reports stress and recent life events as potential contributing factors. -Resume Mounjaro  2.5mg  weekly. -Check HbA1c and CMP on next visit (11/28/2023). -Continue Metformin  500mg  twice daily. - tirzepatide  (MOUNJARO ) 2.5 MG/0.5ML Pen; Inject 2.5 mg into the skin once a week.  Dispense: 8 mL; Refill: 1 - Hemoglobin A1C; Future - CBC with Differential/Platelets; Future  2. Essential  hypertension -  Well controlled with Lisinopril  5mg  daily, hydrochlorothiazide  12.5 mg daily and Metoprolol  100mg  twice daily. -Continue current regimen. - hydrochlorothiazide  (HYDRODIURIL ) 12.5 MG tablet; Take 1 tablet (12.5 mg total) by mouth daily.  Dispense: 90 tablet; Refill: 1 - lisinopril  (ZESTRIL ) 5 MG tablet; Take 1 tablet (5 mg total) by mouth daily.  Dispense: 30 tablet; Refill: 3 - metoprolol  tartrate (LOPRESSOR ) 100 MG tablet; Take 1 tablet (100 mg total) by mouth 2 (two) times daily.  Dispense: 60 tablet; Refill: 3 - Complete Metabolic Panel with eGFR; Future  3. Generalized anxiety disorder -  Patient reports increased stress due to recent life events. Currently on Lexapro  10mg  daily. -Continue Lexapro  10mg  daily. - escitalopram  (LEXAPRO ) 10 MG tablet; Take 1 tablet (10 mg total) by mouth daily.  Dispense: 30 tablet; Refill: 3  4. Chronic diarrhea -  Patient reports improvement with Cholestyramine . History of gallbladder removal. -Continue Cholestyramine  as needed. - cholestyramine  light (PREVALITE ) 4 g packet; Take 1 packet (4 g total) by mouth 2 (two) times daily as needed. Start by taking one packet daily for 1-2 weeks, then adjust if necessary  Dispense: 60 packet; Refill: 6  5. Mixed hyperlipidemia -  Patient was previously on Rosuvastatin  but was taken off due to improvement. Last lipid panel was on 12/05/2020. -Check lipid panel on next visit (11/28/2023). - Lipid panel; Future  6. Gastroesophageal reflux disease without esophagitis -  stable - pantoprazole  (PROTONIX ) 40 MG tablet; Take 1 tablet (40 mg total) by mouth daily.  Dispense: 30 tablet; Refill: 1      Follow-up in 3 months or sooner if lab results warrant earlier visit.      Labs/tests ordered:   CBC, CMP, lipid panel and A1C   Luda Charbonneau Medina-Vargas, NP

## 2023-11-25 ENCOUNTER — Other Ambulatory Visit: Payer: Self-pay | Admitting: Adult Health

## 2023-11-25 ENCOUNTER — Telehealth: Payer: Self-pay | Admitting: *Deleted

## 2023-11-25 MED ORDER — FLUTICASONE PROPIONATE 50 MCG/ACT NA SUSP
1.0000 | Freq: Every day | NASAL | 5 refills | Status: AC | PRN
Start: 2023-11-25 — End: ?

## 2023-11-25 NOTE — Telephone Encounter (Signed)
 Medina-Vargas, Monina C, NP  You13 minutes ago (3:24 PM)    Sent eRX to pharmacy.

## 2023-11-25 NOTE — Telephone Encounter (Signed)
 [facility] called and stated that they needed Clarification on patient's Flonase  Rx sent to them on 11/22/2023.  Stated that they need a new Rx sent stating the Frequency One or Two Puffs.   Please send new Rx

## 2023-11-28 ENCOUNTER — Other Ambulatory Visit: Payer: 59

## 2023-11-28 DIAGNOSIS — E782 Mixed hyperlipidemia: Secondary | ICD-10-CM

## 2023-11-28 DIAGNOSIS — E1165 Type 2 diabetes mellitus with hyperglycemia: Secondary | ICD-10-CM

## 2023-11-28 DIAGNOSIS — I10 Essential (primary) hypertension: Secondary | ICD-10-CM

## 2023-11-29 LAB — CBC WITH DIFFERENTIAL/PLATELET
Absolute Lymphocytes: 3062 {cells}/uL (ref 850–3900)
Absolute Monocytes: 414 {cells}/uL (ref 200–950)
Basophils Absolute: 26 {cells}/uL (ref 0–200)
Basophils Relative: 0.3 %
Eosinophils Absolute: 44 {cells}/uL (ref 15–500)
Eosinophils Relative: 0.5 %
HCT: 39.7 % (ref 35.0–45.0)
Hemoglobin: 13.1 g/dL (ref 11.7–15.5)
MCH: 27 pg (ref 27.0–33.0)
MCHC: 33 g/dL (ref 32.0–36.0)
MCV: 81.7 fL (ref 80.0–100.0)
MPV: 9.6 fL (ref 7.5–12.5)
Monocytes Relative: 4.7 %
Neutro Abs: 5254 {cells}/uL (ref 1500–7800)
Neutrophils Relative %: 59.7 %
Platelets: 418 10*3/uL — ABNORMAL HIGH (ref 140–400)
RBC: 4.86 10*6/uL (ref 3.80–5.10)
RDW: 12.4 % (ref 11.0–15.0)
Total Lymphocyte: 34.8 %
WBC: 8.8 10*3/uL (ref 3.8–10.8)

## 2023-11-29 LAB — COMPLETE METABOLIC PANEL WITH GFR
AG Ratio: 1.8 (calc) (ref 1.0–2.5)
ALT: 8 U/L (ref 6–29)
AST: 16 U/L (ref 10–35)
Albumin: 4.9 g/dL (ref 3.6–5.1)
Alkaline phosphatase (APISO): 116 U/L (ref 37–153)
BUN: 11 mg/dL (ref 7–25)
CO2: 31 mmol/L (ref 20–32)
Calcium: 10.4 mg/dL (ref 8.6–10.4)
Chloride: 100 mmol/L (ref 98–110)
Creat: 0.96 mg/dL (ref 0.50–1.03)
Globulin: 2.8 g/dL (ref 1.9–3.7)
Glucose, Bld: 96 mg/dL (ref 65–99)
Potassium: 4.2 mmol/L (ref 3.5–5.3)
Sodium: 138 mmol/L (ref 135–146)
Total Bilirubin: 0.3 mg/dL (ref 0.2–1.2)
Total Protein: 7.7 g/dL (ref 6.1–8.1)
eGFR: 71 mL/min/{1.73_m2} (ref >=60)

## 2023-11-29 LAB — LIPID PANEL
Cholesterol: 198 mg/dL (ref <200)
HDL: 48 mg/dL — ABNORMAL LOW (ref >=50)
LDL Cholesterol (Calc): 133 mg/dL — ABNORMAL HIGH
Non-HDL Cholesterol (Calc): 150 mg/dL — ABNORMAL HIGH (ref <130)
Total CHOL/HDL Ratio: 4.1 (calc) (ref <5.0)
Triglycerides: 78 mg/dL (ref <150)

## 2023-11-29 LAB — HEMOGLOBIN A1C
Hgb A1c MFr Bld: 6.5 %{Hb} — ABNORMAL HIGH (ref <5.7)
Mean Plasma Glucose: 140 mg/dL
eAG (mmol/L): 7.7 mmol/L

## 2023-12-01 NOTE — Progress Notes (Signed)
-  No anemia -Electrolytes and liver enzymes normal -LDL 133, elevated (normal <130), will need to exercise for at least 150 minutes/week, low-fat diet and increase vegetable intake -A1c 6.5, down from 6.6 (taken 8 months ago)

## 2024-01-18 ENCOUNTER — Other Ambulatory Visit: Payer: Self-pay | Admitting: Family

## 2024-01-18 DIAGNOSIS — E1165 Type 2 diabetes mellitus with hyperglycemia: Secondary | ICD-10-CM

## 2024-01-20 ENCOUNTER — Other Ambulatory Visit: Payer: Self-pay | Admitting: *Deleted

## 2024-01-20 DIAGNOSIS — E1165 Type 2 diabetes mellitus with hyperglycemia: Secondary | ICD-10-CM

## 2024-01-20 MED ORDER — METFORMIN HCL 500 MG PO TABS: 500.0000 mg | ORAL_TABLET | Freq: Two times a day (BID) | ORAL | 1 refills | Status: AC

## 2024-01-20 NOTE — Telephone Encounter (Signed)
[facility] requested refill  

## 2024-02-21 ENCOUNTER — Ambulatory Visit: Payer: 59 | Admitting: Family

## 2024-02-27 ENCOUNTER — Other Ambulatory Visit (INDEPENDENT_AMBULATORY_CARE_PROVIDER_SITE_OTHER): Payer: Self-pay

## 2024-02-27 ENCOUNTER — Ambulatory Visit (INDEPENDENT_AMBULATORY_CARE_PROVIDER_SITE_OTHER): Admitting: Orthopedic Surgery

## 2024-02-27 DIAGNOSIS — M542 Cervicalgia: Secondary | ICD-10-CM

## 2024-02-27 MED ORDER — METHOCARBAMOL 500 MG PO TABS: 500.0000 mg | ORAL_TABLET | Freq: Four times a day (QID) | ORAL | 1 refills | Status: AC

## 2024-02-28 ENCOUNTER — Encounter: Payer: Self-pay | Admitting: Orthopedic Surgery
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
## 2024-03-09 ENCOUNTER — Other Ambulatory Visit: Payer: Self-pay | Admitting: Adult Health

## 2024-03-09 DIAGNOSIS — I10 Essential (primary) hypertension: Secondary | ICD-10-CM

## 2024-04-02 ENCOUNTER — Other Ambulatory Visit: Payer: Self-pay | Admitting: Adult Health

## 2024-04-02 ENCOUNTER — Ambulatory Visit: Admitting: Physician Assistant

## 2024-04-02 ENCOUNTER — Encounter: Payer: Self-pay | Admitting: Orthopedic Surgery

## 2024-04-02 DIAGNOSIS — F411 Generalized anxiety disorder: Secondary | ICD-10-CM

## 2024-04-02 DIAGNOSIS — M542 Cervicalgia: Secondary | ICD-10-CM
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
## 2024-04-05 ENCOUNTER — Other Ambulatory Visit: Payer: Self-pay | Admitting: Family

## 2024-04-05 DIAGNOSIS — I10 Essential (primary) hypertension: Secondary | ICD-10-CM

## 2024-04-28 ENCOUNTER — Other Ambulatory Visit: Payer: Self-pay | Admitting: Family

## 2024-04-28 DIAGNOSIS — F411 Generalized anxiety disorder: Secondary | ICD-10-CM

## 2024-04-28 NOTE — Telephone Encounter (Signed)
 Patient has request refill on medicaton Lexapro . Patient last refill dated 04/02/2024 with 30 tablets and zero refills. Medication pend and sent to Mast Man, NP for additional refills.

## 2024-05-09 ENCOUNTER — Other Ambulatory Visit: Payer: Self-pay | Admitting: Family

## 2024-05-09 DIAGNOSIS — I10 Essential (primary) hypertension: Secondary | ICD-10-CM

## 2024-05-11 ENCOUNTER — Ambulatory Visit: Admitting: Orthopedic Surgery

## 2024-06-07 ENCOUNTER — Other Ambulatory Visit: Payer: Self-pay | Admitting: Family

## 2024-06-07 DIAGNOSIS — I10 Essential (primary) hypertension: Secondary | ICD-10-CM

## 2024-07-04 ENCOUNTER — Other Ambulatory Visit: Payer: Self-pay | Admitting: Family

## 2024-07-04 DIAGNOSIS — I10 Essential (primary) hypertension: Secondary | ICD-10-CM

## 2024-08-17 ENCOUNTER — Encounter: Payer: Self-pay | Admitting: Radiology
